# Patient Record
Sex: Female | Born: 1990 | Race: Black or African American | Hispanic: No | Smoking: Never smoker
Health system: Southern US, Community
[De-identification: ages and names within clinical notes are randomized; demographics above are authoritative.]

## PROBLEM LIST (undated history)

## (undated) DIAGNOSIS — D689 Coagulation defect, unspecified: Secondary | ICD-10-CM

## (undated) DIAGNOSIS — D649 Anemia, unspecified: Secondary | ICD-10-CM

## (undated) DIAGNOSIS — D571 Sickle-cell disease without crisis: Secondary | ICD-10-CM

## (undated) HISTORY — DX: Coagulation defect, unspecified: D68.9

## (undated) HISTORY — DX: Anemia, unspecified: D64.9

## (undated) HISTORY — DX: Sickle-cell disease without crisis: D57.1

---

## 2011-04-26 HISTORY — PX: CHOLECYSTECTOMY: SHX55

## 2020-05-29 ENCOUNTER — Ambulatory Visit
Admission: EM | Admit: 2020-05-29 | Discharge: 2020-05-29 | Disposition: A | Discharge status: Home / Self Care | Payer: 59 | Attending: Sports Medicine | Admitting: Sports Medicine

## 2020-05-29 ENCOUNTER — Other Ambulatory Visit: Payer: Self-pay

## 2020-05-29 ENCOUNTER — Encounter: Payer: Self-pay | Admitting: Emergency Medicine

## 2020-05-29 ENCOUNTER — Ambulatory Visit
Admission: RE | Admit: 2020-05-29 | Discharge: 2020-05-29 | Disposition: A | Discharge status: Home / Self Care | Payer: 59 | Source: Ambulatory Visit | Attending: Sports Medicine | Admitting: Sports Medicine

## 2020-05-29 DIAGNOSIS — N200 Calculus of kidney: Secondary | ICD-10-CM | POA: Insufficient documentation

## 2020-05-29 DIAGNOSIS — N920 Excessive and frequent menstruation with regular cycle: Secondary | ICD-10-CM | POA: Insufficient documentation

## 2020-05-29 DIAGNOSIS — Z9049 Acquired absence of other specified parts of digestive tract: Secondary | ICD-10-CM | POA: Insufficient documentation

## 2020-05-29 DIAGNOSIS — D509 Iron deficiency anemia, unspecified: Secondary | ICD-10-CM

## 2020-05-29 DIAGNOSIS — N922 Excessive menstruation at puberty: Secondary | ICD-10-CM | POA: Insufficient documentation

## 2020-05-29 DIAGNOSIS — D219 Benign neoplasm of connective and other soft tissue, unspecified: Secondary | ICD-10-CM | POA: Insufficient documentation

## 2020-05-29 DIAGNOSIS — D259 Leiomyoma of uterus, unspecified: Secondary | ICD-10-CM | POA: Insufficient documentation

## 2020-05-29 DIAGNOSIS — R1031 Right lower quadrant pain: Secondary | ICD-10-CM | POA: Insufficient documentation

## 2020-05-29 LAB — CBC WITH DIFFERENTIAL/PLATELET
Abs Immature Granulocytes: 0.02 10*3/uL (ref 0.00–0.07)
Basophils Absolute: 0 10*3/uL (ref 0.0–0.1)
Basophils Relative: 1 %
Eosinophils Absolute: 0 10*3/uL (ref 0.0–0.5)
Eosinophils Relative: 1 %
HCT: 28.5 % — ABNORMAL LOW (ref 36.0–46.0)
Hemoglobin: 8.5 g/dL — ABNORMAL LOW (ref 12.0–15.0)
Immature Granulocytes: 0 %
Lymphocytes Relative: 30 %
Lymphs Abs: 2.3 10*3/uL (ref 0.7–4.0)
MCH: 19.5 pg — ABNORMAL LOW (ref 26.0–34.0)
MCHC: 29.8 g/dL — ABNORMAL LOW (ref 30.0–36.0)
MCV: 65.4 fL — ABNORMAL LOW (ref 80.0–100.0)
Monocytes Absolute: 0.4 10*3/uL (ref 0.1–1.0)
Monocytes Relative: 6 %
Neutro Abs: 5 10*3/uL (ref 1.7–7.7)
Neutrophils Relative %: 62 %
Platelets: 360 10*3/uL (ref 150–400)
RBC: 4.36 MIL/uL (ref 3.87–5.11)
RDW: 19.5 % — ABNORMAL HIGH (ref 11.5–15.5)
WBC: 7.8 10*3/uL (ref 4.0–10.5)
nRBC: 0 % (ref 0.0–0.2)

## 2020-05-29 LAB — COMPREHENSIVE METABOLIC PANEL
ALT: 14 U/L (ref 0–44)
AST: 13 U/L — ABNORMAL LOW (ref 15–41)
Albumin: 4.3 g/dL (ref 3.5–5.0)
Alkaline Phosphatase: 55 U/L (ref 38–126)
Anion gap: 9 (ref 5–15)
BUN: 14 mg/dL (ref 6–20)
CO2: 24 mmol/L (ref 22–32)
Calcium: 8.8 mg/dL — ABNORMAL LOW (ref 8.9–10.3)
Chloride: 102 mmol/L (ref 98–111)
Creatinine, Ser: 0.81 mg/dL (ref 0.44–1.00)
GFR, Estimated: >60 mL/min (ref >60)
Glucose, Bld: 99 mg/dL (ref 70–99)
Potassium: 3.9 mmol/L (ref 3.5–5.1)
Sodium: 135 mmol/L (ref 135–145)
Total Bilirubin: 0.5 mg/dL (ref 0.3–1.2)
Total Protein: 8.5 g/dL — ABNORMAL HIGH (ref 6.5–8.1)

## 2020-05-29 LAB — URINALYSIS, COMPLETE (UACMP) WITH MICROSCOPIC
Bacteria, UA: NONE SEEN
Bilirubin Urine: NEGATIVE
Glucose, UA: NEGATIVE mg/dL
Ketones, ur: NEGATIVE mg/dL
Leukocytes,Ua: NEGATIVE
Nitrite: NEGATIVE
Protein, ur: NEGATIVE mg/dL
Specific Gravity, Urine: 1.02 (ref 1.005–1.030)
WBC, UA: NONE SEEN WBC/hpf (ref 0–5)
pH: 6 (ref 5.0–8.0)

## 2020-05-29 LAB — PREGNANCY, URINE: Preg Test, Ur: NEGATIVE

## 2020-05-29 MED ORDER — IOHEXOL 300 MG/ML  SOLN
125.0000 mL | Freq: Once | INTRAMUSCULAR | Status: AC | PRN
  Administered 2020-05-29: 125 mL via INTRAVENOUS

## 2020-05-29 NOTE — ED Provider Notes (Signed)
[facility]    CSN: [id number] Arrival date & time: 05/29/20  1608      History   Chief Complaint Chief Complaint  Patient presents with  . Abdominal Pain    HPI Eileen Vargas is a 30 y.o. female.   Patient pleasant 30 year old female who presents for evaluation of 2 days of lower abdominal pain over the suprapubic area.  She reports that it began on Wednesday and has progressively worsened.  No nausea vomiting or diarrhea.  No fever shakes chills.  She has not sought out any medical attention.  She describes the abdominal pain is crampy.  It does not get better or worse with food.  She has had her gallbladder removed in the past.  She still has her appendix.  Her last menstrual period was 2 weeks ago.  She denies current pregnancy.  She denies any pain on urination, dysuria, hematuria, increased frequency or urgency.  She denies vaginal discharge.  She denies a history or current symptoms of an STD.  She recently relocated here from [place] and does not have a primary care physician.  She does not have a current OB/GYN.  She denies any significant medical history.  She reports no medications on a regular basis including birth control.  She does report having heavy periods in the past but they have recently become much heavier.  That said, her lower abdominal pain is not related to her menstrual cycle as she is currently midcycle.  She denies any COVID symptoms.  No red flag signs or symptoms are elicited on history.     History reviewed. No pertinent past medical history.  There are no problems to display for this patient.   Past Surgical History:  Procedure Laterality Date  . CHOLECYSTECTOMY      OB History   No obstetric history on file.      Home Medications    Prior to Admission medications   Not on File    Family History History reviewed. No pertinent family history.  Social History Social History   Tobacco Use  . Smoking status: Never  Smoker  . Smokeless tobacco: Never Used  Vaping Use  . Vaping Use: Never used  Substance Use Topics  . Alcohol use: Yes  . Drug use: Never     Allergies   Patient has no known allergies.   Review of Systems Review of Systems  Constitutional: Negative for activity change, appetite change, chills, fatigue and fever.  HENT: Negative.   Eyes: Negative.   Respiratory: Negative.   Cardiovascular: Negative.   Gastrointestinal: Positive for abdominal pain. Negative for blood in stool, constipation, diarrhea, nausea and vomiting.  Genitourinary: Negative for dysuria, flank pain, frequency, hematuria, pelvic pain, urgency, vaginal bleeding, vaginal discharge and vaginal pain.  Musculoskeletal: Negative.   Skin: Negative.   Neurological: Negative.   All other systems reviewed and are negative.    Physical Exam Triage Vital Signs ED Triage Vitals  Enc Vitals Group     BP 05/29/20 1626 129/83     Pulse Rate 05/29/20 1626 89     Resp 05/29/20 1626 14     Temp 05/29/20 1626 98.7 F (37.1 C)     Temp Source 05/29/20 1626 Oral     SpO2 05/29/20 1626 98 %     Weight 05/29/20 1623 240 lb (108.9 kg)     Height 05/29/20 1623 5\' 4"  (1.626 m)     Head Circumference --  Peak Flow --      Pain Score 05/29/20 1622 8     Pain Loc --      Pain Edu? --      Excl. in [place]? --    No data found.  Updated Vital Signs BP 129/83 (BP Location: Left Arm)   Pulse 89   Temp 98.7 F (37.1 C) (Oral)   Resp 14   Ht 5\' 4"  (1.626 m)   Wt 108.9 kg   LMP 05/15/2020 (Approximate)   SpO2 98%   BMI 41.20 kg/m   Visual Acuity Right Eye Distance:   Left Eye Distance:   Bilateral Distance:    Right Eye Near:   Left Eye Near:    Bilateral Near:     Physical Exam Vitals and nursing note reviewed.  Constitutional:      General: She is not in acute distress.    Appearance: She is well-developed. She is not ill-appearing or toxic-appearing.  HENT:     Head: Normocephalic and atraumatic.      Mouth/Throat:     Mouth: Mucous membranes are moist.     Pharynx: Oropharynx is clear. No pharyngeal swelling or oropharyngeal exudate.  Eyes:     Extraocular Movements: Extraocular movements intact.     Pupils: Pupils are equal, round, and reactive to light.  Cardiovascular:     Rate and Rhythm: Normal rate and regular rhythm.     Heart sounds: Normal heart sounds. No murmur heard. No friction rub. No gallop.   Pulmonary:     Breath sounds: No stridor. No wheezing, rhonchi or rales.  Abdominal:     General: Abdomen is scaphoid. A surgical scar is present. Bowel sounds are increased. There is no distension or abdominal bruit. There are no signs of injury.     Palpations: Abdomen is soft. There is no fluid wave, hepatomegaly or splenomegaly.     Tenderness: There is abdominal tenderness in the right lower quadrant, periumbilical area and suprapubic area. There is guarding and rebound. There is no right CVA tenderness or left CVA tenderness.  Skin:    General: Skin is warm and dry.     Capillary Refill: Capillary refill takes less than 2 seconds.     Coloration: Skin is not jaundiced.  Neurological:     General: No focal deficit present.     Mental Status: She is alert and oriented to person, place, and time.      UC Treatments / Results  Labs (all labs ordered are listed, but only abnormal results are displayed) Labs Reviewed  URINALYSIS, COMPLETE (UACMP) WITH MICROSCOPIC - Abnormal; Notable for the following components:      Result Value   Hgb urine dipstick TRACE (*)    All other components within normal limits  COMPREHENSIVE METABOLIC PANEL - Abnormal; Notable for the following components:   Calcium 8.8 (*)    Total Protein 8.5 (*)    AST 13 (*)    All other components within normal limits  CBC WITH DIFFERENTIAL/PLATELET - Abnormal; Notable for the following components:   Hemoglobin 8.5 (*)    HCT 28.5 (*)    MCV 65.4 (*)    MCH 19.5 (*)    MCHC 29.8 (*)    RDW 19.5  (*)    All other components within normal limits  PREGNANCY, URINE    EKG   Radiology CT ABDOMEN PELVIS W CONTRAST  Result Date: 05/29/2020 CLINICAL DATA:  Right lower quadrant pain EXAM: CT ABDOMEN  AND PELVIS WITH CONTRAST TECHNIQUE: Multidetector CT imaging of the abdomen and pelvis was performed using the standard protocol following bolus administration of intravenous contrast. CONTRAST:  113mL OMNIPAQUE IOHEXOL 300 MG/ML  SOLN COMPARISON:  None. FINDINGS: Lower chest: Lung bases are clear. No effusions. Heart is normal size. Hepatobiliary: No focal liver abnormality is seen. Status post cholecystectomy. No biliary dilatation. Pancreas: No focal abnormality or ductal dilatation. Spleen: No focal abnormality.  Normal size. Adrenals/Urinary Tract: 2 cm cyst off the upper pole of the left kidney. 5 mm stone in the lower pole of the left kidney. No hydronephrosis or ureteral stones. Adrenal glands and urinary bladder unremarkable. Stomach/Bowel: Normal appendix. Stomach, large and small bowel grossly unremarkable. Vascular/Lymphatic: No evidence of aneurysm or adenopathy. Reproductive: Fibroid uterus with largest fibroid measuring 4 cm. No adnexal masses. Other: No free fluid or free air. Musculoskeletal: No acute bony abnormality. IMPRESSION: Left lower pole nephrolithiasis. Normal appendix. Fibroid uterus. No acute findings. Electronically Signed   By: Rolm Baptise M.D.   On: 05/29/2020 20:12    Procedures Procedures (including critical care time)  Medications Ordered in UC Medications - No data to display  Initial Impression / Assessment and Plan / UC Course  I have reviewed the triage vital signs and the nursing notes.  Pertinent labs & imaging results that were available during my care of the patient were reviewed by me and considered in my medical decision making (see chart for details).  Clinical impression: Right lower quadrant pain that goes into the periumbilical area and over the  suprapubic region.  She has had symptoms for 2 days and have progressively worsened.  Treatment plan: 1.  The findings and treatment plan were discussed in detail with the patient.  Patient was in agreement. 2.  I recommended getting a CT scan to further delineate whether or not she has had appendicitis.  We will send her over the hospital for that.  I will call her later with her results. 3.  Her urinalysis is negative for a UTI. 4.  Did a pregnancy test which was also negative. 5.  Her CBC was positive for a significant microcytic anemia with H&H of 8.5 and 28.5 with a MCV of 65.4.  Patient is unaware of any underlying hematological issues.  I will refer her to hematology for further evaluation and management. 6.  Results of her CT scan did show a fibroid uterus.  She is having heavy periods in her history that have recently worsened.  Will refer to OB/GYN for further evaluation and management.  This may also explain her lower abdominal pain. 7.  I have recommended over-the-counter iron with increased fruits and vegetables and advised the patient that she could get constipated as well as having dark stools from the iron.  She voiced verbal understanding. 8.  She will also need a primary care physician and will see if we can have our referral service help her out with that as well. 9.  I will discharge her for now.  When I spoke to her about her results on the telephone and I indicated that if she did get worse she should seek out immediate medical attention in the emergency room and she voiced verbal understanding.    Final Clinical Impressions(s) / UC Diagnoses   Final diagnoses:  Right lower quadrant abdominal pain  Microcytic anemia  Fibroids  Excessive menstruation at puberty     Discharge Instructions     Please go to the hospital to have  your CT scan.  Please wait there until the CT department contact me and I advise you of your results and what the next steps are. There is an  educational handout on abdominal pain. Your urine test was negative for infection and pregnancy.  The pregnancy test was necessary for your CT scan. Your labs show you have significant anemia.  CT scan to rule out not only appendicitis but a GI bleed.  You may need to see hematology but after your CT scan I will advise.    ED Prescriptions    None     PDMP not reviewed this encounter.   Verda Cumins, MD 05/30/20 [phone]

## 2020-05-29 NOTE — Discharge Instructions (Addendum)
Please go to the hospital to have your CT scan.  Please wait there until the CT department contact me and I advise you of your results and what the next steps are. There is an educational handout on abdominal pain. Your urine test was negative for infection and pregnancy.  The pregnancy test was necessary for your CT scan. Your labs show you have significant anemia.  CT scan to rule out not only appendicitis but a GI bleed.  You may need to see hematology but after your CT scan I will advise.

## 2020-05-29 NOTE — ED Triage Notes (Signed)
Patient c/o lower abdominal pain that started on Wed.  Patient denies urinary symptoms.  Patient denies vaginal discharge.  Patient denies any N,V,D.  Patient denies cold symptoms.  Patient denies fevers.

## 2020-05-30 ENCOUNTER — Telehealth: Payer: Self-pay | Admitting: Sports Medicine

## 2020-05-30 NOTE — Telephone Encounter (Signed)
Called and discussed the results directly with the patient.  All questions were encouraged and answered. Recommended over-the-counter iron with increased fruits and fibers given her anemia.  Also recommended hematology referral given her anemia and her MCV of 65.  Also recommended OB/GYN referral given her heavy periods and a fibroid uterus.  Advised defer abdominal pain was to worsen in any way that she should go directly to the emergency room.  She voiced verbal understanding.

## 2020-06-01 NOTE — ED Notes (Signed)
Patient has been approved for CPT [id number]. Obtained via [employer] . Valid for 05/29/2020 through 11/28/2020. Authorization # [id number].

## 2020-06-06 DIAGNOSIS — D509 Iron deficiency anemia, unspecified: Secondary | ICD-10-CM | POA: Insufficient documentation

## 2020-06-06 NOTE — Progress Notes (Signed)
[place]  [phone] Fax:(336) [phone]  ID: Eileen Vargas OB: [birth year]  MR#: [id number]  JKK#:[id number]  Patient Care Team: Patient, No Pcp Per as PCP - General (General Practice)  CHIEF COMPLAINT: Microcytic anemia.  INTERVAL HISTORY: Patient is a 30 year old female who was noted to have a microcytic anemia on routine blood work.  She currently feels well and is asymptomatic.  She does not complain of any weakness or fatigue.  She has a good appetite and denies weight loss.  She has no chest pain, shortness of breath, cough, or hemoptysis.  She denies any nausea, vomiting, constipation, or diarrhea.  She has no melena or hematochezia.  She has no urinary complaints.  Patient feels at her baseline and offers no specific complaints today.  REVIEW OF SYSTEMS:   Review of Systems  Constitutional: Negative.  Negative for fever, malaise/fatigue and weight loss.  Respiratory: Negative.  Negative for cough, hemoptysis and shortness of breath.   Cardiovascular: Negative.  Negative for chest pain and leg swelling.  Gastrointestinal: Negative.  Negative for abdominal pain and blood in stool.  Genitourinary: Negative.  Negative for hematuria.  Musculoskeletal: Negative.  Negative for back pain.  Skin: Negative.  Negative for rash.  Neurological: Negative.  Negative for dizziness, focal weakness, weakness and headaches.  Psychiatric/Behavioral: Negative.  The patient is not nervous/anxious.     As per HPI. Otherwise, a complete review of systems is negative.  PAST MEDICAL HISTORY: History reviewed. No pertinent past medical history.  PAST SURGICAL HISTORY: Past Surgical History:  Procedure Laterality Date  . CHOLECYSTECTOMY      FAMILY HISTORY: Family History  Problem Relation Age of Onset  . Diabetes Maternal Grandmother   . Lung cancer Maternal Grandfather     ADVANCED DIRECTIVES (Y/N):  N  HEALTH MAINTENANCE: Social History   Tobacco Use   . Smoking status: Never Smoker  . Smokeless tobacco: Never Used  Vaping Use  . Vaping Use: Never used  Substance Use Topics  . Alcohol use: Yes  . Drug use: Never     Colonoscopy:  PAP:  Bone density:  Lipid panel:  No Known Allergies  No current outpatient medications on file.   No current facility-administered medications for this visit.    OBJECTIVE: Vitals:   06/11/20 1357  BP: 122/81  Pulse: 78  Resp: 16  Temp: (!) 96.8 F (36 C)     Body mass index is 41.88 kg/m.    ECOG FS:0 - Asymptomatic  General: Well-developed, well-nourished, no acute distress. Eyes: Pink conjunctiva, anicteric sclera. HEENT: Normocephalic, moist mucous membranes. Lungs: No audible wheezing or coughing. Heart: Regular rate and rhythm. Abdomen: Soft, nontender, no obvious distention. Musculoskeletal: No edema, cyanosis, or clubbing. Neuro: Alert, answering all questions appropriately. Cranial nerves grossly intact. Skin: No rashes or petechiae noted. Psych: Normal affect. Lymphatics: No cervical, calvicular, axillary or inguinal LAD.   LAB RESULTS:  Lab Results  Component Value Date   NA 135 05/29/2020   K 3.9 05/29/2020   CL 102 05/29/2020   CO2 24 05/29/2020   GLUCOSE 99 05/29/2020   BUN 14 05/29/2020   CREATININE 0.81 05/29/2020   CALCIUM 8.8 (L) 05/29/2020   PROT 8.5 (H) 05/29/2020   ALBUMIN 4.3 05/29/2020   AST 13 (L) 05/29/2020   ALT 14 05/29/2020   ALKPHOS 55 05/29/2020   BILITOT 0.5 05/29/2020   GFRNONAA >60 05/29/2020    Lab Results  Component Value Date   WBC 5.9 06/11/2020  NEUTROABS 5.0 05/29/2020   HGB 9.0 (L) 06/11/2020   HCT 30.5 (L) 06/11/2020   MCV 69.6 (L) 06/11/2020   PLT 317 06/11/2020   Lab Results  Component Value Date   IRON 19 (L) 06/11/2020   TIBC 449 06/11/2020   IRONPCTSAT 4 (L) 06/11/2020   Lab Results  Component Value Date   FERRITIN 8 (L) 06/11/2020     STUDIES: CT ABDOMEN PELVIS W CONTRAST  Result Date:  05/29/2020 CLINICAL DATA:  Right lower quadrant pain EXAM: CT ABDOMEN AND PELVIS WITH CONTRAST TECHNIQUE: Multidetector CT imaging of the abdomen and pelvis was performed using the standard protocol following bolus administration of intravenous contrast. CONTRAST:  180mL OMNIPAQUE IOHEXOL 300 MG/ML  SOLN COMPARISON:  None. FINDINGS: Lower chest: Lung bases are clear. No effusions. Heart is normal size. Hepatobiliary: No focal liver abnormality is seen. Status post cholecystectomy. No biliary dilatation. Pancreas: No focal abnormality or ductal dilatation. Spleen: No focal abnormality.  Normal size. Adrenals/Urinary Tract: 2 cm cyst off the upper pole of the left kidney. 5 mm stone in the lower pole of the left kidney. No hydronephrosis or ureteral stones. Adrenal glands and urinary bladder unremarkable. Stomach/Bowel: Normal appendix. Stomach, large and small bowel grossly unremarkable. Vascular/Lymphatic: No evidence of aneurysm or adenopathy. Reproductive: Fibroid uterus with largest fibroid measuring 4 cm. No adnexal masses. Other: No free fluid or free air. Musculoskeletal: No acute bony abnormality. IMPRESSION: Left lower pole nephrolithiasis. Normal appendix. Fibroid uterus. No acute findings. Electronically Signed   By: Rolm Baptise M.D.   On: 05/29/2020 20:12    ASSESSMENT: Microcytic anemia.  PLAN:    1. Microcytic anemia: Patient noted to have reduced hemoglobin and iron stores.  There is no evidence of hemolysis and B12 and folate are within normal limits.  Erythropoietin level is appropriately elevated.  Patient will return to clinic in 2 to 3 weeks for further evaluation and initiation of IV Venofer.  I spent a total of 45 minutes reviewing chart data, face-to-face evaluation with the patient, counseling and coordination of care as detailed above.   Patient expressed understanding and was in agreement with this plan. She also understands that She can call clinic at any time with any  questions, concerns, or complaints.   Cancer Staging No matching staging information was found for the patient.  Lloyd Huger, MD   06/13/2020 7:44 AM

## 2020-06-11 ENCOUNTER — Encounter: Payer: Self-pay | Admitting: Oncology

## 2020-06-11 ENCOUNTER — Inpatient Hospital Stay: Payer: 59 | Attending: Oncology | Admitting: Oncology

## 2020-06-11 ENCOUNTER — Inpatient Hospital Stay: Payer: 59

## 2020-06-11 DIAGNOSIS — Z801 Family history of malignant neoplasm of trachea, bronchus and lung: Secondary | ICD-10-CM | POA: Diagnosis not present

## 2020-06-11 DIAGNOSIS — D259 Leiomyoma of uterus, unspecified: Secondary | ICD-10-CM | POA: Diagnosis not present

## 2020-06-11 DIAGNOSIS — N200 Calculus of kidney: Secondary | ICD-10-CM | POA: Diagnosis not present

## 2020-06-11 DIAGNOSIS — D509 Iron deficiency anemia, unspecified: Secondary | ICD-10-CM | POA: Insufficient documentation

## 2020-06-11 LAB — FOLATE: Folate: 18.4 ng/mL (ref >5.9)

## 2020-06-11 LAB — RETICULOCYTES
Immature Retic Fract: 35.5 % — ABNORMAL HIGH (ref 2.3–15.9)
RBC.: 4.45 MIL/uL (ref 3.87–5.11)
Retic Count, Absolute: 158.4 10*3/uL (ref 19.0–186.0)
Retic Ct Pct: 3.6 % — ABNORMAL HIGH (ref 0.4–3.1)

## 2020-06-11 LAB — FERRITIN: Ferritin: 8 ng/mL — ABNORMAL LOW (ref 11–307)

## 2020-06-11 LAB — CBC
HCT: 30.5 % — ABNORMAL LOW (ref 36.0–46.0)
Hemoglobin: 9 g/dL — ABNORMAL LOW (ref 12.0–15.0)
MCH: 20.5 pg — ABNORMAL LOW (ref 26.0–34.0)
MCHC: 29.5 g/dL — ABNORMAL LOW (ref 30.0–36.0)
MCV: 69.6 fL — ABNORMAL LOW (ref 80.0–100.0)
Platelets: 317 10*3/uL (ref 150–400)
RBC: 4.38 MIL/uL (ref 3.87–5.11)
RDW: 23.8 % — ABNORMAL HIGH (ref 11.5–15.5)
WBC: 5.9 10*3/uL (ref 4.0–10.5)
nRBC: 0 % (ref 0.0–0.2)

## 2020-06-11 LAB — IRON AND TIBC
Iron: 19 ug/dL — ABNORMAL LOW (ref 28–170)
Saturation Ratios: 4 % — ABNORMAL LOW (ref 10.4–31.8)
TIBC: 449 ug/dL (ref 250–450)
UIBC: 430 ug/dL

## 2020-06-11 LAB — LACTATE DEHYDROGENASE: LDH: 118 U/L (ref 98–192)

## 2020-06-11 LAB — VITAMIN B12: Vitamin B-12: 469 pg/mL (ref 180–914)

## 2020-06-11 NOTE — Progress Notes (Signed)
Pt referred by Dr Drema Dallas for microcytic anemia.

## 2020-06-12 LAB — ERYTHROPOIETIN: Erythropoietin: 77.1 m[IU]/mL — ABNORMAL HIGH (ref 2.6–18.5)

## 2020-06-15 LAB — HGB SOLUBILITY: Hgb Solubility: POSITIVE — AB

## 2020-06-15 LAB — HGB FRACTIONATION CASCADE
Hgb A2: 2.3 % (ref 1.8–3.2)
Hgb A: 62.1 % — ABNORMAL LOW (ref 96.4–98.8)
Hgb F: 0 % (ref 0.0–2.0)
Hgb S: 35.6 % — ABNORMAL HIGH

## 2020-06-26 ENCOUNTER — Other Ambulatory Visit: Payer: Self-pay

## 2020-06-26 DIAGNOSIS — D509 Iron deficiency anemia, unspecified: Secondary | ICD-10-CM

## 2020-06-27 NOTE — Progress Notes (Signed)
[place]  Telephone:(336) [phone] Fax:(336) [phone]  ID: Eileen Vargas OB: October 20, 1990  MR#: [id number]  NGE#:[id number]  Patient Care Team: Patient, No Pcp Per as PCP - General (General Practice)  CHIEF COMPLAINT: Iron deficiency anemia.  INTERVAL HISTORY: Patient returns to clinic today for further evaluation, discussion of her laboratory results, and initiation of IV Venofer.  She continues to feel well and remains asymptomatic. She does not complain of any weakness or fatigue.  She has a good appetite and denies weight loss.  She has no chest pain, shortness of breath, cough, or hemoptysis.  She denies any nausea, vomiting, constipation, or diarrhea.  She has no melena or hematochezia.  She has no urinary complaints.  Patient offers no specific complaints today.  REVIEW OF SYSTEMS:   Review of Systems  Constitutional: Negative.  Negative for fever, malaise/fatigue and weight loss.  Respiratory: Negative.  Negative for cough, hemoptysis and shortness of breath.   Cardiovascular: Negative.  Negative for chest pain and leg swelling.  Gastrointestinal: Negative.  Negative for abdominal pain and blood in stool.  Genitourinary: Negative.  Negative for hematuria.  Musculoskeletal: Negative.  Negative for back pain.  Skin: Negative.  Negative for rash.  Neurological: Negative.  Negative for dizziness, focal weakness, weakness and headaches.  Psychiatric/Behavioral: Negative.  The patient is not nervous/anxious.     As per HPI. Otherwise, a complete review of systems is negative.  PAST MEDICAL HISTORY: History reviewed. No pertinent past medical history.  PAST SURGICAL HISTORY: Past Surgical History:  Procedure Laterality Date   CHOLECYSTECTOMY      FAMILY HISTORY: Family History  Problem Relation Age of Onset   Diabetes Maternal Grandmother    Lung cancer Maternal Grandfather     ADVANCED DIRECTIVES (Y/N):  N  HEALTH MAINTENANCE: Social History    Tobacco Use   Smoking status: Never Smoker   Smokeless tobacco: Never Used  Vaping Use   Vaping Use: Never used  Substance Use Topics   Alcohol use: Yes   Drug use: Never     Colonoscopy:  PAP:  Bone density:  Lipid panel:  No Known Allergies  No current outpatient medications on file.   No current facility-administered medications for this visit.    OBJECTIVE: Vitals:   06/30/20 1334  BP: 120/78  Pulse: 79  Resp: 18  Temp: 98 F (36.7 C)     Body mass index is 41.2 kg/m.    ECOG FS:0 - Asymptomatic  General: Well-developed, well-nourished, no acute distress. Eyes: Pink conjunctiva, anicteric sclera. HEENT: Normocephalic, moist mucous membranes. Lungs: No audible wheezing or coughing. Heart: Regular rate and rhythm. Abdomen: Soft, nontender, no obvious distention. Musculoskeletal: No edema, cyanosis, or clubbing. Neuro: Alert, answering all questions appropriately. Cranial nerves grossly intact. Skin: No rashes or petechiae noted. Psych: Normal affect.  LAB RESULTS:  Lab Results  Component Value Date   NA 135 05/29/2020   K 3.9 05/29/2020   CL 102 05/29/2020   CO2 24 05/29/2020   GLUCOSE 99 05/29/2020   BUN 14 05/29/2020   CREATININE 0.81 05/29/2020   CALCIUM 8.8 (L) 05/29/2020   PROT 8.5 (H) 05/29/2020   ALBUMIN 4.3 05/29/2020   AST 13 (L) 05/29/2020   ALT 14 05/29/2020   ALKPHOS 55 05/29/2020   BILITOT 0.5 05/29/2020   GFRNONAA >60 05/29/2020    Lab Results  Component Value Date   WBC 5.1 06/30/2020   NEUTROABS 2.3 06/30/2020   HGB 9.5 (L) 06/30/2020   HCT 31.0 (  L) 06/30/2020   MCV 72.6 (L) 06/30/2020   PLT 290 06/30/2020   Lab Results  Component Value Date   IRON 24 (L) 06/30/2020   TIBC 417 06/30/2020   IRONPCTSAT 6 (L) 06/30/2020   Lab Results  Component Value Date   FERRITIN 13 06/30/2020     STUDIES: No results found.  ASSESSMENT: Iron deficiency anemia.  PLAN:    1.  Iron deficiency anemia: Patient noted to  have reduced hemoglobin and iron stores.  All of her other laboratory work was either negative or within normal limits.  Proceed with 200 mg IV Venofer today.  Patient will then receive 4 infusions over the next several weeks.  Return to clinic in 4 months with repeat laboratory work, further evaluation, and consideration of additional treatment if needed. 2.  Sickle cell trait: No intervention needed.  She does not have any children at this time.  No intervention is needed.  Patient may have a mild baseline anemia secondary to this.  IV Venofer as above.  I spent a total of 30 minutes reviewing chart data, face-to-face evaluation with the patient, counseling and coordination of care as detailed above.   Patient expressed understanding and was in agreement with this plan. She also understands that She can call clinic at any time with any questions, concerns, or complaints.    Lloyd Huger, MD   07/01/2020 6:24 AM

## 2020-06-30 ENCOUNTER — Encounter: Payer: Self-pay | Admitting: Oncology

## 2020-06-30 ENCOUNTER — Inpatient Hospital Stay: Payer: 59 | Attending: Oncology

## 2020-06-30 ENCOUNTER — Inpatient Hospital Stay: Payer: 59

## 2020-06-30 ENCOUNTER — Inpatient Hospital Stay (HOSPITAL_BASED_OUTPATIENT_CLINIC_OR_DEPARTMENT_OTHER): Payer: 59 | Admitting: Oncology

## 2020-06-30 VITALS — BP 120/78 | HR 79 | Temp 98.0°F | Resp 18 | Wt 240.0 lb

## 2020-06-30 VITALS — BP 106/70 | HR 69 | Temp 97.5°F | Resp 18

## 2020-06-30 DIAGNOSIS — D509 Iron deficiency anemia, unspecified: Secondary | ICD-10-CM

## 2020-06-30 DIAGNOSIS — D573 Sickle-cell trait: Secondary | ICD-10-CM | POA: Insufficient documentation

## 2020-06-30 DIAGNOSIS — Z79899 Other long term (current) drug therapy: Secondary | ICD-10-CM | POA: Diagnosis not present

## 2020-06-30 LAB — CBC WITH DIFFERENTIAL/PLATELET
Abs Immature Granulocytes: 0.02 10*3/uL (ref 0.00–0.07)
Basophils Absolute: 0.1 10*3/uL (ref 0.0–0.1)
Basophils Relative: 1 %
Eosinophils Absolute: 0.1 10*3/uL (ref 0.0–0.5)
Eosinophils Relative: 1 %
HCT: 31 % — ABNORMAL LOW (ref 36.0–46.0)
Hemoglobin: 9.5 g/dL — ABNORMAL LOW (ref 12.0–15.0)
Immature Granulocytes: 0 %
Lymphocytes Relative: 44 %
Lymphs Abs: 2.2 10*3/uL (ref 0.7–4.0)
MCH: 22.2 pg — ABNORMAL LOW (ref 26.0–34.0)
MCHC: 30.6 g/dL (ref 30.0–36.0)
MCV: 72.6 fL — ABNORMAL LOW (ref 80.0–100.0)
Monocytes Absolute: 0.4 10*3/uL (ref 0.1–1.0)
Monocytes Relative: 8 %
Neutro Abs: 2.3 10*3/uL (ref 1.7–7.7)
Neutrophils Relative %: 46 %
Platelets: 290 10*3/uL (ref 150–400)
RBC: 4.27 MIL/uL (ref 3.87–5.11)
RDW: 24.2 % — ABNORMAL HIGH (ref 11.5–15.5)
WBC: 5.1 10*3/uL (ref 4.0–10.5)
nRBC: 0 % (ref 0.0–0.2)

## 2020-06-30 LAB — IRON AND TIBC
Iron: 24 ug/dL — ABNORMAL LOW (ref 28–170)
Saturation Ratios: 6 % — ABNORMAL LOW (ref 10.4–31.8)
TIBC: 417 ug/dL (ref 250–450)
UIBC: 393 ug/dL

## 2020-06-30 LAB — FERRITIN: Ferritin: 13 ng/mL (ref 11–307)

## 2020-06-30 MED ORDER — IRON SUCROSE 20 MG/ML IV SOLN
200.0000 mg | Freq: Once | INTRAVENOUS | Status: AC
  Administered 2020-06-30: 200 mg via INTRAVENOUS
  Filled 2020-06-30: qty 10

## 2020-06-30 MED ORDER — SODIUM CHLORIDE 0.9 % IV SOLN: 200.0000 mg | Freq: Once | INTRAVENOUS | Status: DC

## 2020-06-30 MED ORDER — SODIUM CHLORIDE 0.9 % IV SOLN
Freq: Once | INTRAVENOUS | Status: AC
  Filled 2020-06-30: qty 250

## 2020-06-30 NOTE — Progress Notes (Signed)
Pt tolerated infusion well. No s/s of distress or reaction noted. Pt and VS stable at discharge.  

## 2020-06-30 NOTE — Progress Notes (Signed)
Patient denies any concerns today.  

## 2020-07-01 DIAGNOSIS — D573 Sickle-cell trait: Secondary | ICD-10-CM | POA: Insufficient documentation

## 2020-07-06 ENCOUNTER — Inpatient Hospital Stay: Payer: 59

## 2020-07-06 VITALS — BP 117/70 | HR 70 | Temp 99.5°F | Resp 18

## 2020-07-06 DIAGNOSIS — D508 Other iron deficiency anemias: Secondary | ICD-10-CM

## 2020-07-06 DIAGNOSIS — D509 Iron deficiency anemia, unspecified: Secondary | ICD-10-CM | POA: Diagnosis not present

## 2020-07-06 MED ORDER — SODIUM CHLORIDE 0.9 % IV SOLN: 200.0000 mg | Freq: Once | INTRAVENOUS | Status: DC

## 2020-07-06 MED ORDER — IRON SUCROSE 20 MG/ML IV SOLN
200.0000 mg | Freq: Once | INTRAVENOUS | Status: AC
  Administered 2020-07-06: 200 mg via INTRAVENOUS
  Filled 2020-07-06: qty 10

## 2020-07-06 MED ORDER — SODIUM CHLORIDE 0.9 % IV SOLN
Freq: Once | INTRAVENOUS | Status: AC
  Filled 2020-07-06: qty 250

## 2020-07-13 ENCOUNTER — Ambulatory Visit: Payer: 59 | Admitting: Family Medicine

## 2020-07-13 ENCOUNTER — Encounter: Payer: Self-pay | Admitting: Family Medicine

## 2020-07-13 ENCOUNTER — Inpatient Hospital Stay: Payer: 59

## 2020-07-13 ENCOUNTER — Other Ambulatory Visit: Payer: Self-pay

## 2020-07-13 VITALS — BP 116/78 | HR 84 | Temp 98.3°F | Ht 64.0 in | Wt 266.0 lb

## 2020-07-13 VITALS — BP 144/83 | HR 76 | Temp 97.8°F

## 2020-07-13 DIAGNOSIS — D219 Benign neoplasm of connective and other soft tissue, unspecified: Secondary | ICD-10-CM | POA: Diagnosis not present

## 2020-07-13 DIAGNOSIS — Z0000 Encounter for general adult medical examination without abnormal findings: Secondary | ICD-10-CM

## 2020-07-13 DIAGNOSIS — K219 Gastro-esophageal reflux disease without esophagitis: Secondary | ICD-10-CM | POA: Diagnosis not present

## 2020-07-13 DIAGNOSIS — D259 Leiomyoma of uterus, unspecified: Secondary | ICD-10-CM | POA: Insufficient documentation

## 2020-07-13 DIAGNOSIS — Z6841 Body Mass Index (BMI) 40.0 and over, adult: Secondary | ICD-10-CM

## 2020-07-13 DIAGNOSIS — F419 Anxiety disorder, unspecified: Secondary | ICD-10-CM

## 2020-07-13 DIAGNOSIS — D508 Other iron deficiency anemias: Secondary | ICD-10-CM

## 2020-07-13 DIAGNOSIS — D509 Iron deficiency anemia, unspecified: Secondary | ICD-10-CM | POA: Diagnosis not present

## 2020-07-13 DIAGNOSIS — E66813 Obesity, class 3: Secondary | ICD-10-CM | POA: Insufficient documentation

## 2020-07-13 DIAGNOSIS — F32A Depression, unspecified: Secondary | ICD-10-CM

## 2020-07-13 MED ORDER — IRON SUCROSE 20 MG/ML IV SOLN
200.0000 mg | Freq: Once | INTRAVENOUS | Status: AC
  Administered 2020-07-13: 200 mg via INTRAVENOUS
  Filled 2020-07-13: qty 10

## 2020-07-13 MED ORDER — SODIUM CHLORIDE 0.9 % IV SOLN
Freq: Once | INTRAVENOUS | Status: AC
  Filled 2020-07-13: qty 250

## 2020-07-13 MED ORDER — SODIUM CHLORIDE 0.9 % IV SOLN: 200.0000 mg | Freq: Once | INTRAVENOUS | Status: DC

## 2020-07-13 MED ORDER — OMEPRAZOLE 20 MG PO CPDR: 20.0000 mg | DELAYED_RELEASE_CAPSULE | Freq: Every day | ORAL | 1 refills | Status: DC

## 2020-07-13 NOTE — Patient Instructions (Addendum)
- Dose omeprazole daily 30 minutes before food - Modify foods & caffeine intake as discussed - Obtain fasting blood work - Return in 1 month - Sport and exercise psychologist for questions Conn's Current Therapy 2021 (pp. 213-216). [place], [place]: Elsevier.">  Gastroesophageal Reflux Disease, Adult Gastroesophageal reflux (GER) happens when acid from the stomach flows up into the tube that connects the mouth and the stomach (esophagus). Normally, food travels down the esophagus and stays in the stomach to be digested. However, when a person has GER, food and stomach acid sometimes move back up into the esophagus. If this becomes a more serious problem, the person may be diagnosed with a disease called gastroesophageal reflux disease (GERD). GERD occurs when the reflux:  Happens often.  Causes frequent or severe symptoms.  Causes problems such as damage to the esophagus. When stomach acid comes in contact with the esophagus, the acid may cause inflammation in the esophagus. Over time, GERD may create small holes (ulcers) in the lining of the esophagus. What are the causes? This condition is caused by a problem with the muscle between the esophagus and the stomach (lower esophageal sphincter, or LES). Normally, the LES muscle closes after food passes through the esophagus to the stomach. When the LES is weakened or abnormal, it does not close properly, and that allows food and stomach acid to go back up into the esophagus. The LES can be weakened by certain dietary substances, medicines, and medical conditions, including:  Tobacco use.  Pregnancy.  Having a hiatal hernia.  Alcohol use.  Certain foods and beverages, such as coffee, chocolate, onions, and peppermint. What increases the risk? You are more likely to develop this condition if you:  Have an increased body weight.  Have a connective tissue disorder.  Take NSAIDs, such as ibuprofen. What are the signs or symptoms? Symptoms of this condition  include:  Heartburn.  Difficult or painful swallowing and the feeling of having a lump in the throat.  A bitter taste in the mouth.  Bad breath and having a large amount of saliva.  Having an upset or bloated stomach and belching.  Chest pain. Different conditions can cause chest pain. Make sure you see your health care provider if you experience chest pain.  Shortness of breath or wheezing.  Ongoing (chronic) cough or a nighttime cough.  Wearing away of tooth enamel.  Weight loss. How is this diagnosed? This condition may be diagnosed based on a medical history and a physical exam. To determine if you have mild or severe GERD, your health care provider may also monitor how you respond to treatment. You may also have tests, including:  A test to examine your stomach and esophagus with a small camera (endoscopy).  A test that measures the acidity level in your esophagus.  A test that measures how much pressure is on your esophagus.  A barium swallow or modified barium swallow test to show the shape, size, and functioning of your esophagus. How is this treated? Treatment for this condition may vary depending on how severe your symptoms are. Your health care provider may recommend:  Changes to your diet.  Medicine.  Surgery. The goal of treatment is to help relieve your symptoms and to prevent complications. Follow these instructions at home: Eating and drinking  Follow a diet as recommended by your health care provider. This may involve avoiding foods and drinks such as: ? Coffee and tea, with or without caffeine. ? Drinks that contain alcohol. ? Energy drinks and sports  drinks. ? Carbonated drinks or sodas. ? Chocolate and cocoa. ? Peppermint and mint flavorings. ? Garlic and onions. ? Horseradish. ? Spicy and acidic foods, including peppers, chili powder, curry powder, vinegar, hot sauces, and barbecue sauce. ? Citrus fruit juices and citrus fruits, such as  oranges, lemons, and limes. ? Tomato-based foods, such as red sauce, chili, salsa, and pizza with red sauce. ? Fried and fatty foods, such as donuts, french fries, potato chips, and high-fat dressings. ? High-fat meats, such as hot dogs and fatty cuts of red and white meats, such as rib eye steak, sausage, ham, and bacon. ? High-fat dairy items, such as whole milk, butter, and cream cheese.  Eat small, frequent meals instead of large meals.  Avoid drinking large amounts of liquid with your meals.  Avoid eating meals during the 2-3 hours before bedtime.  Avoid lying down right after you eat.  Do not exercise right after you eat.   Lifestyle  Do not use any products that contain nicotine or tobacco. These products include cigarettes, chewing tobacco, and vaping devices, such as e-cigarettes. If you need help quitting, ask your health care provider.  Try to reduce your stress by using methods such as yoga or meditation. If you need help reducing stress, ask your health care provider.  If you are overweight, reduce your weight to an amount that is healthy for you. Ask your health care provider for guidance about a safe weight loss goal.   General instructions  Pay attention to any changes in your symptoms.  Take over-the-counter and prescription medicines only as told by your health care provider. Do not take aspirin, ibuprofen, or other NSAIDs unless your health care provider told you to take these medicines.  Wear loose-fitting clothing. Do not wear anything tight around your waist that causes pressure on your abdomen.  Raise (elevate) the head of your bed about 6 inches (15 cm). You can use a wedge to do this.  Avoid bending over if this makes your symptoms worse.  Keep all follow-up visits. This is important. Contact a health care provider if:  You have: ? New symptoms. ? Unexplained weight loss. ? Difficulty swallowing or it hurts to swallow. ? Wheezing or a persistent  cough. ? A hoarse voice.  Your symptoms do not improve with treatment. Get help right away if:  You have sudden pain in your arms, neck, jaw, teeth, or back.  You suddenly feel sweaty, dizzy, or light-headed.  You have chest pain or shortness of breath.  You vomit and the vomit is green, yellow, or black, or it looks like blood or coffee grounds.  You faint.  You have stool that is red, bloody, or black.  You cannot swallow, drink, or eat. These symptoms may represent a serious problem that is an emergency. Do not wait to see if the symptoms will go away. Get medical help right away. Call your local emergency services (911 in the [place] Do not drive yourself to the hospital. Summary  Gastroesophageal reflux happens when acid from the stomach flows up into the esophagus. GERD is a disease in which the reflux happens often, causes frequent or severe symptoms, or causes problems such as damage to the esophagus.  Treatment for this condition may vary depending on how severe your symptoms are. Your health care provider may recommend diet and lifestyle changes, medicine, or surgery.  Contact a health care provider if you have new or worsening symptoms.  Take over-the-counter and prescription medicines  only as told by your health care provider. Do not take aspirin, ibuprofen, or other NSAIDs unless your health care provider told you to do so.  Keep all follow-up visits as told by your health care provider. This is important. This information is not intended to replace advice given to you by your health care provider. Make sure you discuss any questions you have with your health care provider. Document Revised: 10/21/2019 Document Reviewed: 10/21/2019 Elsevier Patient Education  [place].

## 2020-07-14 ENCOUNTER — Telehealth: Payer: Self-pay

## 2020-07-14 LAB — HEMOGLOBIN A1C
Est. average glucose Bld gHb Est-mCnc: 100 mg/dL
Hgb A1c MFr Bld: 5.1 % (ref 4.8–5.6)

## 2020-07-14 LAB — COMPREHENSIVE METABOLIC PANEL
ALT: 12 IU/L (ref 0–32)
AST: 13 IU/L (ref 0–40)
Albumin/Globulin Ratio: 1.5 (ref 1.2–2.2)
Albumin: 4.3 g/dL (ref 3.8–4.8)
Alkaline Phosphatase: 87 IU/L (ref 44–121)
BUN/Creatinine Ratio: 10 (ref 9–23)
BUN: 8 mg/dL (ref 6–20)
Bilirubin Total: <0.2 mg/dL (ref 0.0–1.2)
CO2: 20 mmol/L (ref 20–29)
Calcium: 9.5 mg/dL (ref 8.7–10.2)
Chloride: 103 mmol/L (ref 96–106)
Creatinine, Ser: 0.77 mg/dL (ref 0.57–1.00)
Globulin, Total: 2.9 g/dL (ref 1.5–4.5)
Glucose: 116 mg/dL — ABNORMAL HIGH (ref 65–99)
Potassium: 4.2 mmol/L (ref 3.5–5.2)
Sodium: 141 mmol/L (ref 134–144)
Total Protein: 7.2 g/dL (ref 6.0–8.5)
eGFR: 106 mL/min/{1.73_m2} (ref >59)

## 2020-07-14 LAB — LIPID PANEL
Chol/HDL Ratio: 3.7 ratio (ref 0.0–4.4)
Cholesterol, Total: 176 mg/dL (ref 100–199)
HDL: 47 mg/dL (ref >39)
LDL Chol Calc (NIH): 98 mg/dL (ref 0–99)
Triglycerides: 178 mg/dL — ABNORMAL HIGH (ref 0–149)
VLDL Cholesterol Cal: 31 mg/dL (ref 5–40)

## 2020-07-14 LAB — TSH: TSH: 1.77 u[IU]/mL (ref 0.450–4.500)

## 2020-07-14 LAB — LIPASE: Lipase: 19 U/L (ref 14–72)

## 2020-07-14 NOTE — Telephone Encounter (Signed)
Patient stated first and last name on voicemail.  Left detailed message regarding lab results.  Advised patient on voicemail that results are also viewable in her MyChart and to call or send a MyChart message if she has any questions regarding the labs.  Referrals pending for [facility].  For your information.

## 2020-07-14 NOTE — Assessment & Plan Note (Signed)
Patient with GAD-7 in normal range, PHQ positive.  I reviewed various treatment strategies to include possible cognitive behavioral therapy and/or pharmacotherapy.  At this stage she is wishing to pursue cognitive behavioral therapy and we have placed a referral accordingly.  We can follow-up on this issue on an as-needed basis.

## 2020-07-14 NOTE — Assessment & Plan Note (Signed)
>>  ASSESSMENT AND PLAN FOR FIBROID WRITTEN ON 07/14/2020  9:09 AM BY Jim Lundin J, MD  Patient with CT evidence of fibroid, she is also symptomatic, well woman check pending.  Referral to OB/GYN has been placed who will follow up for these issues.

## 2020-07-14 NOTE — Progress Notes (Addendum)
New Patient Office Visit  Subjective:  Patient ID: Eileen Vargas, female    DOB: 08-07-1990  Age: 30 y.o. MRN: [id number]  CC:  Chief Complaint  Patient presents with  . New Patient (Initial Visit)   Eileen Vargas is a 30 y.o. female who presents today for her Complete Annual Exam. She feels fairly well. She does not report exercising. She reports she is sleeping fairly well. No breast complaints reported.  Mammogram: never DEXA: never Pap smear: uncertain Colonoscopy: never  Immunization History  Administered Date(s) Administered  . Influenza-Unspecified 06/17/2020  . PFIZER Comirnaty(Gray Top)Covid-19 Tri-Sucrose Vaccine 11/09/2019, 11/30/2019, 06/17/2020    HPI Eileen Vargas presents for establishment of care, routine physical exam, but does bring up additional concerns such as ongoing reflux, new onset over the past 1 year, history of fibroid noted on CT abdomen pelvis from earlier this year during evaluation of right lower quadrant pain, as well as feelings of anxiety and depression.  Past Medical History:  Diagnosis Date  . Anemia     Past Surgical History:  Procedure Laterality Date  . CHOLECYSTECTOMY  2013    Family History  Problem Relation Age of Onset  . Diabetes Maternal Grandmother   . Lung cancer Maternal Grandfather   . Kidney disease Mother   . Clotting disorder Father   . Sickle cell trait Father     Social History   Socioeconomic History  . Marital status: Significant Other    Spouse name: Margart Sickles  . Number of children: 0  . Years of education: 91  . Highest education level: Associate degree: occupational, Hotel manager, or vocational program  Occupational History  . Not on file  Tobacco Use  . Smoking status: Never Smoker  . Smokeless tobacco: Never Used  Vaping Use  . Vaping Use: Never used  Substance and Sexual Activity  . Alcohol use: Yes    Alcohol/week: 2.0 standard drinks    Types: 1 Glasses of wine, 1 Shots of liquor per week   . Drug use: Never  . Sexual activity: Yes  Other Topics Concern  . Not on file  Social History Narrative  . Not on file   Social Determinants of Health   Financial Resource Strain: Not on file  Food Insecurity: Not on file  Transportation Needs: Not on file  Physical Activity: Not on file  Stress: Not on file  Social Connections: Not on file  Intimate Partner Violence: Not on file    ROS Review of Systems  Constitutional: Negative for activity change, appetite change, chills, fever and unexpected weight change.  HENT: Negative.   Eyes: Negative.   Respiratory: Negative for apnea, cough and shortness of breath.   Cardiovascular: Negative for chest pain, palpitations and leg swelling.  Gastrointestinal: Negative for abdominal pain, constipation, diarrhea, nausea and vomiting.       +reflux  Endocrine: Negative.   Genitourinary: Positive for menstrual problem. Negative for flank pain, hematuria and vaginal bleeding.  Musculoskeletal: Negative.   Skin: Negative.   Allergic/Immunologic: Negative.   Neurological: Negative.   Psychiatric/Behavioral: Positive for dysphoric mood and sleep disturbance. Negative for self-injury. The patient is nervous/anxious.     Objective:    CLINICAL DATA:  Right lower quadrant pain  EXAM: CT ABDOMEN AND PELVIS WITH CONTRAST  TECHNIQUE: Multidetector CT imaging of the abdomen and pelvis was performed using the standard protocol following bolus administration of intravenous contrast.  CONTRAST:  183mL OMNIPAQUE IOHEXOL 300 MG/ML  SOLN  COMPARISON:  None.  FINDINGS: Lower chest: Lung bases are clear. No effusions. Heart is normal size.  Hepatobiliary: No focal liver abnormality is seen. Status post cholecystectomy. No biliary dilatation.  Pancreas: No focal abnormality or ductal dilatation.  Spleen: No focal abnormality.  Normal size.  Adrenals/Urinary Tract: 2 cm cyst off the upper pole of the left kidney. 5 mm stone  in the lower pole of the left kidney. No hydronephrosis or ureteral stones. Adrenal glands and urinary bladder unremarkable.  Stomach/Bowel: Normal appendix. Stomach, large and small bowel grossly unremarkable.  Vascular/Lymphatic: No evidence of aneurysm or adenopathy.  Reproductive: Fibroid uterus with largest fibroid measuring 4 cm. No adnexal masses.  Other: No free fluid or free air.  Musculoskeletal: No acute bony abnormality.  IMPRESSION: Left lower pole nephrolithiasis.  Normal appendix.  Fibroid uterus.  No acute findings.   Electronically Signed   By: Rolm Baptise M.D.   On: 05/29/2020 20:12  Today's Vitals: BP 116/78 (BP Location: Left Arm, Patient Position: Sitting, Cuff Size: Normal) Comment (BP Location): forearm  Pulse 84   Temp 98.3 F (36.8 C) (Oral)   Ht 5\' 4"  (1.626 m)   Wt 266 lb (120.7 kg)   LMP 06/25/2020 (Exact Date)   SpO2 98%   BMI 45.66 kg/m   Physical Exam   Physical Exam General: Well Developed, well nourished, and in no acute distress.  Neuro: Alert and oriented x3, extra-ocular muscles intact, sensation grossly intact. Cranial nerves II through XII are intact, motor, sensory, and coordinative functions are grossly intact. HEENT: Normocephalic, atraumatic, pupils equal round reactive to light, neck supple, no masses, no lymphadenopathy, thyroid nonpalpable. Oropharynx, nasopharynx, external ear canals are unremarkable. Bilateral tympanic membranes benign +cerumen surrounding. Skin: Warm and dry, no rashes noted, +hirsutism.  Cardiac: Regular rate and rhythm, no murmurs, rubs, or gallops. Respiratory: Clear to auscultation bilaterally. Not using accessory muscles, speaking in full sentences.  Abdominal: Soft, nontender, nondistended, positive bowel sounds, no masses, no organomegaly.  Musculoskeletal: Shoulder, elbow, wrist, hip, knee, ankle stable, and with full range of motion.    Assessment & Plan:   Problem List Items  Addressed This Visit      Digestive   Gastroesophageal reflux disease without esophagitis    Patient with 1 year history of reflux, erosive component not noted, extensive discussion over dietary and lifestyle modifications to place, plan for Rx management with omeprazole 20 mg daily with plan to reassess symptomatic response in 1 month.  If still symptomatic can consider escalation of pharmacotherapy.  Patient is understanding and amenable to this plan.      Relevant Medications   omeprazole (PRILOSEC) 20 MG capsule   Other Relevant Orders   Lipase (Completed)     Other   Anxiety and depression    Patient with GAD-7 in normal range, PHQ positive.  I reviewed various treatment strategies to include possible cognitive behavioral therapy and/or pharmacotherapy.  At this stage she is wishing to pursue cognitive behavioral therapy and we have placed a referral accordingly.  We can follow-up on this issue on an as-needed basis.      Relevant Orders   Ambulatory referral to Psychiatry   Fibroid    Patient with CT evidence of fibroid, she is also symptomatic, well woman check pending.  Referral to OB/GYN has been placed who will follow up for these issues.      Relevant Orders   Ambulatory referral to Gynecology   Class 3 severe obesity due to excess calories with body mass index (  BMI) of 45.0 to 49.9 in adult [facility])   Relevant Orders   Comprehensive metabolic panel (Completed)   TSH (Completed)   Lipid panel (Completed)   Hemoglobin A1c (Completed)      Outpatient Encounter Medications as of 07/13/2020  Medication Sig  . ferrous sulfate 325 (65 FE) MG tablet Take 325 mg by mouth daily with breakfast.  . Iron Sucrose (VENOFER IV) Inject 200 mg into the vein every 7 (seven) days.  Marland Kitchen omeprazole (PRILOSEC) 20 MG capsule Take 1 capsule (20 mg total) by mouth daily.   No facility-administered encounter medications on file as of 07/13/2020.    Follow-up: Return in about 1 month (around  08/13/2020).   Montel Culver, MD

## 2020-07-14 NOTE — Assessment & Plan Note (Signed)
Patient with 1 year history of reflux, erosive component not noted, extensive discussion over dietary and lifestyle modifications to place, plan for Rx management with omeprazole 20 mg daily with plan to reassess symptomatic response in 1 month.  If still symptomatic can consider escalation of pharmacotherapy.  Patient is understanding and amenable to this plan.

## 2020-07-14 NOTE — Telephone Encounter (Signed)
-----   Message from Montel Culver, MD sent at 07/14/2020  8:45 AM EDT ----- Eileen Vargas, the labs that we ordered look good! The glucose was slightly elevated but that is expected if it was not done fasting. That being said, the hemoglobin A1C number (which shows the average blood sugar over the past 3 months) is totally normal, as is the thyroid.   Your triglycerides (one of the lipids checked is high at 178 (limit is 149) but the other numbers for cholesterol look good. Try avoiding sugars, refined carbohydrates, and alcohol if applicable, and add more omega-3 fatty acids to the diet.  I will see you in a month so we can discuss this further, contact for any questions between now and then.

## 2020-07-14 NOTE — Assessment & Plan Note (Signed)
Patient with CT evidence of fibroid, she is also symptomatic, well woman check pending.  Referral to OB/GYN has been placed who will follow up for these issues.

## 2020-07-15 ENCOUNTER — Encounter: Payer: Self-pay | Admitting: Family Medicine

## 2020-07-15 NOTE — Telephone Encounter (Signed)
Please review.  KP

## 2020-07-20 ENCOUNTER — Inpatient Hospital Stay: Payer: 59

## 2020-07-20 ENCOUNTER — Other Ambulatory Visit: Payer: Self-pay

## 2020-07-20 VITALS — BP 134/88 | HR 72 | Temp 99.0°F | Resp 18

## 2020-07-20 DIAGNOSIS — D508 Other iron deficiency anemias: Secondary | ICD-10-CM

## 2020-07-20 DIAGNOSIS — D509 Iron deficiency anemia, unspecified: Secondary | ICD-10-CM | POA: Diagnosis not present

## 2020-07-20 MED ORDER — SODIUM CHLORIDE 0.9 % IV SOLN: 200.0000 mg | Freq: Once | INTRAVENOUS | Status: DC

## 2020-07-20 MED ORDER — IRON SUCROSE 20 MG/ML IV SOLN
200.0000 mg | Freq: Once | INTRAVENOUS | Status: AC
  Administered 2020-07-20: 200 mg via INTRAVENOUS
  Filled 2020-07-20: qty 10

## 2020-07-20 MED ORDER — SODIUM CHLORIDE 0.9 % IV SOLN
Freq: Once | INTRAVENOUS | Status: AC
  Filled 2020-07-20: qty 250

## 2020-07-23 ENCOUNTER — Ambulatory Visit (INDEPENDENT_AMBULATORY_CARE_PROVIDER_SITE_OTHER): Payer: 59 | Admitting: Obstetrics and Gynecology

## 2020-07-23 ENCOUNTER — Other Ambulatory Visit (HOSPITAL_COMMUNITY)
Admission: RE | Admit: 2020-07-23 | Discharge: 2020-07-23 | Disposition: A | Discharge status: Home / Self Care | Payer: 59 | Source: Ambulatory Visit | Attending: Obstetrics and Gynecology | Admitting: Obstetrics and Gynecology

## 2020-07-23 ENCOUNTER — Encounter: Payer: Self-pay | Admitting: Obstetrics and Gynecology

## 2020-07-23 ENCOUNTER — Other Ambulatory Visit: Payer: Self-pay

## 2020-07-23 VITALS — BP 127/84 | HR 101 | Ht 64.0 in | Wt 249.0 lb

## 2020-07-23 DIAGNOSIS — N939 Abnormal uterine and vaginal bleeding, unspecified: Secondary | ICD-10-CM

## 2020-07-23 DIAGNOSIS — Z124 Encounter for screening for malignant neoplasm of cervix: Secondary | ICD-10-CM

## 2020-07-23 NOTE — Patient Instructions (Addendum)
Options 1) Do nothing 2) Birth control (pill, depo provera shot, or Mirena IUD) 3) Fibroid specific hormonal medicines (Oriahnn or Myfembree) 4) Depo Lupron injection for 6 months (causes menopause for 6 months shrinking fibroids) 5) Myomectomy - surgery to remove fibroid leave uterus (abdominal incision, needs to wait 1 year for pregnancy, pregnancies will need to be delivered via C-section following the procedure)  Options not compatible with future fertility 5) Uterine artery embolization 6) Hysterectom   Uterine Fibroids  Uterine fibroids, also called leiomyomas, are noncancerous (benign) tumors that can grow in the uterus. They can cause heavy menstrual bleeding and pain. Fibroids may also grow in the fallopian tubes, cervix, or tissues (ligaments) near the uterus. You may have one or many fibroids. Fibroids vary in size, weight, and where they grow in the uterus. Some can become quite large. Most fibroids do not require medical treatment. What are the causes? The cause of this condition is not known. What increases the risk? You are more likely to develop this condition if you:  Are in your 30s or 40s and have not gone through menopause.  Have a family history of this condition.  Are of African American descent.  Started your menstrual period at age 58 or younger.  Have never given birth.  Are overweight or obese. What are the signs or symptoms? Many women do not have any symptoms. Symptoms of this condition may include:  Heavy menstrual bleeding.  Bleeding between menstrual periods.  Pain and pressure in the pelvic area, between your hip bones.  Pain during sex.  Bladder problems, such as needing to urinate right away or more often than usual.  Inability to have children (infertility).  Failure to carry pregnancy to term (miscarriage). How is this diagnosed? This condition may be diagnosed based on:  Your symptoms and medical history.  A physical exam.  A  pelvic exam that includes feeling for any tumors.  Imaging tests, such as ultrasound or MRI. How is this treated? Treatment for this condition may include follow-up visits with your health care provider to monitor your fibroids for any changes. Other treatment may include:  Medicines, such as: ? Medicines to relieve pain, including aspirin and NSAIDs, such as ibuprofen or naproxen. ? Hormone therapy. Treatment may be given as a pill or an injection, or it may be inserted into the uterus using an intrauterine device (IUD).  Surgery that would do one of the following: ? Remove the fibroids (myomectomy). This may be recommended if fibroids affect your fertility and you want to become pregnant. ? Remove the uterus (hysterectomy). ? Block the blood supply to the fibroids (uterine artery embolization). This can cause them to shrink and die. Follow these instructions at home: Medicines  Take over-the-counter and prescription medicines only as told by your health care provider.  Ask your health care provider if you should take iron pills or eat more iron-rich foods, such as dark green, leafy vegetables. Heavy menstrual bleeding can cause low iron levels. Managing pain If directed, apply heat to your back or abdomen to reduce pain. Use the heat source that your health care provider recommends, such as a moist heat pack or a heating pad. To apply heat:  Place a towel between your skin and the heat source.  Leave the heat on for 20-30 minutes.  Remove the heat if your skin turns bright red. This is especially important if you are unable to feel pain, heat, or cold. You may have a greater risk  of getting burned.   General instructions  Pay close attention to your menstrual cycle. Tell your health care provider about any changes, such as: ? Heavier bleeding that requires you to change your pads or tampons more than usual. ? A change in the number of days that your menstrual period lasts. ? A  change in symptoms that come with your menstrual period, such as back pain or cramps in your abdomen.  Keep all follow-up visits. This is important, especially if your fibroids need to be monitored for any changes. Contact a health care provider if you:  Have pelvic pain, back pain, or cramps in your abdomen that do not get better with medicine or heat.  Develop new bleeding between menstrual periods.  Have increased bleeding during or between menstrual periods.  Feel more tired or weak than usual.  Feel light-headed. Get help right away if you:  Faint.  Have pelvic pain that suddenly gets worse.  Have severe vaginal bleeding that soaks a tampon or pad in 30 minutes or less. Summary  Uterine fibroids are noncancerous (benign) tumors that can develop in the uterus.  The exact cause of this condition is not known.  Most fibroids do not require medical treatment unless they affect your ability to have children (fertility).  Contact a health care provider if you have pelvic pain, back pain, or cramps in your abdomen that do not get better with medicines.  Get help right away if you faint, have pelvic pain that suddenly gets worse, or have severe vaginal bleeding. This information is not intended to replace advice given to you by your health care provider. Make sure you discuss any questions you have with your health care provider. Document Revised: 11/12/2019 Document Reviewed: 11/12/2019 Elsevier Patient Education  [place].

## 2020-07-23 NOTE — Progress Notes (Signed)
Gynecology Abnormal Uterine Bleeding Initial Evaluation   Chief Complaint:  Chief Complaint  Patient presents with  . Gynecologic Exam    [facility] referral for fibroids. RM 3    History of Present Illness:    Paitient is a 30 y.o. No obstetric history on file. who LMP was Patient's last menstrual period was 07/05/2020., presents today for a problem visit.  She complains of menorrhagia that  began several months ago and its severity is described as moderate.  The patient menstrual complaints are chronic present for the past 6 months.  Menses are regular but flow is heavier.  She does have some associated dysmenorrhea.  Is anemic based on prior labs which we discussed is likely compounded by her heavy menstrual bleeding.     Previous evaluation: CT A/P Prior Diagnosis: Uterine fibroids Previous Treatment: none.  LMP: Patient's last menstrual period was 07/05/2020.  Paramter Normal / Abnormal Prsent  Frequency Am[place]hea     Infrequent (>38 days)     Normal (?24 days ?38 days) X   Freequent (<24 days)    Duration Normal (?8 days) X   Prolonged (>8 days)    Regularity Regular (shortest to longest cycle variation ?7-9 days)* X   Irregular (shortest to longest cycle variation ?8-10days)*    Flow Volume Light    (Self reported) Normal     Heavy X      Intermenstrual Bleeding None X   Random     Cyclical early     Cyclical mid     Cyclical late        Unscheduled Bleeding  Not applicable X  (exogenous hormones) Absent     Present     FIGO AUB I System: *The available evidence suggests that, using these criteria, the normal range (shortest to longest) varies with age: 51-25 y of age, ?91 d; 36-41 y, ?7 d; and for 34-45 y, ?9 d    Review of Systems: Review of Systems  Constitutional: Negative.   Gastrointestinal: Negative.   Genitourinary: Negative.   Endo/Heme/Allergies: Does not bruise/bleed easily.    Past Medical History:  Patient Active Problem List    Diagnosis Date Noted  . Anxiety and depression 07/13/2020  . Gastroesophageal reflux disease without esophagitis 07/13/2020  . Fibroid 07/13/2020  . Class 3 severe obesity due to excess calories with body mass index (BMI) of 45.0 to 49.9 in adult [facility]) 07/13/2020  . Sickle cell trait ([place]) 07/01/2020  . Iron deficiency anemia 06/06/2020    Past Surgical History:  Past Surgical History:  Procedure Laterality Date  . CHOLECYSTECTOMY  2013    Obstetric History: No obstetric history on file.  Family History:  Family History  Problem Relation Age of Onset  . Diabetes Maternal Grandmother   . Lung cancer Maternal Grandfather   . Kidney disease Mother   . Clotting disorder Father   . Sickle cell trait Father     Social History:  Social History   Socioeconomic History  . Marital status: Significant Other    Spouse name: Margart Sickles  . Number of children: 0  . Years of education: 1  . Highest education level: Associate degree: occupational, Hotel manager, or vocational program  Occupational History  . Not on file  Tobacco Use  . Smoking status: Never Smoker  . Smokeless tobacco: Never Used  Vaping Use  . Vaping Use: Never used  Substance and Sexual Activity  . Alcohol use: Yes    Alcohol/week: 2.0  standard drinks    Types: 1 Glasses of wine, 1 Shots of liquor per week  . Drug use: Never  . Sexual activity: Yes    Birth control/protection: Condom  Other Topics Concern  . Not on file  Social History Narrative  . Not on file   Social Determinants of Health   Financial Resource Strain: Not on file  Food Insecurity: Not on file  Transportation Needs: Not on file  Physical Activity: Not on file  Stress: Not on file  Social Connections: Not on file  Intimate Partner Violence: Not on file    Allergies:  No Known Allergies  Medications: Prior to Admission medications   Medication Sig Start Date End Date Taking? Authorizing Provider  ferrous sulfate 325 (65 FE) MG  tablet Take 325 mg by mouth daily with breakfast.   Yes [provider]  omeprazole (PRILOSEC) 20 MG capsule Take 1 capsule (20 mg total) by mouth daily. 07/13/20  Yes Montel Culver, MD  Iron Sucrose (VENOFER IV) Inject 200 mg into the vein every 7 (seven) days.    [provider]    Physical Exam Blood pressure 127/84, pulse (!) 101, height 5\' 4"  (1.626 m), weight 249 lb (112.9 kg), last menstrual period 07/05/2020. Body mass index is 42.74 kg/m.  Patient's last menstrual period was 07/05/2020.  General: NAD, obese, appears stated age 35: normocephalic, anicteric Pulmonary: No increased work of breathing Abdomen: soft, non-tender, non-distended.  Genitourinary:  External: Normal external female genitalia.  Normal urethral meatus, normal Bartholin's and Skene's glands.    Vagina: Normal vaginal mucosa, no evidence of prolapse.    Cervix: Grossly normal in appearance, no bleeding  Uterus: Not significantly enlarged but exam limited by habitus, mobile, normal contour.  No CMT  Adnexa: ovaries non-enlarged, no adnexal masses  Rectal: deferred  Lymphatic: no evidence of inguinal lymphadenopathy Extremities: no edema, erythema, or tenderness Neurologic: Grossly intact Psychiatric: mood appropriate, affect full  Female chaperone present for pelvic portions of the physical exam  Assessment: 30 y.o. . with abnormal uterine bleeding  Plan: Problem List Items Addressed This Visit   None   Visit Diagnoses    Abnormal uterine bleeding    -  Primary   Screening for malignant neoplasm of cervix       Relevant Orders   Cytology - PAP (Completed)      1) Uterine firbroid.  Patient undecided on next management steps.  Will allow to consider and then asked to contact us.  In particular we stressed option compatible with future fertility. - pap  brought up to date  Options 1) Do nothing 2) Birth control (pill, depo provera shot, or Mirena IUD) 3) Fibroid specific  hormonal medicines (Oriahnn or Myfembree) 4) Depo Lupron injection for 6 months (causes menopause for 6 months shrinking fibroids) 5) Myomectomy - surgery to remove fibroid leave uterus (abdominal incision, needs to wait 1 year for pregnancy, pregnancies will need to be delivered via C-section following the procedure)  Options not compatible with future fertility 5) Uterine artery embolization 6) Hysterectom  2) A total of 23 minutes were spent in face-to-face contact with the patient during this encounter with over half of that time devoted to counseling and coordination of care.  3)  Return if symptoms worsen or fail to improve.   Malachy Mood, MD, [facility], [facility] 07/23/2020, 2:37 PM

## 2020-07-27 ENCOUNTER — Inpatient Hospital Stay: Payer: 59 | Attending: Oncology

## 2020-07-27 VITALS — BP 143/77 | HR 78 | Temp 97.2°F

## 2020-07-27 DIAGNOSIS — Z79899 Other long term (current) drug therapy: Secondary | ICD-10-CM | POA: Insufficient documentation

## 2020-07-27 DIAGNOSIS — D509 Iron deficiency anemia, unspecified: Secondary | ICD-10-CM | POA: Diagnosis present

## 2020-07-27 DIAGNOSIS — D508 Other iron deficiency anemias: Secondary | ICD-10-CM

## 2020-07-27 LAB — CYTOLOGY - PAP
Comment: NEGATIVE
Diagnosis: NEGATIVE
High risk HPV: NEGATIVE

## 2020-07-27 MED ORDER — SODIUM CHLORIDE 0.9 % IV SOLN
Freq: Once | INTRAVENOUS | Status: AC
  Filled 2020-07-27: qty 250

## 2020-07-27 MED ORDER — SODIUM CHLORIDE 0.9 % IV SOLN: 200.0000 mg | Freq: Once | INTRAVENOUS | Status: DC

## 2020-07-27 MED ORDER — IRON SUCROSE 20 MG/ML IV SOLN
200.0000 mg | Freq: Once | INTRAVENOUS | Status: AC
  Administered 2020-07-27: 200 mg via INTRAVENOUS
  Filled 2020-07-27: qty 10

## 2020-08-14 ENCOUNTER — Other Ambulatory Visit: Payer: Self-pay

## 2020-08-14 ENCOUNTER — Encounter: Payer: Self-pay | Admitting: Family Medicine

## 2020-08-14 ENCOUNTER — Ambulatory Visit: Payer: 59 | Admitting: Family Medicine

## 2020-08-14 VITALS — BP 122/84 | HR 67 | Temp 98.4°F | Ht 64.0 in | Wt 248.4 lb

## 2020-08-14 DIAGNOSIS — Z114 Encounter for screening for human immunodeficiency virus [HIV]: Secondary | ICD-10-CM

## 2020-08-14 DIAGNOSIS — K219 Gastro-esophageal reflux disease without esophagitis: Secondary | ICD-10-CM

## 2020-08-14 DIAGNOSIS — Z6841 Body Mass Index (BMI) 40.0 and over, adult: Secondary | ICD-10-CM

## 2020-08-14 DIAGNOSIS — F419 Anxiety disorder, unspecified: Secondary | ICD-10-CM

## 2020-08-14 DIAGNOSIS — Z1159 Encounter for screening for other viral diseases: Secondary | ICD-10-CM

## 2020-08-14 DIAGNOSIS — F32A Depression, unspecified: Secondary | ICD-10-CM

## 2020-08-14 MED ORDER — OMEPRAZOLE 20 MG PO CPDR: 20.0000 mg | DELAYED_RELEASE_CAPSULE | Freq: Every day | ORAL | 1 refills | Status: DC | PRN

## 2020-08-14 NOTE — Assessment & Plan Note (Signed)
Patient states that symptoms are dramatically improved and have essentially resolved following 2-3 days of administration of PPI.  That being said, she is also incorporated dietary/lifestyle modifications.  Given her excellent response I have advised a transition to as needed dosing, continued dietary/left L modifications, and have refilled her medication.  We can follow-up on this issue on an as-needed basis.

## 2020-08-14 NOTE — Assessment & Plan Note (Signed)
Patient has reassuring vitals beyond BMI, recent labs show elevated triglycerides.  I have reviewed the same with the patient as well as specific dietary and activity modifications.  She has already made similar plans with her partner inclusive of increased vegetables, decreased eating out, increased activity on a regular basis.  I have reinforced and commended patient on these interventions.  Plan for recheck of lipid profile and A1c in 6 months, formal visit at that time.

## 2020-08-14 NOTE — Progress Notes (Signed)
Primary Care / Sports Medicine Office Visit  Patient Information:  Patient ID: Eileen Vargas, female DOB: Jun 09, 1990 Age: 30 y.o. MRN: [id number]   Eileen Vargas is a pleasant 30 y.o. female presenting with the following:  Chief Complaint  Patient presents with  . Follow-up  . Gastroesophageal Reflux    Started on omeprazole 20 mg daily a month ago; patient tolerating well    Review of Systems pertinent details above   Patient Active Problem List   Diagnosis Date Noted  . Anxiety and depression 07/13/2020  . Gastroesophageal reflux disease without esophagitis 07/13/2020  . Fibroid 07/13/2020  . Class 3 severe obesity due to excess calories with body mass index (BMI) of 45.0 to 49.9 in adult [facility]) 07/13/2020  . Sickle cell trait ([place]) 07/01/2020  . Iron deficiency anemia 06/06/2020   Past Medical History:  Diagnosis Date  . Anemia    Outpatient Medications Prior to Visit  Medication Sig Dispense Refill  . ferrous sulfate 325 (65 FE) MG tablet Take 325 mg by mouth daily with breakfast.    . Iron Sucrose (VENOFER IV) Inject 200 mg into the vein every 7 (seven) days.    Marland Kitchen omeprazole (PRILOSEC) 20 MG capsule Take 1 capsule (20 mg total) by mouth daily. 30 capsule 1   No facility-administered medications prior to visit.    Past Surgical History:  Procedure Laterality Date  . CHOLECYSTECTOMY  2013   Social History   Socioeconomic History  . Marital status: Significant Other    Spouse name: Margart Sickles  . Number of children: 0  . Years of education: 30  . Highest education level: Associate degree: occupational, Hotel manager, or vocational program  Occupational History  . Not on file  Tobacco Use  . Smoking status: Never Smoker  . Smokeless tobacco: Never Used  Vaping Use  . Vaping Use: Never used  Substance and Sexual Activity  . Alcohol use: Yes    Alcohol/week: 2.0 standard drinks    Types: 1 Glasses of wine, 1 Shots of liquor per week  . Drug use: Never  .  Sexual activity: Yes    Birth control/protection: Condom  Other Topics Concern  . Not on file  Social History Narrative  . Not on file   Social Determinants of Health   Financial Resource Strain: Not on file  Food Insecurity: Not on file  Transportation Needs: Not on file  Physical Activity: Not on file  Stress: Not on file  Social Connections: Not on file  Intimate Partner Violence: Not on file   Family History  Problem Relation Age of Onset  . Diabetes Maternal Grandmother   . Lung cancer Maternal Grandfather   . Kidney disease Mother   . Clotting disorder Father   . Sickle cell trait Father    No Known Allergies  Vitals:   08/14/20 0929  BP: 122/84  Pulse: 67  Temp: 98.4 F (36.9 C)  SpO2: 98%   Vitals:   08/14/20 0929  Weight: 248 lb 6.4 oz (112.7 kg)  Height: 5\' 4"  (1.626 m)   Body mass index is 42.64 kg/m.  No results found.   Independent interpretation of notes and tests performed by another provider:   None  Procedures performed:   None  Pertinent History, Exam, Impression, and Recommendations:   Gastroesophageal reflux disease without esophagitis Patient states that symptoms are dramatically improved and have essentially resolved following 2-3 days of administration of PPI.  That being said, she is also incorporated  dietary/lifestyle modifications.  Given her excellent response I have advised a transition to as needed dosing, continued dietary/left L modifications, and have refilled her medication.  We can follow-up on this issue on an as-needed basis.  Class 3 severe obesity due to excess calories with body mass index (BMI) of 45.0 to 49.9 in adult [facility]) Patient has reassuring vitals beyond BMI, recent labs show elevated triglycerides.  I have reviewed the same with the patient as well as specific dietary and activity modifications.  She has already made similar plans with her partner inclusive of increased vegetables, decreased eating out,  increased activity on a regular basis.  I have reinforced and commended patient on these interventions.  Plan for recheck of lipid profile and A1c in 6 months, formal visit at that time.  Anxiety and depression Symptoms are stable, patient has not heard back from psychiatry following our referral at previous visit.  We have provided the number to the patient after contacting the the group today.  At their recommendation, patient is to contact them to expedite scheduling.  We can track this issue on an as-needed basis.    Orders & Medications Meds ordered this encounter  Medications  . omeprazole (PRILOSEC) 20 MG capsule    Sig: Take 1 capsule (20 mg total) by mouth daily as needed.    Dispense:  90 capsule    Refill:  1   Orders Placed This Encounter  Procedures  . Hepatitis C Antibody  . HIV antibody (with reflex)     Return in about 6 months (around 02/13/2021) for follow-up labs.     Montel Culver, MD   Primary Care Sports Medicine [place]

## 2020-08-14 NOTE — Addendum Note (Signed)
Addended by: Forbes Cellar on: 08/14/2020 10:38 AM   Modules accepted: Orders

## 2020-08-14 NOTE — Addendum Note (Signed)
Addended by: Montel Culver on: 08/14/2020 11:23 AM   Modules accepted: Level of Service

## 2020-08-14 NOTE — Assessment & Plan Note (Signed)
Symptoms are stable, patient has not heard back from psychiatry following our referral at previous visit.  We have provided the number to the patient after contacting the the group today.  At their recommendation, patient is to contact them to expedite scheduling.  We can track this issue on an as-needed basis.

## 2020-08-14 NOTE — Patient Instructions (Signed)
-   Obtain lab work - Diet and exercise changes as you are already doing and below:  Diet & Exercise Recommendations Dietary changes to include reducing saturated fats, sodium, avoiding red meat, fried, processed foods, full-fat dairy, baked goods, and sweets. Incorporate more fruits, vegetables, whole grains, and transition to more eggs and lean meats. Make realistic changes where you can and stick to it!  Physical activity should be focused on getting 150 to 300 minutes per week of moderate to vigorous activity (30 minutes of activity at least 5 days of the week at a level of intensity where you can carry on a conversation without being out of breath). Any increase in activity is beneficial to your health! . Evidence for the benefits of physical activity for weight gain, adiposity, and bone health exists for children as young as three years. . Increasing physical activity in older adults can help them maintain independence by reducing cognitive decline and falls. . Physical activity reduces symptoms of depression and anxiety and improves sleep quality.

## 2020-08-15 LAB — HIV ANTIBODY (ROUTINE TESTING W REFLEX): HIV Screen 4th Generation wRfx: NONREACTIVE

## 2020-08-15 LAB — HEPATITIS C ANTIBODY: Hep C Virus Ab: <0.1 s/co ratio (ref 0.0–0.9)

## 2020-08-17 ENCOUNTER — Ambulatory Visit: Payer: 59 | Admitting: Family Medicine

## 2020-09-10 ENCOUNTER — Other Ambulatory Visit: Payer: Self-pay | Admitting: Family Medicine

## 2020-09-10 DIAGNOSIS — K219 Gastro-esophageal reflux disease without esophagitis: Secondary | ICD-10-CM

## 2020-09-10 MED ORDER — ESOMEPRAZOLE MAGNESIUM 40 MG PO CPDR: 40.0000 mg | DELAYED_RELEASE_CAPSULE | Freq: Every day | ORAL | 3 refills | Status: DC | PRN

## 2020-09-10 NOTE — Telephone Encounter (Signed)
[facility] and spoke with [facility]. [facility] states that the prescription for Omeprazole sent to the pharmacy on 08/14/20 was received but will need prior authorization. Insurance will only cover 90 capsules in a one year period. Patient could be switched to Nexium or Pantoprazole as an alternative.

## 2020-09-10 NOTE — Telephone Encounter (Signed)
Please advise for pantoprazole or Nexium as an alternative due to omeprazole being non-formulary.

## 2020-09-14 ENCOUNTER — Encounter: Payer: Self-pay | Admitting: Oncology

## 2020-10-28 ENCOUNTER — Other Ambulatory Visit: Payer: Self-pay | Admitting: *Deleted

## 2020-10-28 ENCOUNTER — Encounter: Payer: Self-pay | Admitting: Oncology

## 2020-10-28 DIAGNOSIS — D508 Other iron deficiency anemias: Secondary | ICD-10-CM

## 2020-10-30 ENCOUNTER — Inpatient Hospital Stay: Payer: Self-pay | Attending: Nurse Practitioner

## 2020-10-30 DIAGNOSIS — Z79899 Other long term (current) drug therapy: Secondary | ICD-10-CM | POA: Insufficient documentation

## 2020-10-30 DIAGNOSIS — D508 Other iron deficiency anemias: Secondary | ICD-10-CM

## 2020-10-30 DIAGNOSIS — Z801 Family history of malignant neoplasm of trachea, bronchus and lung: Secondary | ICD-10-CM | POA: Insufficient documentation

## 2020-10-30 DIAGNOSIS — D509 Iron deficiency anemia, unspecified: Secondary | ICD-10-CM | POA: Insufficient documentation

## 2020-10-30 DIAGNOSIS — D573 Sickle-cell trait: Secondary | ICD-10-CM | POA: Insufficient documentation

## 2020-10-30 LAB — CBC WITH DIFFERENTIAL/PLATELET
Abs Immature Granulocytes: 0.01 10*3/uL (ref 0.00–0.07)
Basophils Absolute: 0 10*3/uL (ref 0.0–0.1)
Basophils Relative: 1 %
Eosinophils Absolute: 0 10*3/uL (ref 0.0–0.5)
Eosinophils Relative: 1 %
HCT: 35.1 % — ABNORMAL LOW (ref 36.0–46.0)
Hemoglobin: 11.8 g/dL — ABNORMAL LOW (ref 12.0–15.0)
Immature Granulocytes: 0 %
Lymphocytes Relative: 40 %
Lymphs Abs: 2.3 10*3/uL (ref 0.7–4.0)
MCH: 28.9 pg (ref 26.0–34.0)
MCHC: 33.6 g/dL (ref 30.0–36.0)
MCV: 85.8 fL (ref 80.0–100.0)
Monocytes Absolute: 0.4 10*3/uL (ref 0.1–1.0)
Monocytes Relative: 7 %
Neutro Abs: 2.9 10*3/uL (ref 1.7–7.7)
Neutrophils Relative %: 51 %
Platelets: 263 10*3/uL (ref 150–400)
RBC: 4.09 MIL/uL (ref 3.87–5.11)
RDW: 13.2 % (ref 11.5–15.5)
WBC: 5.6 10*3/uL (ref 4.0–10.5)
nRBC: 0 % (ref 0.0–0.2)

## 2020-10-30 LAB — FERRITIN: Ferritin: 42 ng/mL (ref 11–307)

## 2020-10-30 LAB — IRON AND TIBC
Iron: 54 ug/dL (ref 28–170)
Saturation Ratios: 13 % (ref 10.4–31.8)
TIBC: 402 ug/dL (ref 250–450)
UIBC: 348 ug/dL

## 2020-11-03 ENCOUNTER — Ambulatory Visit: Payer: 59

## 2020-11-03 ENCOUNTER — Ambulatory Visit: Payer: 59 | Admitting: Nurse Practitioner

## 2020-11-05 ENCOUNTER — Encounter: Payer: Self-pay | Admitting: Nurse Practitioner

## 2020-11-05 ENCOUNTER — Inpatient Hospital Stay: Payer: Self-pay

## 2020-11-05 ENCOUNTER — Inpatient Hospital Stay (HOSPITAL_BASED_OUTPATIENT_CLINIC_OR_DEPARTMENT_OTHER): Payer: Self-pay | Admitting: Nurse Practitioner

## 2020-11-05 VITALS — BP 137/92 | HR 74 | Temp 98.9°F | Resp 19 | Wt 251.0 lb

## 2020-11-05 DIAGNOSIS — D509 Iron deficiency anemia, unspecified: Secondary | ICD-10-CM

## 2020-11-05 DIAGNOSIS — D573 Sickle-cell trait: Secondary | ICD-10-CM

## 2020-11-05 NOTE — Progress Notes (Signed)
Anemia follow up. Possible IV iron today. Pt denies any weakness. Feeling well. Has heavy menses due to a fibroid.

## 2020-11-05 NOTE — Patient Instructions (Signed)
Happy Birthday!

## 2020-11-05 NOTE — Progress Notes (Signed)
[place]  [phone] [phone]) [phone]  ID: Eileen Vargas OB: 01-Jan-1991  MR#: [id number]  NIO#:[id number]  Patient Care Team: Montel Culver, MD as PCP - General (Family Medicine)  CHIEF COMPLAINT: Iron deficiency anemia.  INTERVAL HISTORY:   Patient returns to clinic today for further evaluation, discussion of her laboratory results, and initiation of IV Venofer.  She continues to feel well and remains asymptomatic. She does not complain of any weakness or fatigue.  She has a good appetite and denies weight loss.  She has no chest pain, shortness of breath, cough, or hemoptysis.  She denies any nausea, vomiting, constipation, or diarrhea.  She has no melena or hematochezia.  She has no urinary complaints.  Patient offers no specific complaints today.  REVIEW OF SYSTEMS:   Review of Systems  Constitutional: Negative.  Negative for fever, malaise/fatigue and weight loss.  Respiratory: Negative.  Negative for cough, hemoptysis and shortness of breath.   Cardiovascular: Negative.  Negative for chest pain and leg swelling.  Gastrointestinal: Negative.  Negative for abdominal pain and blood in stool.  Genitourinary: Negative.  Negative for hematuria.  Musculoskeletal: Negative.  Negative for back pain.  Skin: Negative.  Negative for rash.  Neurological: Negative.  Negative for dizziness, focal weakness, weakness and headaches.  Psychiatric/Behavioral: Negative.  The patient is not nervous/anxious.    As per HPI. Otherwise, a complete review of systems is negative.  PAST MEDICAL HISTORY: Past Medical History:  Diagnosis Date   Anemia     PAST SURGICAL HISTORY: Past Surgical History:  Procedure Laterality Date   CHOLECYSTECTOMY  2013    FAMILY HISTORY: Family History  Problem Relation Age of Onset   Diabetes Maternal Grandmother    Lung cancer Maternal Grandfather    Kidney disease Mother    Clotting disorder Father    Sickle cell trait  Father     ADVANCED DIRECTIVES (Y/N):  N  HEALTH MAINTENANCE: Social History   Tobacco Use   Smoking status: Never   Smokeless tobacco: Never  Vaping Use   Vaping Use: Never used  Substance Use Topics   Alcohol use: Yes    Alcohol/week: 2.0 standard drinks    Types: 1 Glasses of wine, 1 Shots of liquor per week   Drug use: Never     Colonoscopy:  PAP:  Bone density:  Lipid panel:  No Known Allergies  Current Outpatient Medications  Medication Sig Dispense Refill   esomeprazole (NEXIUM) 40 MG capsule Take 1 capsule (40 mg total) by mouth daily as needed. One tab by mouth at dinner time. 90 capsule 3   Iron Sucrose (VENOFER IV) Inject 200 mg into the vein every 7 (seven) days.     ferrous sulfate 325 (65 FE) MG tablet Take 325 mg by mouth daily with breakfast.     No current facility-administered medications for this visit.    OBJECTIVE: Vitals:   11/05/20 1358  BP: (!) 137/92  Pulse: 74  Resp: 19  Temp: 98.9 F (37.2 C)  SpO2: 100%     Body mass index is 43.08 kg/m.    ECOG FS:0 - Asymptomatic  General: Well-developed, well-nourished, no acute distress. Eyes: Pink conjunctiva, anicteric sclera. Lungs: Clear to auscultation bilaterally.  No audible wheezing or coughing Heart: Regular rate and rhythm.  Abdomen: Soft, nontender, nondistended.  Musculoskeletal: No edema, cyanosis, or clubbing. Neuro: Alert, answering all questions appropriately. Cranial nerves grossly intact. Skin: No rashes or petechiae noted. Psych: Normal affect.  LAB  RESULTS:  Lab Results  Component Value Date   NA 141 07/13/2020   K 4.2 07/13/2020   CL 103 07/13/2020   CO2 20 07/13/2020   GLUCOSE 116 (H) 07/13/2020   BUN 8 07/13/2020   CREATININE 0.77 07/13/2020   CALCIUM 9.5 07/13/2020   PROT 7.2 07/13/2020   ALBUMIN 4.3 07/13/2020   AST 13 07/13/2020   ALT 12 07/13/2020   ALKPHOS 87 07/13/2020   BILITOT <0.2 07/13/2020   GFRNONAA >60 05/29/2020    Lab Results   Component Value Date   WBC 5.6 10/30/2020   NEUTROABS 2.9 10/30/2020   HGB 11.8 (L) 10/30/2020   HCT 35.1 (L) 10/30/2020   MCV 85.8 10/30/2020   PLT 263 10/30/2020   Lab Results  Component Value Date   IRON 54 10/30/2020   TIBC 402 10/30/2020   IRONPCTSAT 13 10/30/2020   Lab Results  Component Value Date   FERRITIN 42 10/30/2020     STUDIES: No results found.  ASSESSMENT: Iron deficiency anemia.  PLAN:    1.  Iron deficiency anemia: Hemoglobin improved compared to previous.  Iron stores and saturation ratio improved as well.  Iron saturation of 13%.  Clinically patient's symptoms have improved.  Continues to have heavy menses which are likely etiology of her iron deficiency.  Hold GI work-up at this time.  Return to clinic in 3 months with repeat labs, further evaluation, and consideration of additional IV Venofer.  2.  Sickle cell trait: No intervention needed.  She does not have any children at this time.  No intervention is needed.  Patient may have a mild baseline anemia secondary to this.  Monitor  Patient expressed understanding and was in agreement with this plan. She also understands that She can call clinic at any time with any questions, concerns, or complaints.    Verlon Au, NP   11/05/2020 2:11 PM

## 2021-01-28 ENCOUNTER — Other Ambulatory Visit: Payer: Self-pay

## 2021-01-28 DIAGNOSIS — D509 Iron deficiency anemia, unspecified: Secondary | ICD-10-CM

## 2021-02-01 ENCOUNTER — Other Ambulatory Visit: Payer: Self-pay

## 2021-02-01 ENCOUNTER — Inpatient Hospital Stay: Payer: Self-pay | Attending: Oncology

## 2021-02-01 DIAGNOSIS — Z79899 Other long term (current) drug therapy: Secondary | ICD-10-CM | POA: Insufficient documentation

## 2021-02-01 DIAGNOSIS — Z801 Family history of malignant neoplasm of trachea, bronchus and lung: Secondary | ICD-10-CM | POA: Insufficient documentation

## 2021-02-01 DIAGNOSIS — D509 Iron deficiency anemia, unspecified: Secondary | ICD-10-CM | POA: Insufficient documentation

## 2021-02-01 DIAGNOSIS — D573 Sickle-cell trait: Secondary | ICD-10-CM | POA: Insufficient documentation

## 2021-02-01 LAB — CBC WITH DIFFERENTIAL/PLATELET
Abs Immature Granulocytes: 0.01 10*3/uL (ref 0.00–0.07)
Basophils Absolute: 0 10*3/uL (ref 0.0–0.1)
Basophils Relative: 1 %
Eosinophils Absolute: 0.1 10*3/uL (ref 0.0–0.5)
Eosinophils Relative: 1 %
HCT: 34.4 % — ABNORMAL LOW (ref 36.0–46.0)
Hemoglobin: 11.4 g/dL — ABNORMAL LOW (ref 12.0–15.0)
Immature Granulocytes: 0 %
Lymphocytes Relative: 41 %
Lymphs Abs: 2.4 10*3/uL (ref 0.7–4.0)
MCH: 27.1 pg (ref 26.0–34.0)
MCHC: 33.1 g/dL (ref 30.0–36.0)
MCV: 81.7 fL (ref 80.0–100.0)
Monocytes Absolute: 0.4 10*3/uL (ref 0.1–1.0)
Monocytes Relative: 7 %
Neutro Abs: 2.9 10*3/uL (ref 1.7–7.7)
Neutrophils Relative %: 50 %
Platelets: 305 10*3/uL (ref 150–400)
RBC: 4.21 MIL/uL (ref 3.87–5.11)
RDW: 13.6 % (ref 11.5–15.5)
WBC: 5.8 10*3/uL (ref 4.0–10.5)
nRBC: 0 % (ref 0.0–0.2)

## 2021-02-01 LAB — IRON AND TIBC
Iron: 50 ug/dL (ref 28–170)
Saturation Ratios: 13 % (ref 10.4–31.8)
TIBC: 377 ug/dL (ref 250–450)
UIBC: 327 ug/dL

## 2021-02-01 LAB — FERRITIN: Ferritin: 17 ng/mL (ref 11–307)

## 2021-02-05 ENCOUNTER — Ambulatory Visit: Payer: 59 | Admitting: Family Medicine

## 2021-02-08 ENCOUNTER — Encounter: Payer: Self-pay | Admitting: Family Medicine

## 2021-02-08 ENCOUNTER — Other Ambulatory Visit: Payer: Self-pay

## 2021-02-08 ENCOUNTER — Ambulatory Visit (INDEPENDENT_AMBULATORY_CARE_PROVIDER_SITE_OTHER): Payer: Self-pay | Admitting: Family Medicine

## 2021-02-08 VITALS — BP 124/88 | HR 64 | Ht 64.0 in | Wt 251.0 lb

## 2021-02-08 DIAGNOSIS — E785 Hyperlipidemia, unspecified: Secondary | ICD-10-CM | POA: Insufficient documentation

## 2021-02-08 DIAGNOSIS — E781 Pure hyperglyceridemia: Secondary | ICD-10-CM

## 2021-02-08 DIAGNOSIS — R7301 Impaired fasting glucose: Secondary | ICD-10-CM

## 2021-02-08 DIAGNOSIS — D508 Other iron deficiency anemias: Secondary | ICD-10-CM

## 2021-02-08 DIAGNOSIS — F32A Anxiety disorder, unspecified: Secondary | ICD-10-CM

## 2021-02-08 DIAGNOSIS — F419 Anxiety disorder, unspecified: Secondary | ICD-10-CM

## 2021-02-08 DIAGNOSIS — K219 Gastro-esophageal reflux disease without esophagitis: Secondary | ICD-10-CM

## 2021-02-08 DIAGNOSIS — Z6841 Body Mass Index (BMI) 40.0 and over, adult: Secondary | ICD-10-CM

## 2021-02-08 MED ORDER — CITALOPRAM HYDROBROMIDE 20 MG PO TABS: 20.0000 mg | ORAL_TABLET | Freq: Every day | ORAL | 2 refills | Status: DC

## 2021-02-08 MED ORDER — ESOMEPRAZOLE MAGNESIUM 40 MG PO CPDR: 40.0000 mg | DELAYED_RELEASE_CAPSULE | Freq: Every day | ORAL | 3 refills | Status: DC | PRN

## 2021-02-08 NOTE — Assessment & Plan Note (Signed)
Healthy lifestyle measures discussed, we will hold from repeat labs until patient has a chance to talk to patient assistance liaison.

## 2021-02-08 NOTE — Assessment & Plan Note (Signed)
See additional assessment(s) for plan details. 

## 2021-02-08 NOTE — Progress Notes (Signed)
[place]  Telephone:([phone] Fax:(336) [phone]  ID: Eileen Vargas OB: [birth year]  MR#: [id number]  WLS#:[id number]  Patient Care Team: Montel Culver, MD as PCP - General (Family Medicine)  CHIEF COMPLAINT: Iron deficiency anemia.  INTERVAL HISTORY: Patient returns to clinic today for repeat laboratory work, further evaluation, and consideration of additional IV Venofer. Patient reports that she has increased iron in her diet.  She has no neurologic complaints.  She denies any recent fevers or illnesses.  She has a good appetite and denies weight loss.  She denies any weakness or fatigue. She has no chest pain, shortness of breath, cough, or hemoptysis.  She denies any nausea, vomiting, constipation, or diarrhea.  She has no melena or hematochezia.  She has no urinary complaints.  Patient feels at her baseline offers no specific complaints today.  REVIEW OF SYSTEMS:   Review of Systems  Constitutional: Negative.  Negative for fever, malaise/fatigue and weight loss.  Respiratory: Negative.  Negative for cough, hemoptysis and shortness of breath.   Cardiovascular: Negative.  Negative for chest pain and leg swelling.  Gastrointestinal: Negative.  Negative for abdominal pain and blood in stool.  Genitourinary: Negative.  Negative for hematuria.  Musculoskeletal: Negative.  Negative for back pain.  Skin: Negative.  Negative for rash.  Neurological: Negative.  Negative for dizziness, focal weakness, weakness and headaches.  Psychiatric/Behavioral: Negative.  The patient is not nervous/anxious.    As per HPI. Otherwise, a complete review of systems is negative.  PAST MEDICAL HISTORY: No past medical history on file.  PAST SURGICAL HISTORY: Past Surgical History:  Procedure Laterality Date   CHOLECYSTECTOMY  2013    FAMILY HISTORY: Family History  Problem Relation Age of Onset   Diabetes Maternal Grandmother    Lung cancer Maternal Grandfather    Kidney  disease Mother    Clotting disorder Father    Sickle cell trait Father     ADVANCED DIRECTIVES (Y/N):  N  HEALTH MAINTENANCE: Social History   Tobacco Use   Smoking status: Never   Smokeless tobacco: Never  Vaping Use   Vaping Use: Never used  Substance Use Topics   Alcohol use: Yes    Alcohol/week: 2.0 standard drinks    Types: 1 Glasses of wine, 1 Shots of liquor per week   Drug use: Never     Colonoscopy:  PAP:  Bone density:  Lipid panel:  No Known Allergies  Current Outpatient Medications  Medication Sig Dispense Refill   citalopram (CELEXA) 20 MG tablet Take 1 tablet (20 mg total) by mouth daily. 30 tablet 2   esomeprazole (NEXIUM) 40 MG capsule Take 1 capsule (40 mg total) by mouth daily as needed. One tab by mouth at dinner time. 90 capsule 3   ferrous sulfate 325 (65 FE) MG tablet Take 325 mg by mouth daily with breakfast.     Iron Sucrose (VENOFER IV) Inject 200 mg into the vein every 7 (seven) days.     No current facility-administered medications for this visit.    OBJECTIVE: Vitals:   02/09/21 1252  BP: (!) 165/105  Pulse: 89  Resp: 18  Temp: 98.4 F (36.9 C)  SpO2: 97%     Body mass index is 43.7 kg/m.    ECOG FS:0 - Asymptomatic  General: Well-developed, well-nourished, no acute distress. Eyes: Pink conjunctiva, anicteric sclera. HEENT: Normocephalic, moist mucous membranes. Lungs: No audible wheezing or coughing. Heart: Regular rate and rhythm. Abdomen: Soft, nontender, no obvious distention. Musculoskeletal:  No edema, cyanosis, or clubbing. Neuro: Alert, answering all questions appropriately. Cranial nerves grossly intact. Skin: No rashes or petechiae noted. Psych: Normal affect.   LAB RESULTS:  Lab Results  Component Value Date   NA 141 07/13/2020   K 4.2 07/13/2020   CL 103 07/13/2020   CO2 20 07/13/2020   GLUCOSE 116 (H) 07/13/2020   BUN 8 07/13/2020   CREATININE 0.77 07/13/2020   CALCIUM 9.5 07/13/2020   PROT 7.2  07/13/2020   ALBUMIN 4.3 07/13/2020   AST 13 07/13/2020   ALT 12 07/13/2020   ALKPHOS 87 07/13/2020   BILITOT <0.2 07/13/2020   GFRNONAA >60 05/29/2020    Lab Results  Component Value Date   WBC 5.8 02/01/2021   NEUTROABS 2.9 02/01/2021   HGB 11.4 (L) 02/01/2021   HCT 34.4 (L) 02/01/2021   MCV 81.7 02/01/2021   PLT 305 02/01/2021   Lab Results  Component Value Date   IRON 50 02/01/2021   TIBC 377 02/01/2021   IRONPCTSAT 13 02/01/2021   Lab Results  Component Value Date   FERRITIN 17 02/01/2021     STUDIES: No results found.  ASSESSMENT: Iron deficiency anemia.  PLAN:    1.  Iron deficiency anemia: Patient's hemoglobin has improved and is now nearly within normal limits at 11.4.  Her iron stores continue to be within normal limits.  Previously, all of her other laboratory work was either negative or within normal limits.  She does not require additional IV Venofer today.  Patient last received treatment on July 27, 2020.  Return to clinic in 6 months with repeat laboratory work and further evaluation.  If she does not require treatment at this time, patient can likely be discharged from clinic.   2.  Sickle cell trait: No intervention needed.  She does not have any children at this time.  Likely the etiology of her mild baseline anemia.     I spent a total of 20 minutes reviewing chart data, face-to-face evaluation with the patient, counseling and coordination of care as detailed above.   Patient expressed understanding and was in agreement with this plan. She also understands that She can call clinic at any time with any questions, concerns, or complaints.    Lloyd Huger, MD   02/09/2021 6:14 PM

## 2021-02-08 NOTE — Assessment & Plan Note (Signed)
Depression screen [facility] 2/9 02/08/2021 08/14/2020 07/13/2020  Decreased Interest 0 2 2  Down, Depressed, Hopeless 2 2 2   PHQ - 2 Score 2 4 4   Altered sleeping 3 2 2   Tired, decreased energy 2 1 1   Change in appetite 2 2 2   Feeling bad or failure about yourself  3 2 2   Trouble concentrating 0 1 1  Moving slowly or fidgety/restless 0 0 0  Suicidal thoughts 0 0 0  PHQ-9 Score 12 12 12   Difficult doing work/chores Very difficult Somewhat difficult Somewhat difficult   GAD 7 : Generalized Anxiety Score 02/08/2021 08/14/2020 07/13/2020  Nervous, Anxious, on Edge 0 1 1  Control/stop worrying 1 3 3   Worry too much - different things 1 2 2   Trouble relaxing 0 1 1  Restless 0 0 0  Easily annoyed or irritable 0 1 1  Afraid - awful might happen 0 0 0  Total GAD 7 Score 2 8 8   Anxiety Difficulty Somewhat difficult Somewhat difficult Somewhat difficult   Recently noted elevated PHQ scores and recently decreased GAD scores in the setting of significant life stressors including job change and loss of family member.  We had previously placed referral to psychiatry and discussed medication management.  She is amenable to consideration of medication management, Celexa 20 mg prescribed for daily dosing.  If she chooses to initiate this, she was advised to contact her office so we can coordinate a follow-up in 4 weeks time, this can be virtual.  Additionally, I have advised mindfulness and exercise therapy, and will attempt to follow-up with psychiatry pending insurance circumstances.  I have given her information for our patient assistance liaison.

## 2021-02-08 NOTE — Assessment & Plan Note (Signed)
Chronic GERD that is adequately controlled with esomeprazole 40 mg on a daily as needed basis.

## 2021-02-08 NOTE — Patient Instructions (Addendum)
- Continue esomeprazole - Can start citalopram (Celexa) and dose once daily, contact our office if starting - Please review information provided - Can contact Patient Assistance: Alm Bustard [phone]  Mindfulness-Based Stress Reduction Mindfulness-based stress reduction (MBSR) is a program that helps you learn to practice mindfulness. Mindfulness is the practice of intentionally paying attention to the present moment. MBSR focuses on developing self-awareness, which allows you to respond to life stress without judgment or negative emotions. It can be learned and practiced through techniques such as education, breathing exercises, meditation, and yoga. MBSR includes several mindfulness techniques in one program. MBSR works best when you understand the treatment, are willing to try new things, and can commit to spending time practicing what you learn.  What are the Benefits of MBSR? MBSR has been shown to have many benefits, which include helping you to: Reduce negative emotions, such as depression and anxiety. Improve memory and focus. Change how you sense and approach pain. Boost your body's ability to fight infections. Help you connect better with other people. Improve your sense of well-being. Follow These Instructions at Home:  Find a local in-person or online MBSR program. Set aside some time regularly for mindfulness practice. Find a mindfulness practice that works best for you. This may include one or more of the following: Meditation. Meditation involves focusing your mind on a certain thought or activity. Breathing awareness exercises. These help you to stay present by focusing on your breath. Body scan. For this practice, you lie down and pay attention to each part of your body from head to toe. You can identify tension and soreness and intentionally relax parts of your body. Yoga. Yoga involves stretching and breathing, and it can improve your ability to move and be flexible.  It can also provide an experience of testing your body's limits, which can help you release stress. Mindful eating. This way of eating involves focusing on the taste, texture, color, and smell of each bite of food. Because this slows down eating and helps you feel full sooner, it can be an important part of a weight-loss plan. Find a podcast or recording that provides guidance for breathing awareness, body scan, or meditation exercises. You can listen to these any time when you have a free moment to rest without distractions. Follow your treatment plan as told by your health care provider. This may include taking regular medicines and making changes to your diet or lifestyle as recommended. How to Practice Mindfulness: To do a basic awareness exercise: Find a comfortable place to sit or lay down. Pay attention to the present moment. Observe your thoughts, feelings, and surroundings just as they are. Avoid placing judgment on yourself, your feelings, or your surroundings. Make note of any judgment that comes up, and let it go. Your mind may wander, and that is okay. Make note of when your thoughts drift, and return your attention to the present moment. To do basic mindfulness meditation: Find a comfortable place to sit or lay down. This may include a stable chair, your bed, or a firm floor cushion. Sit upright with your back straight. Let your arms fall next to your side with your hands resting on your legs. If sitting in a chair, rest your feet flat on the floor. If sitting on a cushion, cross your legs in front of you. If laying down, let your limbs fully relax. Keep your head in a neutral position with your chin dropped slightly. Relax your jaw and rest the tip of  your tongue on the roof of your mouth. Drop your gaze to the floor. You can close your eyes if you like. Breathe normally and pay attention to your breath. Feel the air moving in and out of your nose. Feel your belly expanding and  relaxing with each breath. Your mind may wander, and that is okay. Make note of when your thoughts drift, and return your attention to your breath. Avoid placing judgment on yourself, your feelings, or your surroundings. Make note of any judgment or feelings that come up, let them go, and bring your attention back to your breath. When you are ready, lift your gaze or open your eyes. Pay attention to how your body feels after the meditation.

## 2021-02-08 NOTE — Assessment & Plan Note (Signed)
Being actively managed by hematology, this is in the setting of reported heavy menses and sickle cell trait.  She does have upcoming follow-up with their group and, per patient, infusion.  We will follow peripherally on this issue.

## 2021-02-08 NOTE — Progress Notes (Signed)
Primary Care / Sports Medicine Office Visit  Patient Information:  Patient ID: Eileen Vargas, female DOB: Feb 19, 1991 Age: 30 y.o. MRN: [id number]   Eileen Vargas is a pleasant 30 y.o. female presenting with the following:  Chief Complaint  Patient presents with   Follow-up    6 months   Gastroesophageal Reflux    Tolerating esomeprazole well   Anxiety and depression    Unable to follow with psychiatry due to financial restrictions being self pay; not taking mood stabilizing medications    Review of Systems pertinent details above   Patient Active Problem List   Diagnosis Date Noted   Hypertriglyceridemia 02/08/2021   Anxiety and depression 07/13/2020   Gastroesophageal reflux disease without esophagitis 07/13/2020   Fibroid 07/13/2020   Class 3 severe obesity due to excess calories with body mass index (BMI) of 45.0 to 49.9 in adult ([place]) 07/13/2020   Sickle cell trait ([place]) 07/01/2020   Iron deficiency anemia 06/06/2020   History reviewed. No pertinent past medical history.  Outpatient Encounter Medications as of 02/08/2021  Medication Sig   citalopram (CELEXA) 20 MG tablet Take 1 tablet (20 mg total) by mouth daily.   ferrous sulfate 325 (65 FE) MG tablet Take 325 mg by mouth daily with breakfast.   Iron Sucrose (VENOFER IV) Inject 200 mg into the vein every 7 (seven) days.   [DISCONTINUED] esomeprazole (NEXIUM) 40 MG capsule Take 1 capsule (40 mg total) by mouth daily as needed. One tab by mouth at dinner time.   esomeprazole (NEXIUM) 40 MG capsule Take 1 capsule (40 mg total) by mouth daily as needed. One tab by mouth at dinner time.   No facility-administered encounter medications on file as of 02/08/2021.   Past Surgical History:  Procedure Laterality Date   CHOLECYSTECTOMY  2013    Vitals:   02/08/21 0808  BP: 124/88  Pulse: 64  SpO2: 97%   Vitals:   02/08/21 0808  Weight: 251 lb (113.9 kg)  Height: 5\' 4"  (1.626 m)   Body mass index is 43.08  kg/m.  No results found.   Independent interpretation of notes and tests performed by another provider:   None  Procedures performed:   None  Pertinent History, Exam, Impression, and Recommendations:   Anxiety and depression Depression screen [facility] 2/9 02/08/2021 08/14/2020 07/13/2020  Decreased Interest 0 2 2  Down, Depressed, Hopeless 2 2 2   PHQ - 2 Score 2 4 4   Altered sleeping 3 2 2   Tired, decreased energy 2 1 1   Change in appetite 2 2 2   Feeling bad or failure about yourself  3 2 2   Trouble concentrating 0 1 1  Moving slowly or fidgety/restless 0 0 0  Suicidal thoughts 0 0 0  PHQ-9 Score 12 12 12   Difficult doing work/chores Very difficult Somewhat difficult Somewhat difficult   GAD 7 : Generalized Anxiety Score 02/08/2021 08/14/2020 07/13/2020  Nervous, Anxious, on Edge 0 1 1  Control/stop worrying 1 3 3   Worry too much - different things 1 2 2   Trouble relaxing 0 1 1  Restless 0 0 0  Easily annoyed or irritable 0 1 1  Afraid - awful might happen 0 0 0  Total GAD 7 Score 2 8 8   Anxiety Difficulty Somewhat difficult Somewhat difficult Somewhat difficult   Recently noted elevated PHQ scores and recently decreased GAD scores in the setting of significant life stressors including job change and loss of family member.  We had previously  placed referral to psychiatry and discussed medication management.  She is amenable to consideration of medication management, Celexa 20 mg prescribed for daily dosing.  If she chooses to initiate this, she was advised to contact her office so we can coordinate a follow-up in 4 weeks time, this can be virtual.  Additionally, I have advised mindfulness and exercise therapy, and will attempt to follow-up with psychiatry pending insurance circumstances.  I have given her information for our patient assistance liaison.  Iron deficiency anemia Being actively managed by hematology, this is in the setting of reported heavy menses and sickle cell  trait.  She does have upcoming follow-up with their group and, per patient, infusion.  We will follow peripherally on this issue.  Gastroesophageal reflux disease without esophagitis Chronic GERD that is adequately controlled with esomeprazole 40 mg on a daily as needed basis.  Class 3 severe obesity due to excess calories with body mass index (BMI) of 45.0 to 49.9 in adult [facility]) Healthy lifestyle measures discussed, we will hold from repeat labs until patient has a chance to talk to patient assistance liaison.  Hypertriglyceridemia See additional assessment(s) for plan details.    Orders & Medications Meds ordered this encounter  Medications   esomeprazole (NEXIUM) 40 MG capsule    Sig: Take 1 capsule (40 mg total) by mouth daily as needed. One tab by mouth at dinner time.    Dispense:  90 capsule    Refill:  3   citalopram (CELEXA) 20 MG tablet    Sig: Take 1 tablet (20 mg total) by mouth daily.    Dispense:  30 tablet    Refill:  2   No orders of the defined types were placed in this encounter.    Return in about 1 year (around 02/08/2022).     Montel Culver, MD   Primary Care Sports Medicine [place]

## 2021-02-09 ENCOUNTER — Inpatient Hospital Stay: Payer: Self-pay

## 2021-02-09 ENCOUNTER — Inpatient Hospital Stay (HOSPITAL_BASED_OUTPATIENT_CLINIC_OR_DEPARTMENT_OTHER): Payer: Self-pay | Admitting: Oncology

## 2021-02-09 VITALS — BP 165/105 | HR 89 | Temp 98.4°F | Resp 18 | Wt 254.6 lb

## 2021-02-09 DIAGNOSIS — D508 Other iron deficiency anemias: Secondary | ICD-10-CM

## 2021-02-09 NOTE — Progress Notes (Signed)
Pt has no concerns or complaints at this time.

## 2021-03-16 ENCOUNTER — Encounter: Payer: Self-pay | Admitting: Family Medicine

## 2021-03-16 DIAGNOSIS — F32A Depression, unspecified: Secondary | ICD-10-CM

## 2021-03-16 DIAGNOSIS — F419 Anxiety disorder, unspecified: Secondary | ICD-10-CM

## 2021-03-17 MED ORDER — CITALOPRAM HYDROBROMIDE 20 MG PO TABS
20.0000 mg | ORAL_TABLET | Freq: Every day | ORAL | 2 refills | Status: DC
Start: 2021-03-17 — End: 2021-07-02

## 2021-05-21 ENCOUNTER — Encounter: Payer: Self-pay | Admitting: Oncology

## 2021-07-02 ENCOUNTER — Other Ambulatory Visit: Payer: Self-pay | Admitting: Family Medicine

## 2021-07-02 DIAGNOSIS — F32A Depression, unspecified: Secondary | ICD-10-CM

## 2021-07-02 NOTE — Telephone Encounter (Signed)
Requested Prescriptions  ?Pending Prescriptions Disp Refills  ?? citalopram (CELEXA) 20 MG tablet [Pharmacy Med Name: CITALOPRAM '20MG'$  TABLETS] 30 tablet 2  ?  Sig: TAKE 1 TABLET(20 MG) BY MOUTH DAILY  ?  ? Psychiatry:  Antidepressants - SSRI Passed - 07/02/2021  3:30 AM  ?  ?  Passed - Completed PHQ-2 or PHQ-9 in the last 360 days  ?  ?  Passed - Valid encounter within last 6 months  ?  Recent Outpatient Visits   ?      ? 4 months ago Gastroesophageal reflux disease without esophagitis  ? [facility] Montel Culver, MD  ? 10 months ago Gastroesophageal reflux disease without esophagitis  ? [facility] Montel Culver, MD  ? 11 months ago Routine physical examination  ? [facility] Montel Culver, MD  ?  ?  ?Future Appointments   ?        ? In 7 months Zigmund Daniel Earley Abide, MD [facility] [place]  ?  ? ?  ?  ?  ? ?

## 2021-08-09 ENCOUNTER — Inpatient Hospital Stay: Payer: 59 | Attending: Nurse Practitioner

## 2021-08-09 ENCOUNTER — Other Ambulatory Visit: Payer: Self-pay

## 2021-08-09 DIAGNOSIS — Z801 Family history of malignant neoplasm of trachea, bronchus and lung: Secondary | ICD-10-CM | POA: Diagnosis not present

## 2021-08-09 DIAGNOSIS — E781 Pure hyperglyceridemia: Secondary | ICD-10-CM | POA: Insufficient documentation

## 2021-08-09 DIAGNOSIS — D573 Sickle-cell trait: Secondary | ICD-10-CM | POA: Diagnosis not present

## 2021-08-09 DIAGNOSIS — Z79899 Other long term (current) drug therapy: Secondary | ICD-10-CM | POA: Insufficient documentation

## 2021-08-09 DIAGNOSIS — D509 Iron deficiency anemia, unspecified: Secondary | ICD-10-CM

## 2021-08-09 DIAGNOSIS — R531 Weakness: Secondary | ICD-10-CM | POA: Diagnosis not present

## 2021-08-09 DIAGNOSIS — K219 Gastro-esophageal reflux disease without esophagitis: Secondary | ICD-10-CM | POA: Insufficient documentation

## 2021-08-09 DIAGNOSIS — N920 Excessive and frequent menstruation with regular cycle: Secondary | ICD-10-CM | POA: Diagnosis not present

## 2021-08-09 DIAGNOSIS — R5383 Other fatigue: Secondary | ICD-10-CM | POA: Insufficient documentation

## 2021-08-09 DIAGNOSIS — E669 Obesity, unspecified: Secondary | ICD-10-CM | POA: Insufficient documentation

## 2021-08-09 LAB — CBC WITH DIFFERENTIAL/PLATELET
Abs Immature Granulocytes: 0.01 10*3/uL (ref 0.00–0.07)
Basophils Absolute: 0 10*3/uL (ref 0.0–0.1)
Basophils Relative: 1 %
Eosinophils Absolute: 0.1 10*3/uL (ref 0.0–0.5)
Eosinophils Relative: 2 %
HCT: 28.8 % — ABNORMAL LOW (ref 36.0–46.0)
Hemoglobin: 8.6 g/dL — ABNORMAL LOW (ref 12.0–15.0)
Immature Granulocytes: 0 %
Lymphocytes Relative: 44 %
Lymphs Abs: 2.3 10*3/uL (ref 0.7–4.0)
MCH: 20.9 pg — ABNORMAL LOW (ref 26.0–34.0)
MCHC: 29.9 g/dL — ABNORMAL LOW (ref 30.0–36.0)
MCV: 70.1 fL — ABNORMAL LOW (ref 80.0–100.0)
Monocytes Absolute: 0.4 10*3/uL (ref 0.1–1.0)
Monocytes Relative: 8 %
Neutro Abs: 2.4 10*3/uL (ref 1.7–7.7)
Neutrophils Relative %: 45 %
Platelets: 344 10*3/uL (ref 150–400)
RBC: 4.11 MIL/uL (ref 3.87–5.11)
RDW: 16.8 % — ABNORMAL HIGH (ref 11.5–15.5)
WBC: 5.3 10*3/uL (ref 4.0–10.5)
nRBC: 0 % (ref 0.0–0.2)

## 2021-08-09 LAB — IRON AND TIBC
Iron: 20 ug/dL — ABNORMAL LOW (ref 28–170)
Saturation Ratios: 5 % — ABNORMAL LOW (ref 10.4–31.8)
TIBC: 438 ug/dL (ref 250–450)
UIBC: 418 ug/dL

## 2021-08-09 LAB — FERRITIN: Ferritin: 5 ng/mL — ABNORMAL LOW (ref 11–307)

## 2021-08-10 ENCOUNTER — Inpatient Hospital Stay: Payer: 59

## 2021-08-10 ENCOUNTER — Inpatient Hospital Stay (HOSPITAL_BASED_OUTPATIENT_CLINIC_OR_DEPARTMENT_OTHER): Payer: 59 | Admitting: Nurse Practitioner

## 2021-08-10 ENCOUNTER — Other Ambulatory Visit: Payer: Self-pay | Admitting: Family Medicine

## 2021-08-10 ENCOUNTER — Encounter: Payer: Self-pay | Admitting: Oncology

## 2021-08-10 VITALS — BP 121/74 | HR 75 | Temp 97.2°F | Resp 18 | Wt 253.0 lb

## 2021-08-10 VITALS — BP 116/73 | HR 73

## 2021-08-10 DIAGNOSIS — Z79899 Other long term (current) drug therapy: Secondary | ICD-10-CM | POA: Diagnosis not present

## 2021-08-10 DIAGNOSIS — Z801 Family history of malignant neoplasm of trachea, bronchus and lung: Secondary | ICD-10-CM | POA: Diagnosis not present

## 2021-08-10 DIAGNOSIS — D259 Leiomyoma of uterus, unspecified: Secondary | ICD-10-CM | POA: Diagnosis not present

## 2021-08-10 DIAGNOSIS — N920 Excessive and frequent menstruation with regular cycle: Secondary | ICD-10-CM | POA: Diagnosis not present

## 2021-08-10 DIAGNOSIS — K219 Gastro-esophageal reflux disease without esophagitis: Secondary | ICD-10-CM | POA: Diagnosis not present

## 2021-08-10 DIAGNOSIS — D509 Iron deficiency anemia, unspecified: Secondary | ICD-10-CM | POA: Diagnosis not present

## 2021-08-10 DIAGNOSIS — D508 Other iron deficiency anemias: Secondary | ICD-10-CM

## 2021-08-10 DIAGNOSIS — D573 Sickle-cell trait: Secondary | ICD-10-CM | POA: Diagnosis not present

## 2021-08-10 DIAGNOSIS — R5383 Other fatigue: Secondary | ICD-10-CM | POA: Diagnosis not present

## 2021-08-10 DIAGNOSIS — E669 Obesity, unspecified: Secondary | ICD-10-CM | POA: Diagnosis not present

## 2021-08-10 DIAGNOSIS — E781 Pure hyperglyceridemia: Secondary | ICD-10-CM | POA: Diagnosis not present

## 2021-08-10 DIAGNOSIS — R531 Weakness: Secondary | ICD-10-CM | POA: Diagnosis not present

## 2021-08-10 MED ORDER — SODIUM CHLORIDE 0.9 % IV SOLN
Freq: Once | INTRAVENOUS | Status: AC
  Filled 2021-08-10: qty 250

## 2021-08-10 MED ORDER — SODIUM CHLORIDE 0.9 % IV SOLN: 200.0000 mg | Freq: Once | INTRAVENOUS | Status: DC

## 2021-08-10 MED ORDER — IRON SUCROSE 20 MG/ML IV SOLN
200.0000 mg | Freq: Once | INTRAVENOUS | Status: AC
  Administered 2021-08-10: 200 mg via INTRAVENOUS
  Filled 2021-08-10: qty 10

## 2021-08-10 NOTE — Telephone Encounter (Signed)
Requested medications are due for refill today.  Unsure ? ?Requested medications are on the active medications list.  no ? ?Last refill. none ? ?Future visit scheduled.   yes ? ?Notes to clinic.  Med not on med list. ? ? ? ?Requested Prescriptions  ?Pending Prescriptions Disp Refills  ? omeprazole (PRILOSEC) 20 MG capsule [Pharmacy Med Name: OMEPRAZOLE DR 20 MG CAPSULE] 30 capsule   ?  Sig: TAKE 1 CAPSULE BY MOUTH DAILY  ?  ? Gastroenterology: Proton Pump Inhibitors Passed - 08/10/2021  2:36 AM  ?  ?  Passed - Valid encounter within last 12 months  ?  Recent Outpatient Visits   ? ?      ? 6 months ago Gastroesophageal reflux disease without esophagitis  ? [facility] Montel Culver, MD  ? 12 months ago Gastroesophageal reflux disease without esophagitis  ? [facility] Montel Culver, MD  ? 1 year ago Routine physical examination  ? [facility] Montel Culver, MD  ? ?  ?  ?Future Appointments   ? ?        ? In 2 days Zigmund Daniel, Earley Abide, MD [facility] [place] [facility]  ? In 6 months Zigmund Daniel, Earley Abide, MD [facility] [place]  ? ?  ? ? ?  ?  ?  ?  ?

## 2021-08-10 NOTE — Progress Notes (Signed)
?[place]  ?[phone] [phone] ? ?ID: Durenda Guthrie OB: [birth year]  MR#: [id number]  LTJ#:[id number] ? ?Patient Care Team: ?Montel Culver, MD as PCP - General (Family Medicine) ? ?CHIEF COMPLAINT: Iron deficiency anemia. ? ?INTERVAL HISTORY: Patient returns to clinic today for discussion of lab results, further evaluation, and consideration of additional IV venofer. She has been taking oral iron and trying to increase intake of iron rich foods. She continues to have heavy bleeding secondary to known fibroids. She has not followed up with gynecology for management. She reports fatigue, lethargy, and ice pica. She has no neurologic complaints.  She denies any recent fevers or illnesses.  She has a good appetite and denies weight loss.  She denies any weakness or fatigue. She has no chest pain, shortness of breath, cough, or hemoptysis.  She denies any nausea, vomiting, constipation, or diarrhea.  She has no melena or hematochezia.  She has no urinary complaints.  Patient feels at her baseline offers no specific complaints today. ? ?REVIEW OF SYSTEMS:   ?Review of Systems  ?Constitutional:  Positive for malaise/fatigue. Negative for chills, fever and weight loss.  ?HENT:  Negative for congestion, ear discharge, ear pain, sinus pain, sore throat and tinnitus.   ?Eyes: Negative.   ?Respiratory: Negative.  Negative for cough, sputum production and shortness of breath.   ?Cardiovascular:  Negative for chest pain, palpitations, orthopnea, claudication and leg swelling.  ?Gastrointestinal:  Negative for abdominal pain, blood in stool, constipation, diarrhea, heartburn, nausea and vomiting.  ?Genitourinary: Negative.  Negative for frequency and hematuria.  ?Musculoskeletal: Negative.  Negative for back pain, joint pain, myalgias and neck pain.  ?Skin: Negative.   ?Neurological:  Negative for dizziness, tingling, weakness and headaches.  ?Endo/Heme/Allergies: Negative.    ?Psychiatric/Behavioral: Negative.  Negative for depression. The patient is not nervous/anxious.   ?As per HPI. Otherwise, a complete review of systems is negative. ? ?PAST MEDICAL HISTORY: ?No past medical history on file. ? ?Patient Active Problem List  ? Diagnosis Date Noted  ? Hypertriglyceridemia 02/08/2021  ? Anxiety and depression 07/13/2020  ? Gastroesophageal reflux disease without esophagitis 07/13/2020  ? Fibroid 07/13/2020  ? Class 3 severe obesity due to excess calories with body mass index (BMI) of 45.0 to 49.9 in adult [facility]) 07/13/2020  ? Sickle cell trait ([place]) 07/01/2020  ? Iron deficiency anemia 06/06/2020  ? ?PAST SURGICAL HISTORY: ?Past Surgical History:  ?Procedure Laterality Date  ? CHOLECYSTECTOMY  2013  ? ? ?FAMILY HISTORY: ?Family History  ?Problem Relation Age of Onset  ? Diabetes Maternal Grandmother   ? Lung cancer Maternal Grandfather   ? Kidney disease Mother   ? Clotting disorder Father   ? Sickle cell trait Father   ? ? ?ADVANCED DIRECTIVES (Y/N):  N ? ?HEALTH MAINTENANCE: ?Social History  ? ?Tobacco Use  ? Smoking status: Never  ? Smokeless tobacco: Never  ?Vaping Use  ? Vaping Use: Never used  ?Substance Use Topics  ? Alcohol use: Yes  ?  Alcohol/week: 2.0 standard drinks  ?  Types: 1 Glasses of wine, 1 Shots of liquor per week  ? Drug use: Never  ? ? Colonoscopy: ? PAP: ? Bone density: ? Lipid panel: ? ?No Known Allergies ? ?Current Outpatient Medications  ?Medication Sig Dispense Refill  ? citalopram (CELEXA) 20 MG tablet TAKE 1 TABLET(20 MG) BY MOUTH DAILY 30 tablet 2  ? esomeprazole (NEXIUM) 40 MG capsule Take 1 capsule (40 mg total) by mouth daily as needed.  One tab by mouth at dinner time. 90 capsule 3  ? ferrous sulfate 325 (65 FE) MG tablet Take 325 mg by mouth daily with breakfast.    ? Iron Sucrose (VENOFER IV) Inject 200 mg into the vein every 7 (seven) days.    ? ?No current facility-administered medications for this visit.  ? ? ?OBJECTIVE: ?Vitals:  ? 08/10/21 1312   ?BP: 121/74  ?Pulse: 75  ?Resp: 18  ?Temp: (!) 97.2 ?F (36.2 ?C)  ?   Body mass index is 43.43 kg/m?Marland Kitchen    ECOG FS:1 - Symptomatic but completely ambulatory ? ?General: Well-developed, well-nourished, no acute distress. ?Eyes: Pink conjunctiva, anicteric sclera. ?HEENT: Normocephalic, moist mucous membranes. ?Lungs: No audible wheezing or coughing. ?Heart: Regular rate and rhythm. ?Abdomen: Soft, nontender, no obvious distention. ?Musculoskeletal: No edema, cyanosis, or clubbing. ?Neuro: Alert, answering all questions appropriately. Cranial nerves grossly intact. ?Skin: No rashes or petechiae noted. ?Psych: Normal affect. ? ? ?LAB RESULTS: ? ?Lab Results  ?Component Value Date  ? NA 141 07/13/2020  ? K 4.2 07/13/2020  ? CL 103 07/13/2020  ? CO2 20 07/13/2020  ? GLUCOSE 116 (H) 07/13/2020  ? BUN 8 07/13/2020  ? CREATININE 0.77 07/13/2020  ? CALCIUM 9.5 07/13/2020  ? PROT 7.2 07/13/2020  ? ALBUMIN 4.3 07/13/2020  ? AST 13 07/13/2020  ? ALT 12 07/13/2020  ? ALKPHOS 87 07/13/2020  ? BILITOT <0.2 07/13/2020  ? GFRNONAA >60 05/29/2020  ? ? ?Lab Results  ?Component Value Date  ? WBC 5.3 08/09/2021  ? NEUTROABS 2.4 08/09/2021  ? HGB 8.6 (L) 08/09/2021  ? HCT 28.8 (L) 08/09/2021  ? MCV 70.1 (L) 08/09/2021  ? PLT 344 08/09/2021  ? ?Lab Results  ?Component Value Date  ? IRON 20 (L) 08/09/2021  ? TIBC 438 08/09/2021  ? IRONPCTSAT 5 (L) 08/09/2021  ? ?Lab Results  ?Component Value Date  ? FERRITIN 5 (L) 08/09/2021  ? ? ?STUDIES: ?No results found. ? ?ASSESSMENT: Iron deficiency anemia. ? ?PLAN:   ? ?1.  Iron deficiency anemia: Patient's hemoglobin has worsened to 8.6. Her iron stores have also dropped, ferritin 5, iron sat 5%. IDA secondary to blood loss in context of uterine fibroids. She is symptomatic. She last received iv iron 07/27/20. Reviewed that iron stores are depleted despite oral iron. Recommend IV iron. Recommend Venofer x 5. She will receive first dose today. Return to clinic in 3 months with repeat laboratory work  and further evaluation.  ? ?2.  Sickle cell trait: No intervention needed.  She does not have any children at this time.  Likely the etiology of her mild baseline anemia. ? ?3. Heavy menses- hx of fibroids. Previously saw Dr. Georgianne Fick and discussed options for management in March 2022. Has not followed up. We again reviewed options.  ? ?Disposition: ?Venofer x 5 ?RTC in 3 months for labs (cbc, ferritin, iron studies) ?Day later see Dr. Grayland Ormond, +/- venofer- la ? ?I spent a total of 30 minutes reviewing chart data, face-to-face evaluation with the patient, counseling and coordination of care as detailed above. ? ?Patient expressed understanding and was in agreement with this plan. She also understands that She can call clinic at any time with any questions, concerns, or complaints.  ? ?Verlon Au, NP   08/10/2021  ? ?

## 2021-08-11 NOTE — Progress Notes (Addendum)
Patient tolerated Venofer infusion. IV blew/occluded at start of infusion. IV replaced. Patient denies pain or swelling. Site clean, dry, intact. Patient stable at discharge. No concerns. Refused AVS.   ?

## 2021-08-11 NOTE — Patient Instructions (Signed)

## 2021-08-12 ENCOUNTER — Encounter: Payer: Self-pay | Admitting: Oncology

## 2021-08-12 ENCOUNTER — Telehealth (INDEPENDENT_AMBULATORY_CARE_PROVIDER_SITE_OTHER): Payer: 59 | Admitting: Family Medicine

## 2021-08-12 ENCOUNTER — Encounter: Payer: Self-pay | Admitting: Family Medicine

## 2021-08-12 VITALS — Ht 64.0 in

## 2021-08-12 DIAGNOSIS — M5417 Radiculopathy, lumbosacral region: Secondary | ICD-10-CM

## 2021-08-12 DIAGNOSIS — K219 Gastro-esophageal reflux disease without esophagitis: Secondary | ICD-10-CM

## 2021-08-12 MED ORDER — ESOMEPRAZOLE MAGNESIUM 40 MG PO CPDR: 40.0000 mg | DELAYED_RELEASE_CAPSULE | Freq: Every day | ORAL | 0 refills | Status: DC | PRN

## 2021-08-12 MED ORDER — MELOXICAM 15 MG PO TABS: 15.0000 mg | ORAL_TABLET | Freq: Every day | ORAL | 0 refills | Status: DC

## 2021-08-12 NOTE — Progress Notes (Signed)
?  ? ?Primary Care / Sports Medicine Virtual Visit ? ?Patient Information:  ?Patient ID: Eileen Vargas, female DOB: 10-12-1990 Age: 31 y.o. MRN: [id number]  ? ?Eileen Vargas is a pleasant 31 y.o. female presenting with the following: ? ?Chief Complaint  ?Patient presents with  ? Nerve Pain  ?  X 2-3 weeks, Right leg, no injuries, tried aleve and tylenol did not help, pain shoots up and down leg, no numbness or tingling, 7 pain scale, first time having nerve pain   ? ? ?Review of Systems: No fevers, chills, night sweats, weight loss, chest pain, or shortness of breath.  ? ?Patient Active Problem List  ? Diagnosis Date Noted  ? Right lumbosacral radiculopathy 08/12/2021  ? Fibroid, uterine 08/10/2021  ? Hypertriglyceridemia 02/08/2021  ? Anxiety and depression 07/13/2020  ? Gastroesophageal reflux disease without esophagitis 07/13/2020  ? Fibroid 07/13/2020  ? Class 3 severe obesity due to excess calories with body mass index (BMI) of 45.0 to 49.9 in adult [facility]) 07/13/2020  ? Sickle cell trait ([place]) 07/01/2020  ? Iron deficiency anemia 06/06/2020  ? ?History reviewed. No pertinent past medical history. ?Outpatient Encounter Medications as of 08/12/2021  ?Medication Sig  ? citalopram (CELEXA) 20 MG tablet TAKE 1 TABLET(20 MG) BY MOUTH DAILY  ? ferrous sulfate 325 (65 FE) MG tablet Take 325 mg by mouth daily with breakfast.  ? Iron Sucrose (VENOFER IV) Inject 200 mg into the vein every 7 (seven) days.  ? meloxicam (MOBIC) 15 MG tablet Take 1 tablet (15 mg total) by mouth daily.  ? [DISCONTINUED] esomeprazole (NEXIUM) 40 MG capsule Take 1 capsule (40 mg total) by mouth daily as needed. One tab by mouth at dinner time.  ? esomeprazole (NEXIUM) 40 MG capsule Take 1 capsule (40 mg total) by mouth daily as needed. One tab by mouth at dinner time.  ? ?No facility-administered encounter medications on file as of 08/12/2021.  ? ?Past Surgical History:  ?Procedure Laterality Date  ? CHOLECYSTECTOMY  2013  ? ? ?Virtual Visit via  MyChart Video:  ? ?I connected with Eileen Vargas on 08/12/21 via MyChart Video and verified that I am speaking with the correct person using appropriate identifiers. ?  ?The limitations, risks, security and privacy concerns of performing an evaluation and management service by MyChart Video, including the higher likelihood of inaccurate diagnoses and treatments, and the availability of in person appointments were reviewed. The possible need of an additional face-to-face encounter for complete and high quality delivery of care was discussed. The patient was also made aware that there may be a patient responsible charge related to this service. The patient expressed understanding and wishes to proceed. ? ?Provider location is in medical facility. ?Patient location is at their home, different from provider location. ?People involved in care of the patient during this telehealth encounter were myself, my nurse/medical assistant, and my front office/scheduling team member. ? ?Objective findings:  ? ?General: Speaking full sentences, no audible heavy breathing. Sounds alert and appropriately interactive. Well-appearing. Face symmetric. Extraocular movements intact. Pupils equal and round. No nasal flaring or accessory muscle use visualized. ? ?Independent interpretation of notes and tests performed by another provider:  ? ?None ? ?Pertinent History, Exam, Impression, and Recommendations:  ? ?Gastroesophageal reflux disease without esophagitis ?Chronic issue that has been relatively well controlled.  Given comorbid lumbosacral right-sided radiculopathy and need for short course of NSAID I have advised transition from as needed esomeprazole to daily scheduled while she is on meloxicam.  She  does have a history of iron deficiency anemia as well, outside notes from hematology dated 02/09/2021 and 08/10/2021 were reviewed for updates on this comorbid related medical condition. ? ?Right lumbosacral radiculopathy ?Patient with  2-3-week history of acute and atraumatic lower lumbar pain with right-sided radiation of symptoms to the mid calf.  Pain is only noted with sitting, alleviated by standing and gentle activity.  Denies any specific relative overuse preceding the symptoms and denies any similar symptomatology in the past.  Examination limited due to nature of telemedicine however patient was talked through modified/seated straight leg raise and this is positive, nontender throughout the paraspinal and gluteal musculature, able to rise out of seat without asymmetry/motor deficit. ? ?Given her stated history, symptoms, and findings today, concern for lumbosacral disc disorder and right-sided associated radiculopathy.  I reviewed the same with the patient.  Plan for 2-week short course of scheduled meloxicam, given her comorbid anemia and reflux I have advised her to change her as needed omeprazole to once daily scheduled dosing.  Additionally we will coordinate a follow-up where she is to obtain x-rays of her lumbar spine if symptoms persist prior to that visit. ? ? ? ?Orders & Medications ?Meds ordered this encounter  ?Medications  ? meloxicam (MOBIC) 15 MG tablet  ?  Sig: Take 1 tablet (15 mg total) by mouth daily.  ?  Dispense:  15 tablet  ?  Refill:  0  ? esomeprazole (NEXIUM) 40 MG capsule  ?  Sig: Take 1 capsule (40 mg total) by mouth daily as needed. One tab by mouth at dinner time.  ?  Dispense:  90 capsule  ?  Refill:  0  ? ?Orders Placed This Encounter  ?Procedures  ? DG Lumbar Spine Complete  ?  ? ?I discussed the above assessment and treatment plan with the patient. The patient was provided an opportunity to ask questions and all were answered. The patient agreed with the plan and demonstrated an understanding of the instructions. ?  ?The patient was advised to call back or seek an in-person evaluation if the symptoms worsen or if the condition fails to improve as anticipated. ? ?  ? ?Montel Culver, MD ? ? Primary Care  Sports Medicine ?[facility] ?[place]  ? ?

## 2021-08-12 NOTE — Assessment & Plan Note (Signed)
Chronic issue that has been relatively well controlled.  Given comorbid lumbosacral right-sided radiculopathy and need for short course of NSAID I have advised transition from as needed esomeprazole to daily scheduled while she is on meloxicam.  She does have a history of iron deficiency anemia as well, outside notes from hematology dated 02/09/2021 and 08/10/2021 were reviewed for updates on this comorbid related medical condition. ?

## 2021-08-12 NOTE — Assessment & Plan Note (Addendum)
Patient with 2-3-week history of acute and atraumatic lower lumbar pain with right-sided radiation of symptoms to the mid calf.  Pain is only noted with sitting, alleviated by standing and gentle activity.  Denies any specific relative overuse preceding the symptoms and denies any similar symptomatology in the past.  Examination limited due to nature of telemedicine however patient was talked through modified/seated straight leg raise and this is positive, nontender throughout the paraspinal and gluteal musculature, able to rise out of seat without asymmetry/motor deficit. ? ?Given her stated history, symptoms, and findings today, concern for lumbosacral disc disorder and right-sided associated radiculopathy.  I reviewed the same with the patient.  Plan for 2-week short course of scheduled meloxicam, given her comorbid anemia and reflux I have advised her to change her as needed omeprazole to once daily scheduled dosing.  Additionally we will coordinate a follow-up where she is to obtain x-rays of her lumbar spine if symptoms persist prior to that visit. ? ? ?

## 2021-08-13 ENCOUNTER — Inpatient Hospital Stay: Payer: 59

## 2021-08-13 DIAGNOSIS — D508 Other iron deficiency anemias: Secondary | ICD-10-CM

## 2021-08-13 DIAGNOSIS — Z79899 Other long term (current) drug therapy: Secondary | ICD-10-CM | POA: Diagnosis not present

## 2021-08-13 DIAGNOSIS — K219 Gastro-esophageal reflux disease without esophagitis: Secondary | ICD-10-CM | POA: Diagnosis not present

## 2021-08-13 DIAGNOSIS — E669 Obesity, unspecified: Secondary | ICD-10-CM | POA: Diagnosis not present

## 2021-08-13 DIAGNOSIS — E781 Pure hyperglyceridemia: Secondary | ICD-10-CM | POA: Diagnosis not present

## 2021-08-13 DIAGNOSIS — N920 Excessive and frequent menstruation with regular cycle: Secondary | ICD-10-CM | POA: Diagnosis not present

## 2021-08-13 DIAGNOSIS — Z801 Family history of malignant neoplasm of trachea, bronchus and lung: Secondary | ICD-10-CM | POA: Diagnosis not present

## 2021-08-13 DIAGNOSIS — R531 Weakness: Secondary | ICD-10-CM | POA: Diagnosis not present

## 2021-08-13 DIAGNOSIS — D509 Iron deficiency anemia, unspecified: Secondary | ICD-10-CM | POA: Diagnosis not present

## 2021-08-13 DIAGNOSIS — D573 Sickle-cell trait: Secondary | ICD-10-CM | POA: Diagnosis not present

## 2021-08-13 DIAGNOSIS — R5383 Other fatigue: Secondary | ICD-10-CM | POA: Diagnosis not present

## 2021-08-13 MED ORDER — SODIUM CHLORIDE 0.9 % IV SOLN
INTRAVENOUS | Status: DC
  Filled 2021-08-13: qty 250

## 2021-08-13 MED ORDER — IRON SUCROSE 20 MG/ML IV SOLN
200.0000 mg | Freq: Once | INTRAVENOUS | Status: AC
  Administered 2021-08-13: 200 mg via INTRAVENOUS
  Filled 2021-08-13: qty 10

## 2021-08-13 MED ORDER — SODIUM CHLORIDE 0.9 % IV SOLN: 200.0000 mg | Freq: Once | INTRAVENOUS | Status: DC

## 2021-08-16 ENCOUNTER — Inpatient Hospital Stay: Payer: 59

## 2021-08-16 VITALS — BP 127/80 | HR 75 | Temp 98.5°F

## 2021-08-16 DIAGNOSIS — N920 Excessive and frequent menstruation with regular cycle: Secondary | ICD-10-CM | POA: Diagnosis not present

## 2021-08-16 DIAGNOSIS — D509 Iron deficiency anemia, unspecified: Secondary | ICD-10-CM | POA: Diagnosis not present

## 2021-08-16 DIAGNOSIS — R531 Weakness: Secondary | ICD-10-CM | POA: Diagnosis not present

## 2021-08-16 DIAGNOSIS — R5383 Other fatigue: Secondary | ICD-10-CM | POA: Diagnosis not present

## 2021-08-16 DIAGNOSIS — D508 Other iron deficiency anemias: Secondary | ICD-10-CM

## 2021-08-16 DIAGNOSIS — E669 Obesity, unspecified: Secondary | ICD-10-CM | POA: Diagnosis not present

## 2021-08-16 DIAGNOSIS — Z79899 Other long term (current) drug therapy: Secondary | ICD-10-CM | POA: Diagnosis not present

## 2021-08-16 DIAGNOSIS — K219 Gastro-esophageal reflux disease without esophagitis: Secondary | ICD-10-CM | POA: Diagnosis not present

## 2021-08-16 DIAGNOSIS — Z801 Family history of malignant neoplasm of trachea, bronchus and lung: Secondary | ICD-10-CM | POA: Diagnosis not present

## 2021-08-16 DIAGNOSIS — D573 Sickle-cell trait: Secondary | ICD-10-CM | POA: Diagnosis not present

## 2021-08-16 DIAGNOSIS — E781 Pure hyperglyceridemia: Secondary | ICD-10-CM | POA: Diagnosis not present

## 2021-08-16 MED ORDER — IRON SUCROSE 20 MG/ML IV SOLN
200.0000 mg | Freq: Once | INTRAVENOUS | Status: AC
  Administered 2021-08-16: 200 mg via INTRAVENOUS
  Filled 2021-08-16: qty 10

## 2021-08-16 MED ORDER — SODIUM CHLORIDE 0.9 % IV SOLN
Freq: Once | INTRAVENOUS | Status: AC
  Filled 2021-08-16: qty 250

## 2021-08-16 MED ORDER — SODIUM CHLORIDE 0.9 % IV SOLN: 200.0000 mg | Freq: Once | INTRAVENOUS | Status: DC

## 2021-08-16 NOTE — Patient Instructions (Signed)

## 2021-08-20 ENCOUNTER — Inpatient Hospital Stay: Payer: 59

## 2021-08-20 VITALS — BP 144/91 | HR 79 | Temp 96.7°F

## 2021-08-20 DIAGNOSIS — Z801 Family history of malignant neoplasm of trachea, bronchus and lung: Secondary | ICD-10-CM | POA: Diagnosis not present

## 2021-08-20 DIAGNOSIS — Z79899 Other long term (current) drug therapy: Secondary | ICD-10-CM | POA: Diagnosis not present

## 2021-08-20 DIAGNOSIS — E781 Pure hyperglyceridemia: Secondary | ICD-10-CM | POA: Diagnosis not present

## 2021-08-20 DIAGNOSIS — D509 Iron deficiency anemia, unspecified: Secondary | ICD-10-CM | POA: Diagnosis not present

## 2021-08-20 DIAGNOSIS — D573 Sickle-cell trait: Secondary | ICD-10-CM | POA: Diagnosis not present

## 2021-08-20 DIAGNOSIS — N920 Excessive and frequent menstruation with regular cycle: Secondary | ICD-10-CM | POA: Diagnosis not present

## 2021-08-20 DIAGNOSIS — K219 Gastro-esophageal reflux disease without esophagitis: Secondary | ICD-10-CM | POA: Diagnosis not present

## 2021-08-20 DIAGNOSIS — R5383 Other fatigue: Secondary | ICD-10-CM | POA: Diagnosis not present

## 2021-08-20 DIAGNOSIS — D508 Other iron deficiency anemias: Secondary | ICD-10-CM

## 2021-08-20 DIAGNOSIS — R531 Weakness: Secondary | ICD-10-CM | POA: Diagnosis not present

## 2021-08-20 DIAGNOSIS — E669 Obesity, unspecified: Secondary | ICD-10-CM | POA: Diagnosis not present

## 2021-08-20 MED ORDER — SODIUM CHLORIDE 0.9 % IV SOLN: 200.0000 mg | Freq: Once | INTRAVENOUS | Status: DC

## 2021-08-20 MED ORDER — SODIUM CHLORIDE 0.9 % IV SOLN
Freq: Once | INTRAVENOUS | Status: AC
  Filled 2021-08-20: qty 250

## 2021-08-20 MED ORDER — IRON SUCROSE 20 MG/ML IV SOLN
200.0000 mg | Freq: Once | INTRAVENOUS | Status: AC
  Administered 2021-08-20: 200 mg via INTRAVENOUS
  Filled 2021-08-20: qty 10

## 2021-08-20 NOTE — Patient Instructions (Signed)

## 2021-08-24 ENCOUNTER — Inpatient Hospital Stay: Payer: 59 | Attending: Nurse Practitioner

## 2021-08-24 VITALS — BP 143/91 | HR 89 | Temp 97.5°F | Resp 18

## 2021-08-24 DIAGNOSIS — Z79899 Other long term (current) drug therapy: Secondary | ICD-10-CM | POA: Insufficient documentation

## 2021-08-24 DIAGNOSIS — D509 Iron deficiency anemia, unspecified: Secondary | ICD-10-CM | POA: Insufficient documentation

## 2021-08-24 DIAGNOSIS — D508 Other iron deficiency anemias: Secondary | ICD-10-CM

## 2021-08-24 MED ORDER — SODIUM CHLORIDE 0.9 % IV SOLN
Freq: Once | INTRAVENOUS | Status: AC
  Filled 2021-08-24: qty 250

## 2021-08-24 MED ORDER — SODIUM CHLORIDE 0.9 % IV SOLN: 200.0000 mg | Freq: Once | INTRAVENOUS | Status: DC

## 2021-08-24 MED ORDER — IRON SUCROSE 20 MG/ML IV SOLN
200.0000 mg | Freq: Once | INTRAVENOUS | Status: AC
  Administered 2021-08-24: 200 mg via INTRAVENOUS
  Filled 2021-08-24: qty 10

## 2021-08-26 ENCOUNTER — Inpatient Hospital Stay: Payer: 59

## 2021-08-26 ENCOUNTER — Ambulatory Visit
Admission: RE | Admit: 2021-08-26 | Discharge: 2021-08-26 | Disposition: A | Discharge status: Home / Self Care | Payer: 59 | Source: Ambulatory Visit | Attending: Family Medicine | Admitting: Family Medicine

## 2021-08-26 ENCOUNTER — Ambulatory Visit
Admission: RE | Admit: 2021-08-26 | Discharge: 2021-08-26 | Disposition: A | Discharge status: Home / Self Care | Payer: 59 | Attending: Family Medicine | Admitting: Family Medicine

## 2021-08-26 ENCOUNTER — Encounter: Payer: Self-pay | Admitting: Family Medicine

## 2021-08-26 ENCOUNTER — Ambulatory Visit (INDEPENDENT_AMBULATORY_CARE_PROVIDER_SITE_OTHER): Payer: 59 | Admitting: Family Medicine

## 2021-08-26 VITALS — BP 140/88 | HR 110 | Ht 64.0 in | Wt 257.0 lb

## 2021-08-26 DIAGNOSIS — M5417 Radiculopathy, lumbosacral region: Secondary | ICD-10-CM

## 2021-08-26 DIAGNOSIS — Z23 Encounter for immunization: Secondary | ICD-10-CM | POA: Diagnosis not present

## 2021-08-26 DIAGNOSIS — Z0000 Encounter for general adult medical examination without abnormal findings: Secondary | ICD-10-CM | POA: Diagnosis not present

## 2021-08-26 DIAGNOSIS — R7989 Other specified abnormal findings of blood chemistry: Secondary | ICD-10-CM | POA: Diagnosis not present

## 2021-08-26 DIAGNOSIS — M545 Low back pain, unspecified: Secondary | ICD-10-CM | POA: Diagnosis not present

## 2021-08-26 DIAGNOSIS — K219 Gastro-esophageal reflux disease without esophagitis: Secondary | ICD-10-CM | POA: Diagnosis not present

## 2021-08-26 DIAGNOSIS — Z13220 Encounter for screening for lipoid disorders: Secondary | ICD-10-CM | POA: Diagnosis not present

## 2021-08-26 MED ORDER — MELOXICAM 15 MG PO TABS: 15.0000 mg | ORAL_TABLET | Freq: Every day | ORAL | 0 refills | Status: DC

## 2021-08-26 MED ORDER — GABAPENTIN 300 MG PO CAPS
300.0000 mg | ORAL_CAPSULE | Freq: Every day | ORAL | 0 refills | Status: DC
Start: 2021-08-26 — End: 2021-11-09

## 2021-08-26 NOTE — Assessment & Plan Note (Signed)
Chronic condition, stable, uses Nexium on a as needed basis, no worsening symptoms since initiating meloxicam. ?

## 2021-08-26 NOTE — Progress Notes (Signed)
?  ? ?Annual Physical Exam Visit ? ?Patient Information:  ?Patient ID: Eileen Vargas, female DOB: 08-30-1990 Age: 31 y.o. MRN: [id number]  ? ?Subjective:  ? ?CC: Annual Physical Exam ? ?HPI:  ?Eileen Vargas is here for their annual physical. ? ?I reviewed the past medical history, family history, social history, surgical history, and allergies today and changes were made as necessary.  Please see the problem list section below for additional details. ? ?Past Medical History: ?History reviewed. No pertinent past medical history. ?Past Surgical History: ?Past Surgical History:  ?Procedure Laterality Date  ? CHOLECYSTECTOMY  2013  ? ?Family History: ?Family History  ?Problem Relation Age of Onset  ? Diabetes Maternal Grandmother   ? Lung cancer Maternal Grandfather   ? Kidney disease Mother   ? Clotting disorder Father   ? Sickle cell trait Father   ? ?Allergies: ?No Known Allergies ?Health Maintenance: ?Health Maintenance  ?Topic Date Due  ? COVID-19 Vaccine (4 - Booster for Pfizer series) 08/12/2020  ? INFLUENZA VACCINE  11/23/2021  ? PAP SMEAR-Modifier  07/24/2023  ? TETANUS/TDAP  08/27/2031  ? Hepatitis C Screening  Completed  ? HIV Screening  Completed  ? HPV VACCINES  Aged Out  ?  ?HM Colonoscopy   ? ? This patient has no relevant Health Maintenance data.  ? ?  ? ?Medications: ?Current Outpatient Medications on File Prior to Visit  ?Medication Sig Dispense Refill  ? citalopram (CELEXA) 20 MG tablet TAKE 1 TABLET(20 MG) BY MOUTH DAILY 30 tablet 2  ? esomeprazole (NEXIUM) 40 MG capsule Take 1 capsule (40 mg total) by mouth daily as needed. One tab by mouth at dinner time. 90 capsule 0  ? ferrous sulfate 325 (65 FE) MG tablet Take 325 mg by mouth daily with breakfast.    ? Iron Sucrose (VENOFER IV) Inject 200 mg into the vein every 7 (seven) days.    ? ?No current facility-administered medications on file prior to visit.  ? ? ?Review of Systems: No headache, visual changes, nausea, vomiting, diarrhea, constipation,  dizziness, abdominal pain, skin rash, fevers, chills, night sweats, swollen lymph nodes, weight loss, chest pain, body aches, joint swelling, muscle aches, shortness of breath, mood changes, visual or auditory hallucinations reported. ? ?Objective:  ? ?Vitals:  ? 08/26/21 1530  ?BP: 140/88  ?Pulse: (!) 110  ?SpO2: 96%  ? ?Vitals:  ? 08/26/21 1530  ?Weight: 257 lb (116.6 kg)  ?Height: '5\' 4"'$  (1.626 m)  ? ?Body mass index is 44.11 kg/m?. ? ?General: Well Developed, well nourished, and in no acute distress.  ?Neuro: Alert and oriented x3, extra-ocular muscles intact, sensation grossly intact. Cranial nerves II through XII are grossly intact, motor, sensory, and coordinative functions are intact. ?HEENT: Normocephalic, atraumatic, pupils equal round reactive to light, neck supple, no masses, no lymphadenopathy, thyroid nonpalpable. Oropharynx, nasopharynx, external ear canals are unremarkable. ?Skin: Warm and dry, no rashes noted.  ?Cardiac: Regular rate and rhythm, no murmurs rubs or gallops. No peripheral edema. Pulses symmetric. ?Respiratory: Clear to auscultation bilaterally. Not using accessory muscles, speaking in full sentences.  ?Abdominal: Soft, nontender, nondistended, positive bowel sounds, no masses, no organomegaly. ?Musculoskeletal: Shoulder, elbow, wrist, hip, knee, ankle stable, and with full range of motion. Paraspinal lumbar tenderness, positive SLR right. ? ?Female chaperone initials: KP present throughout the physical examination. ? ?Impression and Recommendations:  ? ?The patient was counselled, risk factors were discussed, and anticipatory guidance given. ? ?Problem List Items Addressed This Visit   ? ?  ?  Digestive  ? Gastroesophageal reflux disease without esophagitis  ?  Chronic condition, stable, uses Nexium on a as needed basis, no worsening symptoms since initiating meloxicam. ? ?  ?  ?  ? Nervous and Auditory  ? Right lumbosacral radiculopathy  ?  Patient returns for follow-up to recently  noted right lumbosacral radiculopathy, at her last virtual visit on 08/12/2021 concern was for intervertebral involvement affecting the right L5-S1 nerve distribution.  She was started on meloxicam and advised follow-up.  She states that she has noted resolution of the low back pain but has had a persistent right leg paresthesias intermittently, has been compliant with home exercises/stretches. ? ?Given her stated history and findings, I have advised initiation of gabapentin nightly, continue daily meloxicam, lumbar spine x-rays, and we will coordinate follow-up pending results. ? ?  ?  ? Relevant Medications  ? meloxicam (MOBIC) 15 MG tablet  ? gabapentin (NEURONTIN) 300 MG capsule  ?  ? Other  ? Annual physical exam - Primary  ?  Annual examination completed, risk stratification labs ordered, anticipatory guidance provided.  We will follow labs once resulted.  Received Tdap today. ? ?  ?  ? Relevant Orders  ? Apo A1 + B + Ratio  ? CBC  ? Comprehensive metabolic panel  ? Lipid panel  ? TSH  ? VITAMIN D 25 Hydroxy (Vit-D Deficiency, Fractures)  ? ?Other Visit Diagnoses   ? ? Screening for lipoid disorders      ? Relevant Orders  ? Apo A1 + B + Ratio  ? Lipid panel  ? Low serum vitamin D      ? Relevant Orders  ? VITAMIN D 25 Hydroxy (Vit-D Deficiency, Fractures)  ? Need for Tdap vaccination      ? Relevant Orders  ? Tdap vaccine greater than or equal to 7yo IM (Completed)  ? ?  ?  ? ?Orders & Medications ?Medications:  ?Meds ordered this encounter  ?Medications  ? meloxicam (MOBIC) 15 MG tablet  ?  Sig: Take 1 tablet (15 mg total) by mouth daily.  ?  Dispense:  30 tablet  ?  Refill:  0  ? gabapentin (NEURONTIN) 300 MG capsule  ?  Sig: Take 1 capsule (300 mg total) by mouth at bedtime.  ?  Dispense:  30 capsule  ?  Refill:  0  ? ?Orders Placed This Encounter  ?Procedures  ? Tdap vaccine greater than or equal to 7yo IM  ? Apo A1 + B + Ratio  ? CBC  ? Comprehensive metabolic panel  ? Lipid panel  ? TSH  ? VITAMIN D 25  Hydroxy (Vit-D Deficiency, Fractures)  ?  ? ?Return in about 1 year (around 08/27/2022) for Annual Physical.  ? ? ?Montel Culver, MD ? ? Primary Care Sports Medicine ?[facility] ?[place]  ? ?

## 2021-08-26 NOTE — Assessment & Plan Note (Signed)
Annual examination completed, risk stratification labs ordered, anticipatory guidance provided.  We will follow labs once resulted.  Received Tdap today. ?

## 2021-08-26 NOTE — Patient Instructions (Addendum)
-   Obtain fasting labs with orders provided (can have water or black coffee but otherwise no food or drink x 8 hours before labs) ?- Obtain x-rays today ?- Continue meloxicam daily, start nightly gabapentin 100 mg ?- Review information provided ?- Attend eye doctor annually, dentist every 6 months, work towards or maintain 30 minutes of moderate intensity physical activity at least 5 days per week, and consume a balanced diet ?- Return in 1 year for physical ?- Contact us for any questions between now and then ?

## 2021-08-26 NOTE — Assessment & Plan Note (Signed)
Patient returns for follow-up to recently noted right lumbosacral radiculopathy, at her last virtual visit on 08/12/2021 concern was for intervertebral involvement affecting the right L5-S1 nerve distribution.  She was started on meloxicam and advised follow-up.  She states that she has noted resolution of the low back pain but has had a persistent right leg paresthesias intermittently, has been compliant with home exercises/stretches. ? ?Given her stated history and findings, I have advised initiation of gabapentin nightly, continue daily meloxicam, lumbar spine x-rays, and we will coordinate follow-up pending results. ?

## 2021-08-27 DIAGNOSIS — Z7689 Persons encountering health services in other specified circumstances: Secondary | ICD-10-CM | POA: Diagnosis not present

## 2021-08-27 DIAGNOSIS — R7309 Other abnormal glucose: Secondary | ICD-10-CM | POA: Diagnosis not present

## 2021-08-28 LAB — COMPREHENSIVE METABOLIC PANEL
ALT: 24 IU/L (ref 0–32)
AST: 19 IU/L (ref 0–40)
Albumin/Globulin Ratio: 1.5 (ref 1.2–2.2)
Albumin: 4.4 g/dL (ref 3.8–4.8)
Alkaline Phosphatase: 79 IU/L (ref 44–121)
BUN/Creatinine Ratio: 16 (ref 9–23)
BUN: 13 mg/dL (ref 6–20)
Bilirubin Total: <0.2 mg/dL (ref 0.0–1.2)
CO2: 20 mmol/L (ref 20–29)
Calcium: 8.8 mg/dL (ref 8.7–10.2)
Chloride: 102 mmol/L (ref 96–106)
Creatinine, Ser: 0.81 mg/dL (ref 0.57–1.00)
Globulin, Total: 3 g/dL (ref 1.5–4.5)
Glucose: 109 mg/dL — ABNORMAL HIGH (ref 70–99)
Potassium: 4.3 mmol/L (ref 3.5–5.2)
Sodium: 137 mmol/L (ref 134–144)
Total Protein: 7.4 g/dL (ref 6.0–8.5)
eGFR: 99 mL/min/{1.73_m2} (ref >59)

## 2021-08-28 LAB — TSH: TSH: 2.5 u[IU]/mL (ref 0.450–4.500)

## 2021-08-28 LAB — LIPID PANEL
Chol/HDL Ratio: 4.2 ratio (ref 0.0–4.4)
Cholesterol, Total: 152 mg/dL (ref 100–199)
HDL: 36 mg/dL — ABNORMAL LOW (ref >39)
LDL Chol Calc (NIH): 88 mg/dL (ref 0–99)
Triglycerides: 163 mg/dL — ABNORMAL HIGH (ref 0–149)
VLDL Cholesterol Cal: 28 mg/dL (ref 5–40)

## 2021-08-28 LAB — APO A1 + B + RATIO
Apolipo. B/A-1 Ratio: 0.6 ratio (ref 0.0–0.6)
Apolipoprotein A-1: 124 mg/dL (ref 116–209)
Apolipoprotein B: 79 mg/dL (ref <90)

## 2021-08-28 LAB — CBC
Hematocrit: 34.9 % (ref 34.0–46.6)
Hemoglobin: 10.8 g/dL — ABNORMAL LOW (ref 11.1–15.9)
MCH: 23.1 pg — ABNORMAL LOW (ref 26.6–33.0)
MCHC: 30.9 g/dL — ABNORMAL LOW (ref 31.5–35.7)
MCV: 75 fL — ABNORMAL LOW (ref 79–97)
Platelets: 318 10*3/uL (ref 150–450)
RBC: 4.68 x10E6/uL (ref 3.77–5.28)
RDW: 23.3 % — ABNORMAL HIGH (ref 11.7–15.4)
WBC: 5.5 10*3/uL (ref 3.4–10.8)

## 2021-08-28 LAB — VITAMIN D 25 HYDROXY (VIT D DEFICIENCY, FRACTURES): Vit D, 25-Hydroxy: 8.1 ng/mL — ABNORMAL LOW (ref 30.0–100.0)

## 2021-08-30 ENCOUNTER — Other Ambulatory Visit: Payer: Self-pay | Admitting: Family Medicine

## 2021-08-30 DIAGNOSIS — R7989 Other specified abnormal findings of blood chemistry: Secondary | ICD-10-CM

## 2021-08-30 MED ORDER — VITAMIN D (ERGOCALCIFEROL) 1.25 MG (50000 UNIT) PO CAPS: 50000.0000 [IU] | ORAL_CAPSULE | ORAL | 0 refills | Status: DC

## 2021-08-31 LAB — HGB A1C W/O EAG: Hgb A1c MFr Bld: 5.5 % (ref 4.8–5.6)

## 2021-08-31 LAB — SPECIMEN STATUS REPORT

## 2021-09-07 ENCOUNTER — Other Ambulatory Visit: Payer: Self-pay | Admitting: Family Medicine

## 2021-09-07 DIAGNOSIS — K219 Gastro-esophageal reflux disease without esophagitis: Secondary | ICD-10-CM

## 2021-09-08 NOTE — Telephone Encounter (Signed)
Requested medication (s) are due for refill today - no ? ?Requested medication (s) are on the active medication list -no ? ?Future visit scheduled -yes ? ?Last refill: unsure- not on current list ? ?Notes to clinic: Pharmacy is requesting changes to current Rx- sent for PCP review ? ?Requested Prescriptions  ?Pending Prescriptions Disp Refills  ? omeprazole (PRILOSEC) 40 MG capsule [Pharmacy Med Name: OMEPRAZOLE DR 40 MG CAPSULE]  0  ?  ? Gastroenterology: Proton Pump Inhibitors Passed - 09/07/2021  8:40 AM  ?  ?  Passed - Valid encounter within last 12 months  ?  Recent Outpatient Visits   ? ?      ? 1 week ago Annual physical exam  ? [facility] Montel Culver, MD  ? 3 weeks ago Right lumbosacral radiculopathy  ? [facility] Montel Culver, MD  ? 7 months ago Gastroesophageal reflux disease without esophagitis  ? [facility] Montel Culver, MD  ? 1 year ago Gastroesophageal reflux disease without esophagitis  ? [facility] Montel Culver, MD  ? 1 year ago Routine physical examination  ? [facility] Montel Culver, MD  ? ?  ?  ?Future Appointments   ? ?        ? In 11 months Zigmund Daniel Earley Abide, MD [facility] [place]  ? ?  ? ? ?  ?  ?  ? ? ? ?Requested Prescriptions  ?Pending Prescriptions Disp Refills  ? omeprazole (PRILOSEC) 40 MG capsule [Pharmacy Med Name: OMEPRAZOLE DR 40 MG CAPSULE]  0  ?  ? Gastroenterology: Proton Pump Inhibitors Passed - 09/07/2021  8:40 AM  ?  ?  Passed - Valid encounter within last 12 months  ?  Recent Outpatient Visits   ? ?      ? 1 week ago Annual physical exam  ? [facility] Montel Culver, MD  ? 3 weeks ago Right lumbosacral radiculopathy  ? [facility] Montel Culver, MD  ? 7 months ago Gastroesophageal reflux disease without esophagitis  ? [facility] Montel Culver, MD  ? 1 year ago Gastroesophageal reflux disease without esophagitis  ? [facility] Montel Culver, MD  ? 1 year ago Routine physical examination  ? [facility] Montel Culver, MD  ? ?  ?  ?Future Appointments   ? ?        ? In 11 months Zigmund Daniel Earley Abide, MD [facility], [place]  ? ?  ? ? ?  ?  ?  ? ? ? ?

## 2021-09-08 NOTE — Telephone Encounter (Signed)
Please review.  KP

## 2021-09-10 ENCOUNTER — Other Ambulatory Visit: Payer: Self-pay | Admitting: Family Medicine

## 2021-09-10 DIAGNOSIS — K219 Gastro-esophageal reflux disease without esophagitis: Secondary | ICD-10-CM

## 2021-09-22 ENCOUNTER — Other Ambulatory Visit: Payer: Self-pay | Admitting: Family Medicine

## 2021-09-22 DIAGNOSIS — M5417 Radiculopathy, lumbosacral region: Secondary | ICD-10-CM

## 2021-09-22 NOTE — Telephone Encounter (Signed)
Requested Prescriptions  Pending Prescriptions Disp Refills  . meloxicam (MOBIC) 15 MG tablet [Pharmacy Med Name: MELOXICAM 15 MG TABLET] 30 tablet 0    Sig: TAKE 1 TABLET (15 MG TOTAL) BY MOUTH DAILY.     Analgesics:  COX2 Inhibitors Failed - 09/22/2021  1:39 AM      Failed - Manual Review: Labs are only required if the patient has taken medication for more than 8 weeks.      Failed - HGB in normal range and within 360 days    Hemoglobin  Date Value Ref Range Status  08/27/2021 10.8 (L) 11.1 - 15.9 g/dL Final         Passed - Cr in normal range and within 360 days    Creatinine, Ser  Date Value Ref Range Status  08/27/2021 0.81 0.57 - 1.00 mg/dL Final         Passed - HCT in normal range and within 360 days    Hematocrit  Date Value Ref Range Status  08/27/2021 34.9 34.0 - 46.6 % Final         Passed - AST in normal range and within 360 days    AST  Date Value Ref Range Status  08/27/2021 19 0 - 40 IU/L Final         Passed - ALT in normal range and within 360 days    ALT  Date Value Ref Range Status  08/27/2021 24 0 - 32 IU/L Final         Passed - eGFR is 30 or above and within 360 days    GFR, Estimated  Date Value Ref Range Status  05/29/2020 >60 >60 mL/min Final    Comment:    (NOTE) Calculated using the CKD-EPI Creatinine Equation (2021)    eGFR  Date Value Ref Range Status  08/27/2021 99 >59 mL/min/1.73 Final         Passed - Patient is not pregnant      Passed - Valid encounter within last 12 months    Recent Outpatient Visits          3 weeks ago Annual physical exam   [facility] Matthews, Jason J, MD   1 month ago Right lumbosacral radiculopathy   [facility] Matthews, Jason J, MD   7 months ago Gastroesophageal reflux disease without esophagitis   [facility] Matthews, Jason J, MD   1 year ago Gastroesophageal reflux disease without esophagitis   [facility] Matthews, Jason J, MD   1 year ago Routine  physical examination   [facility] Matthews, Jason J, MD      Future Appointments            In 11 months Matthews, Jason J, MD [facility], [facility]             

## 2021-10-06 ENCOUNTER — Other Ambulatory Visit: Payer: Self-pay | Admitting: Family Medicine

## 2021-10-06 DIAGNOSIS — F32A Depression, unspecified: Secondary | ICD-10-CM

## 2021-10-06 NOTE — Telephone Encounter (Signed)
Requested Prescriptions  Pending Prescriptions Disp Refills  . citalopram (CELEXA) 20 MG tablet [Pharmacy Med Name: CITALOPRAM HBR 20 MG TABLET] 30 tablet 2    Sig: TAKE 1 TABLET BY MOUTH DAILY     Psychiatry:  Antidepressants - SSRI Passed - 10/06/2021  1:57 AM      Passed - Completed PHQ-2 or PHQ-9 in the last 360 days      Passed - Valid encounter within last 6 months    Recent Outpatient Visits          1 month ago Annual physical exam   [facility] Montel Culver, MD   1 month ago Right lumbosacral radiculopathy   [facility] Montel Culver, MD   8 months ago Gastroesophageal reflux disease without esophagitis   [facility] Montel Culver, MD   1 year ago Gastroesophageal reflux disease without esophagitis   [facility] Montel Culver, MD   1 year ago Routine physical examination   [facility] Montel Culver, MD      Future Appointments            In 10 months Zigmund Daniel Earley Abide, MD [facility], [facility]

## 2021-11-05 ENCOUNTER — Other Ambulatory Visit: Payer: Self-pay | Admitting: Family Medicine

## 2021-11-05 DIAGNOSIS — K219 Gastro-esophageal reflux disease without esophagitis: Secondary | ICD-10-CM

## 2021-11-05 NOTE — Telephone Encounter (Signed)
Requested Prescriptions  Pending Prescriptions Disp Refills  . esomeprazole (NEXIUM) 40 MG capsule [Pharmacy Med Name: ESOMEPRAZOLE MAG DR 40 MG CAP] 90 capsule 2    Sig: TAKE 1 CAPSULE (40 MG TOTAL) BY MOUTH DAILY AS NEEDED. ONE TAB BY MOUTH AT [facility] TIME.     Gastroenterology: Proton Pump Inhibitors 2 Passed - 11/05/2021  2:26 AM      Passed - ALT in normal range and within 360 days    ALT  Date Value Ref Range Status  08/27/2021 24 0 - 32 IU/L Final         Passed - AST in normal range and within 360 days    AST  Date Value Ref Range Status  08/27/2021 19 0 - 40 IU/L Final         Passed - Valid encounter within last 12 months    Recent Outpatient Visits          2 months ago Annual physical exam   [facility] Montel Culver, MD   2 months ago Right lumbosacral radiculopathy   [facility] Montel Culver, MD   9 months ago Gastroesophageal reflux disease without esophagitis   [facility] Montel Culver, MD   1 year ago Gastroesophageal reflux disease without esophagitis   [facility] Montel Culver, MD   1 year ago Routine physical examination   [facility] Montel Culver, MD      Future Appointments            In 9 months Zigmund Daniel, Earley Abide, MD [facility], [facility]

## 2021-11-08 ENCOUNTER — Other Ambulatory Visit: Payer: Self-pay

## 2021-11-08 ENCOUNTER — Inpatient Hospital Stay: Payer: 59 | Attending: Nurse Practitioner

## 2021-11-08 DIAGNOSIS — N920 Excessive and frequent menstruation with regular cycle: Secondary | ICD-10-CM | POA: Diagnosis not present

## 2021-11-08 DIAGNOSIS — D573 Sickle-cell trait: Secondary | ICD-10-CM | POA: Diagnosis not present

## 2021-11-08 DIAGNOSIS — D509 Iron deficiency anemia, unspecified: Secondary | ICD-10-CM | POA: Insufficient documentation

## 2021-11-08 DIAGNOSIS — D508 Other iron deficiency anemias: Secondary | ICD-10-CM

## 2021-11-08 LAB — CBC WITH DIFFERENTIAL/PLATELET
Abs Immature Granulocytes: 0.01 10*3/uL (ref 0.00–0.07)
Basophils Absolute: 0 10*3/uL (ref 0.0–0.1)
Basophils Relative: 1 %
Eosinophils Absolute: 0 10*3/uL (ref 0.0–0.5)
Eosinophils Relative: 1 %
HCT: 35.6 % — ABNORMAL LOW (ref 36.0–46.0)
Hemoglobin: 11.1 g/dL — ABNORMAL LOW (ref 12.0–15.0)
Immature Granulocytes: 0 %
Lymphocytes Relative: 41 %
Lymphs Abs: 2.2 10*3/uL (ref 0.7–4.0)
MCH: 24.9 pg — ABNORMAL LOW (ref 26.0–34.0)
MCHC: 31.2 g/dL (ref 30.0–36.0)
MCV: 80 fL (ref 80.0–100.0)
Monocytes Absolute: 0.3 10*3/uL (ref 0.1–1.0)
Monocytes Relative: 6 %
Neutro Abs: 2.8 10*3/uL (ref 1.7–7.7)
Neutrophils Relative %: 51 %
Platelets: 335 10*3/uL (ref 150–400)
RBC: 4.45 MIL/uL (ref 3.87–5.11)
RDW: 17.6 % — ABNORMAL HIGH (ref 11.5–15.5)
WBC: 5.4 10*3/uL (ref 4.0–10.5)
nRBC: 0 % (ref 0.0–0.2)

## 2021-11-08 LAB — IRON AND TIBC
Iron: 30 ug/dL (ref 28–170)
Saturation Ratios: 7 % — ABNORMAL LOW (ref 10.4–31.8)
TIBC: 403 ug/dL (ref 250–450)
UIBC: 373 ug/dL

## 2021-11-08 LAB — FERRITIN: Ferritin: 18 ng/mL (ref 11–307)

## 2021-11-09 ENCOUNTER — Inpatient Hospital Stay (HOSPITAL_BASED_OUTPATIENT_CLINIC_OR_DEPARTMENT_OTHER): Payer: 59 | Admitting: Medical Oncology

## 2021-11-09 ENCOUNTER — Ambulatory Visit: Payer: 59 | Admitting: Oncology

## 2021-11-09 ENCOUNTER — Inpatient Hospital Stay: Payer: 59

## 2021-11-09 VITALS — BP 128/85 | HR 71 | Temp 97.8°F | Resp 16 | Wt 253.0 lb

## 2021-11-09 VITALS — BP 122/78 | HR 69

## 2021-11-09 DIAGNOSIS — D509 Iron deficiency anemia, unspecified: Secondary | ICD-10-CM

## 2021-11-09 DIAGNOSIS — N920 Excessive and frequent menstruation with regular cycle: Secondary | ICD-10-CM | POA: Diagnosis not present

## 2021-11-09 DIAGNOSIS — D259 Leiomyoma of uterus, unspecified: Secondary | ICD-10-CM

## 2021-11-09 DIAGNOSIS — D573 Sickle-cell trait: Secondary | ICD-10-CM | POA: Diagnosis not present

## 2021-11-09 DIAGNOSIS — D508 Other iron deficiency anemias: Secondary | ICD-10-CM

## 2021-11-09 MED ORDER — SODIUM CHLORIDE 0.9 % IV SOLN
200.0000 mg | Freq: Once | INTRAVENOUS | Status: AC
  Administered 2021-11-09: 200 mg via INTRAVENOUS
  Filled 2021-11-09: qty 200

## 2021-11-09 MED ORDER — SODIUM CHLORIDE 0.9 % IV SOLN
Freq: Once | INTRAVENOUS | Status: AC
  Filled 2021-11-09: qty 250

## 2021-11-09 NOTE — Progress Notes (Signed)
Returns for follow-up and possible iron infusion today. Endorses fatigue and shortness of breath, but reports that her symptoms are at baseline and not any worse.

## 2021-11-09 NOTE — Progress Notes (Signed)
[facility]  [phone] [phone]  ID: Eileen Vargas OB: [birth year]  MR#: [id number]  BTD#:[id number]  Patient Care Team: Montel Culver, MD as PCP - General (Family Medicine)  CHIEF COMPLAINT: Iron deficiency anemia.  INTERVAL HISTORY: Patient returns to clinic today for discussion of lab results, further evaluation, and consideration of additional IV venofer. She has tolerated this well in the past but states that she is frustrated that she cannot keep her iron values up. Usually takes iron daily in the morning with oatmeal and OJ however she reports that she has not been able to find oral iron for the past 2 weeks at stores. Oral iron gives her some nausea. She continues to have heavy bleeding secondary to known fibroids. She has not followed up with gynecology for management. She reports baseline SOB and fatigue. She has no neurologic complaints.  She denies any recent fevers or illnesses.  She has a good appetite and denies weight loss.  She denies any weakness or fatigue. She has no chest pain, shortness of breath, cough, or hemoptysis.  She denies any nausea, vomiting, constipation, or diarrhea.  She has no melena or hematochezia.  She has no urinary complaints.  Patient feels at her baseline offers no specific complaints today.  REVIEW OF SYSTEMS:   Review of Systems  Constitutional:  Positive for malaise/fatigue. Negative for chills, fever and weight loss.  HENT:  Negative for congestion, ear discharge, ear pain, sinus pain, sore throat and tinnitus.   Eyes: Negative.   Respiratory:  Positive for shortness of breath. Negative for cough and sputum production.   Cardiovascular:  Negative for chest pain, palpitations, orthopnea, claudication and leg swelling.  Gastrointestinal:  Negative for abdominal pain, blood in stool, constipation, diarrhea, heartburn, nausea and vomiting.  Genitourinary: Negative.  Negative for frequency and hematuria.   Musculoskeletal: Negative.  Negative for back pain, joint pain, myalgias and neck pain.  Skin: Negative.   Neurological:  Negative for dizziness, tingling, weakness and headaches.  Endo/Heme/Allergies: Negative.   Psychiatric/Behavioral: Negative.  Negative for depression. The patient is not nervous/anxious.    As per HPI. Otherwise, a complete review of systems is negative.  PAST MEDICAL HISTORY: No past medical history on file.  Patient Active Problem List   Diagnosis Date Noted   Annual physical exam 08/26/2021   Right lumbosacral radiculopathy 08/12/2021   Fibroid, uterine 08/10/2021   Hypertriglyceridemia 02/08/2021   Anxiety and depression 07/13/2020   Gastroesophageal reflux disease without esophagitis 07/13/2020   Fibroid 07/13/2020   Class 3 severe obesity due to excess calories with body mass index (BMI) of 45.0 to 49.9 in adult ([place]) 07/13/2020   Sickle cell trait ([place]) 07/01/2020   Iron deficiency anemia 06/06/2020   PAST SURGICAL HISTORY: Past Surgical History:  Procedure Laterality Date   CHOLECYSTECTOMY  2013    FAMILY HISTORY: Family History  Problem Relation Age of Onset   Diabetes Maternal Grandmother    Lung cancer Maternal Grandfather    Kidney disease Mother    Clotting disorder Father    Sickle cell trait Father     ADVANCED DIRECTIVES (Y/N):  N  HEALTH MAINTENANCE: Social History   Tobacco Use   Smoking status: Never   Smokeless tobacco: Never  Vaping Use   Vaping Use: Never used  Substance Use Topics   Alcohol use: Yes    Alcohol/week: 2.0 standard drinks of alcohol    Types: 1 Glasses of wine, 1 Shots of liquor per week  Drug use: Never    Colonoscopy:  PAP:  Bone density:  Lipid panel:  No Known Allergies  Current Outpatient Medications  Medication Sig Dispense Refill   citalopram (CELEXA) 20 MG tablet TAKE 1 TABLET BY MOUTH DAILY 30 tablet 2   esomeprazole (NEXIUM) 40 MG capsule TAKE 1 CAPSULE (40 MG TOTAL) BY MOUTH DAILY  AS NEEDED. ONE TAB BY MOUTH AT [facility] TIME. 90 capsule 2   ferrous sulfate 325 (65 FE) MG tablet Take 325 mg by mouth daily with breakfast. (Patient not taking: Reported on 11/09/2021)     Iron Sucrose (VENOFER IV) Inject 200 mg into the vein every 7 (seven) days.     No current facility-administered medications for this visit.    OBJECTIVE: Vitals:   11/09/21 1314 11/09/21 1349  BP: (!) 155/97 128/85  Pulse: 78 71  Resp: 16   Temp: 97.8 F (36.6 C)   SpO2: 100%      Body mass index is 43.43 kg/m.    ECOG FS:1 - Symptomatic but completely ambulatory  General: Well-developed, well-nourished, no acute distress. Eyes: Pallor of conjunctiva, anicteric sclera. HEENT: Normocephalic, moist mucous membranes. Lungs: No audible wheezing or coughing. Heart: Regular rate and rhythm. Abdomen: Soft, nontender, no obvious distention. Musculoskeletal: No edema, cyanosis, or clubbing. Neuro: Alert, answering all questions appropriately. Cranial nerves grossly intact. Skin: No rashes or petechiae noted. Psych: Normal affect.   LAB RESULTS:  Lab Results  Component Value Date   NA 137 08/27/2021   K 4.3 08/27/2021   CL 102 08/27/2021   CO2 20 08/27/2021   GLUCOSE 109 (H) 08/27/2021   BUN 13 08/27/2021   CREATININE 0.81 08/27/2021   CALCIUM 8.8 08/27/2021   PROT 7.4 08/27/2021   ALBUMIN 4.4 08/27/2021   AST 19 08/27/2021   ALT 24 08/27/2021   ALKPHOS 79 08/27/2021   BILITOT <0.2 08/27/2021   GFRNONAA >60 05/29/2020    Lab Results  Component Value Date   WBC 5.4 11/08/2021   NEUTROABS 2.8 11/08/2021   HGB 11.1 (L) 11/08/2021   HCT 35.6 (L) 11/08/2021   MCV 80.0 11/08/2021   PLT 335 11/08/2021   Lab Results  Component Value Date   IRON 30 11/08/2021   TIBC 403 11/08/2021   IRONPCTSAT 7 (L) 11/08/2021   Lab Results  Component Value Date   FERRITIN 18 11/08/2021    STUDIES: No results found.  ASSESSMENT: Iron deficiency anemia.  PLAN:    1.  Iron deficiency  anemia: Patient's hemoglobin continues to be lower than target despite oral iron. Oral iron also causing nausea. Venofer helps some but iron stores do not seem to remain. She reports frustration and a bit of being overwhelmed by this. IDA secondary to blood loss in context of uterine fibroids. We discussed the need for OB-GYN follow up. We discussed different ways to help reduce bleeding: D&C, IUD, Nexplanon, Depo, OCP, etc. Recommend IV iron today but also discussed trying to switch her from Venofer to Monoferric to help reduce medical burden.  She will receive one dose of Venofer today. Switching to Monoferric. Return to clinic in 3 months with repeat laboratory work and further evaluation.   2.  Sickle cell trait: No intervention needed.  She does not have any children at this time.  Likely the etiology of her mild baseline anemia.  3. Heavy menses- hx of fibroids. Previously saw Dr. Georgianne Fick and discussed options for management in March 2022. Overdue for follow up. Encouraged especially given extent of  her IDA.   Disposition: Venofer today- weekly for a total of 5 doses  RTC in 2-3 months for labs (cbc, ferritin, iron studies)- plan to switch to Monoferric after if covered by insurance  Day later see Dr. Grayland Ormond, +/- MOnoferric [place]  I spent a total of 30 minutes reviewing chart data, face-to-face evaluation with the patient, counseling and coordination of care as detailed above.  Patient expressed understanding and was in agreement with this plan. She also understands that She can call clinic at any time with any questions, concerns, or complaints.   Hughie Closs, PA-C   11/09/2021

## 2021-11-09 NOTE — Patient Instructions (Signed)

## 2021-11-16 ENCOUNTER — Inpatient Hospital Stay: Payer: 59

## 2021-11-16 VITALS — BP 126/78 | HR 81 | Temp 98.4°F | Resp 18

## 2021-11-16 DIAGNOSIS — N920 Excessive and frequent menstruation with regular cycle: Secondary | ICD-10-CM | POA: Diagnosis not present

## 2021-11-16 DIAGNOSIS — D573 Sickle-cell trait: Secondary | ICD-10-CM | POA: Diagnosis not present

## 2021-11-16 DIAGNOSIS — D508 Other iron deficiency anemias: Secondary | ICD-10-CM

## 2021-11-16 DIAGNOSIS — D509 Iron deficiency anemia, unspecified: Secondary | ICD-10-CM | POA: Diagnosis not present

## 2021-11-16 MED ORDER — SODIUM CHLORIDE 0.9 % IV SOLN
200.0000 mg | Freq: Once | INTRAVENOUS | Status: AC
  Administered 2021-11-16: 200 mg via INTRAVENOUS
  Filled 2021-11-16: qty 200

## 2021-11-16 MED ORDER — SODIUM CHLORIDE 0.9 % IV SOLN
Freq: Once | INTRAVENOUS | Status: AC
  Filled 2021-11-16: qty 250

## 2021-11-16 NOTE — Patient Instructions (Signed)

## 2021-11-23 ENCOUNTER — Inpatient Hospital Stay: Payer: 59 | Attending: Nurse Practitioner

## 2021-11-23 VITALS — BP 124/79 | HR 75 | Temp 99.7°F | Resp 19

## 2021-11-23 DIAGNOSIS — Z79899 Other long term (current) drug therapy: Secondary | ICD-10-CM | POA: Diagnosis not present

## 2021-11-23 DIAGNOSIS — D509 Iron deficiency anemia, unspecified: Secondary | ICD-10-CM | POA: Insufficient documentation

## 2021-11-23 DIAGNOSIS — D508 Other iron deficiency anemias: Secondary | ICD-10-CM

## 2021-11-23 MED ORDER — SODIUM CHLORIDE 0.9 % IV SOLN
200.0000 mg | Freq: Once | INTRAVENOUS | Status: AC
  Administered 2021-11-23: 200 mg via INTRAVENOUS
  Filled 2021-11-23: qty 200

## 2021-11-23 MED ORDER — SODIUM CHLORIDE 0.9 % IV SOLN
Freq: Once | INTRAVENOUS | Status: AC
  Filled 2021-11-23: qty 250

## 2021-11-23 NOTE — Patient Instructions (Signed)

## 2021-11-30 ENCOUNTER — Inpatient Hospital Stay: Payer: 59

## 2021-11-30 VITALS — BP 135/84 | HR 70 | Temp 98.1°F | Resp 18

## 2021-11-30 DIAGNOSIS — D509 Iron deficiency anemia, unspecified: Secondary | ICD-10-CM | POA: Diagnosis not present

## 2021-11-30 DIAGNOSIS — Z79899 Other long term (current) drug therapy: Secondary | ICD-10-CM | POA: Diagnosis not present

## 2021-11-30 DIAGNOSIS — D508 Other iron deficiency anemias: Secondary | ICD-10-CM

## 2021-11-30 MED ORDER — SODIUM CHLORIDE 0.9 % IV SOLN
Freq: Once | INTRAVENOUS | Status: AC
  Filled 2021-11-30: qty 250

## 2021-11-30 MED ORDER — SODIUM CHLORIDE 0.9 % IV SOLN
200.0000 mg | Freq: Once | INTRAVENOUS | Status: AC
  Administered 2021-11-30: 200 mg via INTRAVENOUS
  Filled 2021-11-30: qty 200

## 2021-12-02 ENCOUNTER — Other Ambulatory Visit: Payer: Self-pay | Admitting: Family Medicine

## 2021-12-02 DIAGNOSIS — K219 Gastro-esophageal reflux disease without esophagitis: Secondary | ICD-10-CM

## 2021-12-02 NOTE — Telephone Encounter (Signed)
Requested medication (s) are due for refill today: na  Requested medication (s) are on the active medication list: prilosec changed from nexium  Last refill:  11/05/21 #90 2 refills  Future visit scheduled: yes in 8 months   Notes to clinic:  Pharmacy comment: Alternative Requested:MAX LIMITS EXCEED IN [place]. PRIOR AUTHORIZATION REQUIRED . Please advise if prilosec 40 mg can replace nexium 40 mg      Requested Prescriptions  Pending Prescriptions Disp Refills   omeprazole (PRILOSEC) 40 MG capsule [Pharmacy Med Name: OMEPRAZOLE DR 40 MG CAPSULE]  0    Sig: TAKE 1 CAPSULE (40 MG TOTAL) BY MOUTH DAILY AS NEEDED. ONE TAB BY MOUTH AT [facility] TIME.     Gastroenterology: Proton Pump Inhibitors Passed - 12/02/2021  8:18 AM      Passed - Valid encounter within last 12 months    Recent Outpatient Visits           3 months ago Annual physical exam   [facility] Montel Culver, MD   3 months ago Right lumbosacral radiculopathy   [facility] Montel Culver, MD   9 months ago Gastroesophageal reflux disease without esophagitis   [facility] Montel Culver, MD   1 year ago Gastroesophageal reflux disease without esophagitis   [facility] Montel Culver, MD   1 year ago Routine physical examination   [facility] Montel Culver, MD       Future Appointments             In 8 months Zigmund Daniel, Earley Abide, MD [facility], [facility]

## 2021-12-07 ENCOUNTER — Inpatient Hospital Stay: Payer: 59

## 2021-12-07 VITALS — BP 127/79 | HR 90 | Temp 98.7°F | Resp 16

## 2021-12-07 DIAGNOSIS — D508 Other iron deficiency anemias: Secondary | ICD-10-CM

## 2021-12-07 DIAGNOSIS — Z79899 Other long term (current) drug therapy: Secondary | ICD-10-CM | POA: Diagnosis not present

## 2021-12-07 DIAGNOSIS — D509 Iron deficiency anemia, unspecified: Secondary | ICD-10-CM | POA: Diagnosis not present

## 2021-12-07 MED ORDER — SODIUM CHLORIDE 0.9 % IV SOLN
INTRAVENOUS | Status: DC
  Filled 2021-12-07: qty 250

## 2021-12-07 MED ORDER — SODIUM CHLORIDE 0.9 % IV SOLN
200.0000 mg | Freq: Once | INTRAVENOUS | Status: AC
  Administered 2021-12-07: 200 mg via INTRAVENOUS
  Filled 2021-12-07: qty 200

## 2021-12-07 NOTE — Patient Instructions (Signed)

## 2021-12-16 ENCOUNTER — Other Ambulatory Visit: Payer: Self-pay | Admitting: Family Medicine

## 2021-12-16 DIAGNOSIS — K219 Gastro-esophageal reflux disease without esophagitis: Secondary | ICD-10-CM

## 2021-12-17 NOTE — Telephone Encounter (Signed)
Please review.  KP

## 2021-12-17 NOTE — Telephone Encounter (Signed)
Requested medications are due for refill today.  no  Requested medications are on the active medications list.  yes  Last refill. 11/05/2021 #90 2 refills  Future visit scheduled.   yes  Notes to clinic.  Note from pharmacy:  Pharmacy comment: Alternative Requested:MAX HAS BEEN EXCEEDED PER INSURANCE.    Requested Prescriptions  Pending Prescriptions Disp Refills   omeprazole (PRILOSEC) 40 MG capsule [Pharmacy Med Name: OMEPRAZOLE DR 40 MG CAPSULE]  0    Sig: TAKE 1 CAPSULE (40 MG TOTAL) BY MOUTH DAILY AS NEEDED. ONE TAB BY MOUTH AT [facility] TIME.     Gastroenterology: Proton Pump Inhibitors Passed - 12/16/2021  5:23 PM      Passed - Valid encounter within last 12 months    Recent Outpatient Visits           3 months ago Annual physical exam   [facility] [place], Earley Abide, MD   4 months ago Right lumbosacral radiculopathy   [facility] [place], Earley Abide, MD   10 months ago Gastroesophageal reflux disease without esophagitis   [facility] [place], Earley Abide, MD   1 year ago Gastroesophageal reflux disease without esophagitis   [facility] [place], Earley Abide, MD   1 year ago Routine physical examination   [facility], Earley Abide, MD       Future Appointments             In 8 months Zigmund Daniel, Earley Abide, MD [place] [facility], [facility]

## 2022-01-02 ENCOUNTER — Other Ambulatory Visit: Payer: Self-pay | Admitting: Family Medicine

## 2022-01-02 DIAGNOSIS — F32A Depression, unspecified: Secondary | ICD-10-CM

## 2022-01-04 NOTE — Telephone Encounter (Signed)
Requested Prescriptions  Pending Prescriptions Disp Refills  . citalopram (CELEXA) 20 MG tablet [Pharmacy Med Name: CITALOPRAM HBR 20 MG TABLET] 30 tablet 2    Sig: TAKE 1 TABLET BY MOUTH EVERY DAY     Psychiatry:  Antidepressants - SSRI Passed - 01/02/2022  9:30 AM      Passed - Completed PHQ-2 or PHQ-9 in the last 360 days      Passed - Valid encounter within last 6 months    Recent Outpatient Visits          4 months ago Annual physical exam   [facility] [place], Earley Abide, MD   4 months ago Right lumbosacral radiculopathy   [facility] [place], Earley Abide, MD   11 months ago Gastroesophageal reflux disease without esophagitis   [facility] [place], Earley Abide, MD   1 year ago Gastroesophageal reflux disease without esophagitis    [facility] [place], Earley Abide, MD   1 year ago Routine physical examination   [facility] at [facility], Earley Abide, MD      Future Appointments            In 7 months Zigmund Daniel, Earley Abide, MD [place] [facility] at [facility], [facility]

## 2022-02-09 ENCOUNTER — Encounter: Payer: Self-pay | Admitting: Family Medicine

## 2022-02-15 ENCOUNTER — Other Ambulatory Visit: Payer: Self-pay

## 2022-02-15 ENCOUNTER — Inpatient Hospital Stay (HOSPITAL_BASED_OUTPATIENT_CLINIC_OR_DEPARTMENT_OTHER): Payer: 59 | Admitting: Nurse Practitioner

## 2022-02-15 ENCOUNTER — Inpatient Hospital Stay: Payer: 59 | Attending: Nurse Practitioner

## 2022-02-15 ENCOUNTER — Encounter: Payer: Self-pay | Admitting: Nurse Practitioner

## 2022-02-15 ENCOUNTER — Ambulatory Visit: Payer: 59 | Admitting: Oncology

## 2022-02-15 ENCOUNTER — Inpatient Hospital Stay: Payer: 59

## 2022-02-15 VITALS — BP 146/94 | HR 90

## 2022-02-15 VITALS — BP 155/103 | HR 94 | Temp 98.1°F | Resp 16 | Ht 64.0 in | Wt 260.0 lb

## 2022-02-15 DIAGNOSIS — D500 Iron deficiency anemia secondary to blood loss (chronic): Secondary | ICD-10-CM

## 2022-02-15 DIAGNOSIS — D508 Other iron deficiency anemias: Secondary | ICD-10-CM

## 2022-02-15 DIAGNOSIS — N920 Excessive and frequent menstruation with regular cycle: Secondary | ICD-10-CM | POA: Diagnosis not present

## 2022-02-15 DIAGNOSIS — D573 Sickle-cell trait: Secondary | ICD-10-CM | POA: Insufficient documentation

## 2022-02-15 LAB — CBC WITH DIFFERENTIAL/PLATELET
Abs Immature Granulocytes: 0.01 10*3/uL (ref 0.00–0.07)
Basophils Absolute: 0 10*3/uL (ref 0.0–0.1)
Basophils Relative: 1 %
Eosinophils Absolute: 0.1 10*3/uL (ref 0.0–0.5)
Eosinophils Relative: 1 %
HCT: 34.5 % — ABNORMAL LOW (ref 36.0–46.0)
Hemoglobin: 11.2 g/dL — ABNORMAL LOW (ref 12.0–15.0)
Immature Granulocytes: 0 %
Lymphocytes Relative: 41 %
Lymphs Abs: 2.2 10*3/uL (ref 0.7–4.0)
MCH: 26.9 pg (ref 26.0–34.0)
MCHC: 32.5 g/dL (ref 30.0–36.0)
MCV: 82.9 fL (ref 80.0–100.0)
Monocytes Absolute: 0.4 10*3/uL (ref 0.1–1.0)
Monocytes Relative: 8 %
Neutro Abs: 2.6 10*3/uL (ref 1.7–7.7)
Neutrophils Relative %: 49 %
Platelets: 289 10*3/uL (ref 150–400)
RBC: 4.16 MIL/uL (ref 3.87–5.11)
RDW: 14.9 % (ref 11.5–15.5)
WBC: 5.3 10*3/uL (ref 4.0–10.5)
nRBC: 0 % (ref 0.0–0.2)

## 2022-02-15 LAB — IRON AND TIBC
Iron: 37 ug/dL (ref 28–170)
Saturation Ratios: 10 % — ABNORMAL LOW (ref 10.4–31.8)
TIBC: 391 ug/dL (ref 250–450)
UIBC: 354 ug/dL

## 2022-02-15 LAB — FERRITIN: Ferritin: 16 ng/mL (ref 11–307)

## 2022-02-15 MED ORDER — SODIUM CHLORIDE 0.9 % IV SOLN
200.0000 mg | Freq: Once | INTRAVENOUS | Status: AC
  Administered 2022-02-15: 200 mg via INTRAVENOUS
  Filled 2022-02-15: qty 200

## 2022-02-15 MED ORDER — VITRON-C 65-125 MG PO TABS: 1.0000 | ORAL_TABLET | Freq: Every day | ORAL | 1 refills | Status: AC

## 2022-02-15 MED ORDER — SODIUM CHLORIDE 0.9 % IV SOLN
INTRAVENOUS | Status: DC
  Filled 2022-02-15: qty 250

## 2022-02-15 NOTE — Progress Notes (Signed)
[place]  [phone] [phone]) [phone]  ID: Eileen Vargas OB: [birth year]  MR#: [id number]  CSN#:[id number]  Patient Care Team: Montel Culver, MD as PCP - General (Family Medicine)  CHIEF COMPLAINT: Iron deficiency anemia  INTERVAL HISTORY: Patient is a 31 year old female with history of iron deficiency anemia secondary to heavy menses who returns to clinic for follow-up.  She generally feels well and denies complaints..  Sleeping irregular and at times persistently heavy.  She has been unable to locate over-the-counter oral iron.  Continues to be unsure if she wants to see GYN for management of fibroids.  Denies other complaints today.  REVIEW OF SYSTEMS:   Review of Systems  Constitutional:  Positive for malaise/fatigue. Negative for chills, fever and weight loss.  HENT:  Negative for congestion, ear discharge, ear pain, sinus pain, sore throat and tinnitus.   Eyes: Negative.   Respiratory:  Positive for shortness of breath. Negative for cough and sputum production.   Cardiovascular:  Negative for chest pain, palpitations, orthopnea, claudication and leg swelling.  Gastrointestinal:  Negative for abdominal pain, blood in stool, constipation, diarrhea, heartburn, nausea and vomiting.  Genitourinary: Negative.  Negative for frequency and hematuria.  Musculoskeletal: Negative.  Negative for back pain, joint pain, myalgias and neck pain.  Skin: Negative.   Neurological:  Negative for dizziness, tingling, weakness and headaches.  Endo/Heme/Allergies: Negative.   Psychiatric/Behavioral: Negative.  Negative for depression. The patient is not nervous/anxious.   As per HPI. Otherwise, a complete review of systems is negative.  PAST MEDICAL HISTORY: Past Medical History:  Diagnosis Date   Anemia     Patient Active Problem List   Diagnosis Date Noted   Annual physical exam 08/26/2021   Right lumbosacral radiculopathy 08/12/2021   Fibroid, uterine  08/10/2021   Hypertriglyceridemia 02/08/2021   Anxiety and depression 07/13/2020   Gastroesophageal reflux disease without esophagitis 07/13/2020   Fibroid 07/13/2020   Class 3 severe obesity due to excess calories with body mass index (BMI) of 45.0 to 49.9 in adult ([place]) 07/13/2020   Sickle cell trait ([place]) 07/01/2020   Iron deficiency anemia 06/06/2020   PAST SURGICAL HISTORY: Past Surgical History:  Procedure Laterality Date   CHOLECYSTECTOMY  2013    FAMILY HISTORY: Family History  Problem Relation Age of Onset   Diabetes Maternal Grandmother    Lung cancer Maternal Grandfather    Kidney disease Mother    Clotting disorder Father    Sickle cell trait Father     ADVANCED DIRECTIVES (Y/N):  N  HEALTH MAINTENANCE: Social History   Tobacco Use   Smoking status: Never   Smokeless tobacco: Never  Vaping Use   Vaping Use: Never used  Substance Use Topics   Alcohol use: Yes    Alcohol/week: 2.0 standard drinks of alcohol    Types: 1 Glasses of wine, 1 Shots of liquor per week   Drug use: Never    Colonoscopy:  PAP:  Bone density:  Lipid panel:  No Known Allergies  Current Outpatient Medications  Medication Sig Dispense Refill   citalopram (CELEXA) 20 MG tablet TAKE 1 TABLET BY MOUTH EVERY DAY 30 tablet 2   Iron-Vitamin C (VITRON-C) 65-125 MG TABS Take 1 tablet by mouth daily. 90 tablet 1   omeprazole (PRILOSEC) 40 MG capsule Take 1 capsule (40 mg total) by mouth daily as needed. 90 capsule 0   Iron Sucrose (VENOFER IV) Inject 200 mg into the vein every 7 (seven) days. (Patient not taking: Reported  on 02/15/2022)     No current facility-administered medications for this visit.    OBJECTIVE: Vitals:   02/15/22 1446  BP: (!) 155/103  Pulse: 94  Resp: 16  Temp: 98.1 F (36.7 C)      Body mass index is 44.63 kg/m.    ECOG FS:1 - Symptomatic but completely ambulatory  General: Well-developed, well-nourished, no acute distress.  Eyes: Pallor of conjunctiva,  anicteric sclera. HEENT: Normocephalic, moist mucous membranes. Lungs: No audible wheezing or coughing. Heart: Regular rate and rhythm. Abdomen: Soft, nontender, no obvious distention. Musculoskeletal: No edema, cyanosis, or clubbing. Neuro: Alert, answering all questions appropriately. Cranial nerves grossly intact. Skin: No rashes or petechiae noted. Psych: Normal affect.   LAB RESULTS:  Lab Results  Component Value Date   NA 137 08/27/2021   K 4.3 08/27/2021   CL 102 08/27/2021   CO2 20 08/27/2021   GLUCOSE 109 (H) 08/27/2021   BUN 13 08/27/2021   CREATININE 0.81 08/27/2021   CALCIUM 8.8 08/27/2021   PROT 7.4 08/27/2021   ALBUMIN 4.4 08/27/2021   AST 19 08/27/2021   ALT 24 08/27/2021   ALKPHOS 79 08/27/2021   BILITOT <0.2 08/27/2021   GFRNONAA >60 05/29/2020    Lab Results  Component Value Date   WBC 5.3 02/15/2022   NEUTROABS 2.6 02/15/2022   HGB 11.2 (L) 02/15/2022   HCT 34.5 (L) 02/15/2022   MCV 82.9 02/15/2022   PLT 289 02/15/2022   Lab Results  Component Value Date   IRON 30 11/08/2021   TIBC 403 11/08/2021   IRONPCTSAT 7 (L) 11/08/2021   Lab Results  Component Value Date   FERRITIN 18 11/08/2021    STUDIES: No results found.  ASSESSMENT: Iron deficiency anemia.  PLAN:    1.  Iron deficiency anemia: etiology secondary to heavy menses. Last IV iron 12/07/21. Previously discussed switching from venofer to monoferric but does not appear it was approved by insurance. She will continue venofer. Hemoglobin persistently decreased. She is symptomatic. Unable to find otc iron. Will send prescription for vitron c. Can also order online. Plan for venofer today. Additional doses based on iron studies which are pending at time of visit.   2.  Sickle cell trait: No intervention needed.  She does not have any children at this time.  Likely the etiology of her mild baseline anemia.  3. Heavy menses- hx of fibroids. Previously saw Dr. Georgianne Fick and discussed  options for management in March 2022. Encouraged follow up when she's ready. Can re-refer to [employer] or elsewhere.   Disposition: Venofer today.  RTC in 3 months for labs (cbc, ferritin, iron studies) Day to week later see APP for follow up, +/- venofer- la  I spent a total of 25 minutes reviewing chart data, face-to-face evaluation with the patient, counseling and coordination of care as detailed above.  Patient expressed understanding and was in agreement with this plan. She also understands that She can call clinic at any time with any questions, concerns, or complaints.   Verlon Au, NP   02/15/2022

## 2022-02-15 NOTE — Addendum Note (Signed)
Addended by: Luella Cook on: 02/15/2022 03:14 PM   Modules accepted: Orders

## 2022-03-10 ENCOUNTER — Other Ambulatory Visit: Payer: Self-pay | Admitting: Family Medicine

## 2022-03-10 DIAGNOSIS — K219 Gastro-esophageal reflux disease without esophagitis: Secondary | ICD-10-CM

## 2022-03-10 NOTE — Telephone Encounter (Signed)
Requested Prescriptions  Pending Prescriptions Disp Refills   omeprazole (PRILOSEC) 40 MG capsule [Pharmacy Med Name: OMEPRAZOLE DR 40 MG CAPSULE] 90 capsule 1    Sig: TAKE 1 CAPSULE BY MOUTH DAILY AS NEEDED.     Gastroenterology: Proton Pump Inhibitors Passed - 03/10/2022  9:50 AM      Passed - Valid encounter within last 12 months    Recent Outpatient Visits           6 months ago Annual physical exam   [facility] [place], Earley Abide, MD   7 months ago Right lumbosacral radiculopathy   [facility] at [place], Earley Abide, MD   1 year ago Gastroesophageal reflux disease without esophagitis   [facility] at [place], Earley Abide, MD   1 year ago Gastroesophageal reflux disease without esophagitis   [facility] at [place], Earley Abide, MD   1 year ago Routine physical examination   [facility] at [facility], Earley Abide, MD       Future Appointments             In 5 months Zigmund Daniel, Earley Abide, MD [place] and [facility] Medicine at [facility], [facility]

## 2022-03-29 ENCOUNTER — Encounter: Payer: Self-pay | Admitting: Family Medicine

## 2022-03-29 ENCOUNTER — Ambulatory Visit: Payer: 59 | Admitting: Family Medicine

## 2022-03-29 VITALS — BP 136/90 | HR 76 | Ht 64.0 in | Wt 266.0 lb

## 2022-03-29 DIAGNOSIS — D219 Benign neoplasm of connective and other soft tissue, unspecified: Secondary | ICD-10-CM

## 2022-03-29 DIAGNOSIS — Z23 Encounter for immunization: Secondary | ICD-10-CM

## 2022-03-29 DIAGNOSIS — M5417 Radiculopathy, lumbosacral region: Secondary | ICD-10-CM | POA: Diagnosis not present

## 2022-03-29 DIAGNOSIS — D259 Leiomyoma of uterus, unspecified: Secondary | ICD-10-CM

## 2022-03-29 MED ORDER — DICLOFENAC SODIUM 50 MG PO TBEC: 50.0000 mg | DELAYED_RELEASE_TABLET | Freq: Two times a day (BID) | ORAL | 1 refills | Status: DC

## 2022-03-29 MED ORDER — GABAPENTIN 100 MG PO CAPS: 100.0000 mg | ORAL_CAPSULE | Freq: Every day | ORAL | 1 refills | Status: DC

## 2022-03-29 MED ORDER — CYCLOBENZAPRINE HCL 10 MG PO TABS: 10.0000 mg | ORAL_TABLET | Freq: Three times a day (TID) | ORAL | 1 refills | Status: DC | PRN

## 2022-03-29 NOTE — Patient Instructions (Signed)
-   Start diclofenac twice daily with food x 2 weeks then as-needed - Start nightly gabapentin, can increase by 1 capsule every week, do not exceed 3 capsules nightly - Can use cyclobenzaprine (muscle relaxer) as-needed for muscle tightness pain - Call number below for PT scheduling - Return in 6 weeks  [facility] Physical Therapy:  [place]:  [phone]

## 2022-03-30 NOTE — Assessment & Plan Note (Signed)
Chronic condition, had previously established with GYN, needing new referral for management options.

## 2022-03-30 NOTE — Assessment & Plan Note (Signed)
Recurrent lumbosacral pain with right-sided radiation, atraumatic onset x 5 days, worse with extension, ADLs. Examination with +SLR right, +Kemp's localizing right, tightness throughout deep hip and core structures. Given x-ray and exam findings, plan for medication management, formal PT, follow-up in 6 days.

## 2022-03-30 NOTE — Assessment & Plan Note (Signed)
Annual influenza vaccination administered today. 

## 2022-03-30 NOTE — Progress Notes (Signed)
     Primary Care / Sports Medicine Office Visit  Patient Information:  Patient ID: Eileen Vargas, female DOB: 03-Nov-1990 Age: 31 y.o. MRN: [id number]   Eileen Vargas is a pleasant 31 y.o. female presenting with the following:  Chief Complaint  Patient presents with   Right lumbosacral radiculopathy    For about 5 days, States it hurts worst with walking and better when sitting.    Vitals:   03/29/22 1529 03/29/22 1533  BP: (!) 134/90 (!) 136/90  Pulse: 76   SpO2: 99%    Vitals:   03/29/22 1529  Weight: 266 lb (120.7 kg)  Height: '5\' 4"'$  (1.626 m)   Body mass index is 45.66 kg/m.  No results found.   Independent interpretation of notes and tests performed by another provider:   Independent interpretation of lumbar x-rays dated 08/26/21 reveal straightening of thoracolumbar expected curvature, subtle listhesis of L5 on S1 with subtle osteophyte formation, no acute processes.  Procedures performed:   None  Pertinent History, Exam, Impression, and Recommendations:   Problem List Items Addressed This Visit       Nervous and Auditory   Right lumbosacral radiculopathy - Primary    Recurrent lumbosacral pain with right-sided radiation, atraumatic onset x 5 days, worse with extension, ADLs. Examination with +SLR right, +Kemp's localizing right, tightness throughout deep hip and core structures. Given x-ray and exam findings, plan for medication management, formal PT, follow-up in 6 days.      Relevant Medications   gabapentin (NEURONTIN) 100 MG capsule   diclofenac (VOLTAREN) 50 MG EC tablet   cyclobenzaprine (FLEXERIL) 10 MG tablet     Genitourinary   Fibroid, uterine    Chronic condition, had previously established with GYN, needing new referral for management options.      Relevant Orders   Ambulatory referral to Gynecology     Other   Fibroid   Relevant Orders   Ambulatory referral to Gynecology   Need for immunization against influenza    Annual influenza  vaccination administered today.      Relevant Orders   Flu Vaccine QUAD 84moIM (Fluarix, Fluzone & Alfiuria Quad PF) (Completed)     Orders & Medications Meds ordered this encounter  Medications   gabapentin (NEURONTIN) 100 MG capsule    Sig: Take 1 capsule (100 mg total) by mouth at bedtime. Can increase by 1 capsule nightly every week until symptoms controlled. Do not exceed 3 capsules nightly.    Dispense:  90 capsule    Refill:  1   diclofenac (VOLTAREN) 50 MG EC tablet    Sig: Take 1 tablet (50 mg total) by mouth 2 (two) times daily. X 2 weeks then twice daily as-needed    Dispense:  60 tablet    Refill:  1   cyclobenzaprine (FLEXERIL) 10 MG tablet    Sig: Take 1 tablet (10 mg total) by mouth 3 (three) times daily as needed for muscle spasms.    Dispense:  30 tablet    Refill:  1   Orders Placed This Encounter  Procedures   Flu Vaccine QUAD 661moM (Fluarix, Fluzone & Alfiuria Quad PF)   Ambulatory referral to Gynecology     Return in about 6 weeks (around 05/10/2022).     JaMontel CulverMD, CASouth [facility]

## 2022-04-06 ENCOUNTER — Other Ambulatory Visit: Payer: Self-pay | Admitting: Family Medicine

## 2022-04-06 DIAGNOSIS — F32A Depression, unspecified: Secondary | ICD-10-CM

## 2022-04-12 ENCOUNTER — Ambulatory Visit: Payer: 59 | Attending: Family Medicine | Admitting: Physical Therapy

## 2022-04-12 DIAGNOSIS — M5417 Radiculopathy, lumbosacral region: Secondary | ICD-10-CM | POA: Diagnosis not present

## 2022-04-12 DIAGNOSIS — M6281 Muscle weakness (generalized): Secondary | ICD-10-CM

## 2022-04-12 DIAGNOSIS — M2560 Stiffness of unspecified joint, not elsewhere classified: Secondary | ICD-10-CM | POA: Diagnosis not present

## 2022-04-20 ENCOUNTER — Ambulatory Visit: Payer: 59 | Admitting: Physical Therapy

## 2022-04-20 DIAGNOSIS — M5417 Radiculopathy, lumbosacral region: Secondary | ICD-10-CM

## 2022-04-20 DIAGNOSIS — M6281 Muscle weakness (generalized): Secondary | ICD-10-CM

## 2022-04-20 DIAGNOSIS — M2560 Stiffness of unspecified joint, not elsewhere classified: Secondary | ICD-10-CM

## 2022-04-22 ENCOUNTER — Encounter: Payer: Self-pay | Admitting: Physical Therapy

## 2022-04-22 NOTE — Therapy (Signed)
OUTPATIENT PHYSICAL THERAPY THORACOLUMBAR EVALUATION   Patient Name: Eileen Vargas MRN: [id number] DOB:June 23, 1990, 31 y.o., female Today's Date: 04/12/2022  END OF SESSION:  PT End of Session - 04/22/22 1644     Visit Number 1    Number of Visits 9    Date for PT Re-Evaluation 05/10/22    PT Start Time 1640    PT Stop Time 1027    PT Time Calculation (min) 52 min             Past Medical History:  Diagnosis Date   Anemia    Past Surgical History:  Procedure Laterality Date   CHOLECYSTECTOMY  2013   Patient Active Problem List   Diagnosis Date Noted   Need for immunization against influenza 03/29/2022   Annual physical exam 08/26/2021   Right lumbosacral radiculopathy 08/12/2021   Fibroid, uterine 08/10/2021   Hypertriglyceridemia 02/08/2021   Anxiety and depression 07/13/2020   Gastroesophageal reflux disease without esophagitis 07/13/2020   Fibroid 07/13/2020   Class 3 severe obesity due to excess calories with body mass index (BMI) of 45.0 to 49.9 in adult ([place]) 07/13/2020   Sickle cell trait ([place]) 07/01/2020   Iron deficiency anemia 06/06/2020    PCP: Montel Culver, MD  REFERRING PROVIDER: Montel Culver, MD  REFERRING DIAG: Right lumbosacral radiculopathy  Rationale for Evaluation and Treatment: Rehabilitation  THERAPY DIAG:  Right lumbosacral radiculopathy  Joint stiffness  Muscle weakness (generalized)  ONSET DATE: 04/2020  SUBJECTIVE:                                                                                                                                                                                           SUBJECTIVE STATEMENT: Pt. Reports chronic h/o low back pain with R LE radicular symptoms into R thigh/ calf.  Pt. Reports she had an acute flare-up of chronic low back issues with prolonged standing 2 weeks ago.  No pain in seated posture.  Pt. Works from home at [employer].  Pt. C/o 9/10 R low back with shopping 1-2 hours.   Pt. Taking Gabapentin at night.  Pt. States pain worsens t/o the day.    PERTINENT HISTORY:   Chief Complaint  Patient presents with   Right lumbosacral radiculopathy      For about 5 days, States it hurts worst with walking and better when sitting.          Vitals:    03/29/22 1529 03/29/22 1533  BP: (!) 134/90 (!) 136/90  Pulse: 76    SpO2: 99%         Vitals:    03/29/22 1529  Weight: 266 lb (120.7 kg)  Height: '5\' 4"'$  (1.626 m)    Body mass index is 45.66 kg/m.   Imaging Results  No results found.      Independent interpretation of notes and tests performed by another provider:    Independent interpretation of lumbar x-rays dated 08/26/21 reveal straightening of thoracolumbar expected curvature, subtle listhesis of L5 on S1 with subtle osteophyte formation, no acute processes.   Procedures performed:    None   Pertinent History, Exam, Impression, and Recommendations:    Problem List Items Addressed This Visit              Nervous and Auditory    Right lumbosacral radiculopathy - Primary      Recurrent lumbosacral pain with right-sided radiation, atraumatic onset x 5 days, worse with extension, ADLs. Examination with +SLR right, +Kemp's localizing right, tightness throughout deep hip and core structures. Given x-ray and exam findings, plan for medication management, formal PT, follow-up in 6 days.        Relevant Medications    gabapentin (NEURONTIN) 100 MG capsule    diclofenac (VOLTAREN) 50 MG EC tablet    cyclobenzaprine (FLEXERIL) 10 MG tablet   PAIN:  Are you having pain? Yes: NPRS scale: 0/10 Pain location: Low back Pain description: Aching Aggravating factors: Prolonged standing/ shopping Relieving factors: Sitting  PRECAUTIONS: None  WEIGHT BEARING RESTRICTIONS: No  FALLS:  Has patient fallen in last 6 months? No  LIVING ENVIRONMENT: Lives with: lives with their partner Lives in: House/apartment Has following equipment at home:  None  OCCUPATION: Works from home/ sedentary job  PLOF: Independent  PATIENT GOALS: Decrease low back pain/ radicular symptoms.   NEXT MD VISIT:  PRN   OBJECTIVE:   DIAGNOSTIC FINDINGS:  FINDINGS: There are 5 non-rib-bearing lumbar vertebra with unfused transverse process apophyses at L1. normal alignment. Normal vertebral body heights. Minor L5-S1 anterior spurring. The remaining disc spaces are preserved. No evidence of fracture, focal bone abnormality or pars defects. Cholecystectomy clips in the right upper quadrant. Left lower pole renal stone, seen on 05/29/2020 abdominal CT.   IMPRESSION: Minimal spondylosis with spurring at L5-S1.     Electronically Signed   By: Keith Rake M.D.   On: 08/27/2021 16:06  PATIENT SURVEYS:  FOTO initial 49/ goal 71  SCREENING FOR RED FLAGS: Bowel or bladder incontinence: No Spinal tumors: No Cauda equina syndrome: No Compression fracture: No Abdominal aneurysm: No  COGNITION: Overall cognitive status: Within functional limits for tasks assessed     SENSATION: WFL  MUSCLE LENGTH: Hamstrings: Right 65 deg; Left 70 deg Thomas test: N/T  POSTURE: No Significant postural limitations  PALPATION: Hypomobility noted in lumbar spine with unilateral/ central PA mobs.  No tenderness noted.   LUMBAR ROM:   AROM eval  Flexion 25% limited  Extension 50% limited  Right lateral flexion WFL  Left lateral flexion WFL  Right rotation WFL  Left rotation WFL   (Blank rows = not tested)  No change in symptoms with repeated movement/ extension  LOWER EXTREMITY ROM:     B LE AROM WFL.  Good symmetry between L and R.  LOWER EXTREMITY MMT:    MMT Right eval Left eval  Hip flexion 5 5  Hip extension    Hip abduction 4+ 4+  Hip adduction    Hip internal rotation 5 5  Hip external rotation 5 5  Knee flexion 5 5  Knee extension 5 5  Ankle dorsiflexion 5 5  Ankle plantarflexion    Ankle inversion    Ankle eversion      (Blank rows = not tested)  LUMBAR SPECIAL TESTS:  Straight leg raise test: Positive, FABER test: Negative, and Gaenslen's test: Negative  FUNCTIONAL TESTS:  TBD  GAIT: Distance walked: in clinic Assistive device utilized: None Level of assistance: Complete Independence Comments: normalized gait  TODAY'S TREATMENT:                                                                                                                              DATE: 04/12/22   MH to low back in seated posture after tx.  See HEP  PATIENT EDUCATION:  Education details: Access Code: [email or web address] Person educated: Patient Education method: Explanation, Demonstration, and Handouts Education comprehension: verbalized understanding and returned demonstration  HOME EXERCISE PROGRAM: Access Code: [email or web address] URL: [email or web address] Date: 04/12/2022 Prepared by: Dorcas Carrow  Exercises - Supine Lower Trunk Rotation  - 2 x daily - 7 x weekly - 1 sets - 3 reps - Hooklying Single Knee to Chest Stretch  - 2 x daily - 7 x weekly - 1 sets - 3 reps - Seated Hamstring Stretch  - 2 x daily - 7 x weekly - 1 sets - 3 reps - 20 seconds hold - Standing Hamstring Stretch with Step  - 2 x daily - 7 x weekly - 1 sets - 3 reps - 20 seconds hold - Standing Lumbar Extension  - 2 x daily - 7 x weekly - 1 sets - 5 reps  ASSESSMENT:  CLINICAL IMPRESSION: Patient is a pleasant 31 y.o. female who was seen today for physical therapy evaluation and treatment for R low back pain with radicular symptoms.  Pt. Presents with no pain in back currently at rest and >9/10 pain with prolonged standing/walking/shopping.  Pt. Presents good LE muscle strength.  Limitations in lumbar extension/ core stability noted.  Pt. Will benefit from short-term skilled PT services to increase generalized flexibility/ strength to improve pain-free mobility.    OBJECTIVE IMPAIRMENTS: decreased activity tolerance, decreased endurance, decreased  mobility, decreased ROM, decreased strength, hypomobility, impaired flexibility, improper body mechanics, postural dysfunction, obesity, and pain.   ACTIVITY LIMITATIONS: carrying, lifting, bending, sitting, standing, squatting, transfers, bed mobility, and locomotion level  PARTICIPATION LIMITATIONS: cleaning, laundry, driving, shopping, community activity, and occupation  PERSONAL FACTORS: Age and Past/current experiences are also affecting patient's functional outcome.   REHAB POTENTIAL: Good  CLINICAL DECISION MAKING: Stable/uncomplicated  EVALUATION COMPLEXITY: Low   GOALS: Goals reviewed with patient? Yes  SHORT TERM GOALS: Target date: 04/26/22  Pt. Independent with HEP to increase lumbar ROM/ core stability to improve pain-free mobility.  Baseline:  see above Goal status: INITIAL   LONG TERM GOALS: Target date: 05/10/22  Pt. Will increase FOTO to 71 to improve pain-free mobility.   Baseline:  initial FOTO 49 Goal status: INITIAL  2.  Pt. Will report no low back pain with rolling over/ bed  mobility to improve pain-free mobility.  Baseline: increase in R glut/ low back symptoms getting off mat table Goal status: INITIAL  3.  Pt. Will report no increase in low back pain/ R LE radicular symptoms with daily household chores/ work.   Baseline: See above Goal status: INITIAL   PLAN:  PT FREQUENCY: 1-2x/week  PT DURATION: 4 weeks  PLANNED INTERVENTIONS: Therapeutic exercises, Therapeutic activity, Neuromuscular re-education, Patient/Family education, Self Care, Joint mobilization, Joint manipulation, Dry Needling, Electrical stimulation, Spinal manipulation, Spinal mobilization, Cryotherapy, Moist heat, Manual therapy, and Re-evaluation.  PLAN FOR NEXT SESSION: Manual tx. To low back/ reassess HEP  Pura Spice, PT, DPT # [id number] 04/22/2022, 4:48 PM

## 2022-04-23 NOTE — Therapy (Signed)
OUTPATIENT PHYSICAL THERAPY THORACOLUMBAR TREATMENT   Patient Name: Eileen Vargas MRN: [id number] DOB:1990/11/26, 31 y.o., female Today's Date: 04/20/2022  END OF SESSION:  PT End of Session - 04/23/22 0608     Visit Number 2    Number of Visits 9    Date for PT Re-Evaluation 05/10/22    PT Start Time 0093    PT Stop Time 1430    PT Time Calculation (min) 41 min             Past Medical History:  Diagnosis Date   Anemia    Past Surgical History:  Procedure Laterality Date   CHOLECYSTECTOMY  2013   Patient Active Problem List   Diagnosis Date Noted   Need for immunization against influenza 03/29/2022   Annual physical exam 08/26/2021   Right lumbosacral radiculopathy 08/12/2021   Fibroid, uterine 08/10/2021   Hypertriglyceridemia 02/08/2021   Anxiety and depression 07/13/2020   Gastroesophageal reflux disease without esophagitis 07/13/2020   Fibroid 07/13/2020   Class 3 severe obesity due to excess calories with body mass index (BMI) of 45.0 to 49.9 in adult ([place]) 07/13/2020   Sickle cell trait ([place]) 07/01/2020   Iron deficiency anemia 06/06/2020    PCP: Montel Culver, MD  REFERRING PROVIDER: Montel Culver, MD  REFERRING DIAG: Right lumbosacral radiculopathy  Rationale for Evaluation and Treatment: Rehabilitation  THERAPY DIAG:  Right lumbosacral radiculopathy  Joint stiffness  Muscle weakness (generalized)  ONSET DATE: 04/2020  SUBJECTIVE:                                                                                                                                                                                           SUBJECTIVE STATEMENT:  EVALUATION Pt. Reports chronic h/o low back pain with R LE radicular symptoms into R thigh/ calf.  Pt. Reports she had an acute flare-up of chronic low back issues with prolonged standing 2 weeks ago.  No pain in seated posture.  Pt. Works from home at [employer].  Pt. C/o 9/10 R low back with shopping  1-2 hours.  Pt. Taking Gabapentin at night.  Pt. States pain worsens t/o the day.    PERTINENT HISTORY:   Chief Complaint  Patient presents with   Right lumbosacral radiculopathy      For about 5 days, States it hurts worst with walking and better when sitting.          Vitals:    03/29/22 1529 03/29/22 1533  BP: (!) 134/90 (!) 136/90  Pulse: 76    SpO2: 99%         Vitals:    03/29/22  1529  Weight: 266 lb (120.7 kg)  Height: _0  (1.626 m)    Body mass index is 45.66 kg/m.   Imaging Results  No results found.      Independent interpretation of notes and tests performed by another provider:    Independent interpretation of lumbar x-rays dated 08/26/21 reveal straightening of thoracolumbar expected curvature, subtle listhesis of L5 on S1 with subtle osteophyte formation, no acute processes.   Procedures performed:    None   Pertinent History, Exam, Impression, and Recommendations:    Problem List Items Addressed This Visit              Nervous and Auditory    Right lumbosacral radiculopathy - Primary      Recurrent lumbosacral pain with right-sided radiation, atraumatic onset x 5 days, worse with extension, ADLs. Examination with +SLR right, +Kemp's localizing right, tightness throughout deep hip and core structures. Given x-ray and exam findings, plan for medication management, formal PT, follow-up in 6 days.        Relevant Medications    gabapentin (NEURONTIN) 100 MG capsule    diclofenac (VOLTAREN) 50 MG EC tablet    cyclobenzaprine (FLEXERIL) 10 MG tablet   PAIN:  Are you having pain? Yes: NPRS scale: 0/10 Pain location: Low back Pain description: Aching Aggravating factors: Prolonged standing/ shopping Relieving factors: Sitting  PRECAUTIONS: None  WEIGHT BEARING RESTRICTIONS: No  FALLS:  Has patient fallen in last 6 months? No  LIVING ENVIRONMENT: Lives with: lives with their partner Lives in: House/apartment Has following equipment at home:  None  OCCUPATION: Works from home/ sedentary job  PLOF: Independent  PATIENT GOALS: Decrease low back pain/ radicular symptoms.   NEXT MD VISIT:  PRN   OBJECTIVE:   DIAGNOSTIC FINDINGS:  FINDINGS: There are 5 non-rib-bearing lumbar vertebra with unfused transverse process apophyses at L1. normal alignment. Normal vertebral body heights. Minor L5-S1 anterior spurring. The remaining disc spaces are preserved. No evidence of fracture, focal bone abnormality or pars defects. Cholecystectomy clips in the right upper quadrant. Left lower pole renal stone, seen on 05/29/2020 abdominal CT.   IMPRESSION: Minimal spondylosis with spurring at L5-S1.     Electronically Signed   By: Keith Rake M.D.   On: 08/27/2021 16:06  PATIENT SURVEYS:  FOTO initial 49/ goal 71  SCREENING FOR RED FLAGS: Bowel or bladder incontinence: No Spinal tumors: No Cauda equina syndrome: No Compression fracture: No Abdominal aneurysm: No  COGNITION: Overall cognitive status: Within functional limits for tasks assessed     SENSATION: WFL  MUSCLE LENGTH: Hamstrings: Right 65 deg; Left 70 deg Thomas test: N/T  POSTURE: No Significant postural limitations  PALPATION: Hypomobility noted in lumbar spine with unilateral/ central PA mobs.  No tenderness noted.   LUMBAR ROM:   AROM eval  Flexion 25% limited  Extension 50% limited  Right lateral flexion WFL  Left lateral flexion WFL  Right rotation WFL  Left rotation WFL   (Blank rows = not tested)  No change in symptoms with repeated movement/ extension  LOWER EXTREMITY ROM:     B LE AROM WFL.  Good symmetry between L and R.  LOWER EXTREMITY MMT:    MMT Right eval Left eval  Hip flexion 5 5  Hip extension    Hip abduction 4+ 4+  Hip adduction    Hip internal rotation 5 5  Hip external rotation 5 5  Knee flexion 5 5  Knee extension 5 5  Ankle dorsiflexion 5  5  Ankle plantarflexion    Ankle inversion    Ankle eversion      (Blank rows = not tested)  LUMBAR SPECIAL TESTS:  Straight leg raise test: Positive, FABER test: Negative, and Gaenslen's test: Negative  FUNCTIONAL TESTS:  TBD  GAIT: Distance walked: in clinic Assistive device utilized: None Level of assistance: Complete Independence Comments: normalized gait  TODAY'S TREATMENT:                                                                                                                              DATE: 04/20/22   Subjective:  Pt. Entered PT with no c/o low back pain or radicular symptoms.  Pt. Reports compliance and benefit from HEP/ stretches.   There.ex.:  Reviewed HEP/ added OP with trunk rotn.   Prone press-ups at end of tx.   Manual tx:  Supine LE/lumbar generalized stretches with focus on knee to chest/ hamstring with added neural glides on R LE.  No increase c/o symptoms.   Prone STM to lumbar paraspinals (added use of Hypervolt).   MH to lumbar spine (varying places during [facility]).   Prone Grade II-III PA mobs. To lumbar spine (central and unilateral)- 3x20 sec.  No c/o tenderness reported.     PATIENT EDUCATION:  Education details: Access Code: [email or web address] Person educated: Patient Education method: Explanation, Demonstration, and Handouts Education comprehension: verbalized understanding and returned demonstration  HOME EXERCISE PROGRAM: Access Code: [email or web address] URL: [email or web address] Date: 04/12/2022 Prepared by: Dorcas Carrow  Exercises - Supine Lower Trunk Rotation  - 2 x daily - 7 x weekly - 1 sets - 3 reps - Hooklying Single Knee to Chest Stretch  - 2 x daily - 7 x weekly - 1 sets - 3 reps - Seated Hamstring Stretch  - 2 x daily - 7 x weekly - 1 sets - 3 reps - 20 seconds hold - Standing Hamstring Stretch with Step  - 2 x daily - 7 x weekly - 1 sets - 3 reps - 20 seconds hold - Standing Lumbar Extension  - 2 x daily - 7 x weekly - 1 sets - 5 reps  ASSESSMENT:  CLINICAL IMPRESSION: Pt. presents with  no pain in back currently at rest and demonstrates good understanding of HEP.  Pt. Has good lumbar extension in prone position with no radicular symptoms.  Moderate lumbar hypomobility with PA mobs. To low lumbar spine.  Unable to reproduce back pain or radicular symptoms during manual tx.  Pt. Will benefit from short-term skilled PT services to increase generalized flexibility/ strength to improve pain-free mobility.    OBJECTIVE IMPAIRMENTS: decreased activity tolerance, decreased endurance, decreased mobility, decreased ROM, decreased strength, hypomobility, impaired flexibility, improper body mechanics, postural dysfunction, obesity, and pain.   ACTIVITY LIMITATIONS: carrying, lifting, bending, sitting, standing, squatting, transfers, bed mobility, and locomotion level  PARTICIPATION LIMITATIONS: cleaning, laundry, driving, shopping, community activity, and occupation  PERSONAL FACTORS: Age and Past/current experiences are also  affecting patient's functional outcome.   REHAB POTENTIAL: Good  CLINICAL DECISION MAKING: Stable/uncomplicated  EVALUATION COMPLEXITY: Low   GOALS: Goals reviewed with patient? Yes  SHORT TERM GOALS: Target date: 04/26/22  Pt. Independent with HEP to increase lumbar ROM/ core stability to improve pain-free mobility.  Baseline:  see above Goal status: INITIAL   LONG TERM GOALS: Target date: 05/10/22  Pt. Will increase FOTO to 71 to improve pain-free mobility.   Baseline:  initial FOTO 49 Goal status: INITIAL  2.  Pt. Will report no low back pain with rolling over/ bed mobility to improve pain-free mobility.  Baseline: increase in R glut/ low back symptoms getting off mat table Goal status: INITIAL  3.  Pt. Will report no increase in low back pain/ R LE radicular symptoms with daily household chores/ work.   Baseline: See above Goal status: INITIAL   PLAN:  PT FREQUENCY: 1-2x/week  PT DURATION: 4 weeks  PLANNED INTERVENTIONS: Therapeutic  exercises, Therapeutic activity, Neuromuscular re-education, Patient/Family education, Self Care, Joint mobilization, Joint manipulation, Dry Needling, Electrical stimulation, Spinal manipulation, Spinal mobilization, Cryotherapy, Moist heat, Manual therapy, and Re-evaluation.  PLAN FOR NEXT SESSION:   Issue core stability ex. Next tx.   Pura Spice, PT, DPT # [phone] 04/23/2022, 6:10 AM

## 2022-04-27 ENCOUNTER — Ambulatory Visit: Payer: 59 | Attending: Family Medicine | Admitting: Physical Therapy

## 2022-04-27 ENCOUNTER — Encounter: Payer: Self-pay | Admitting: Physical Therapy

## 2022-04-27 DIAGNOSIS — M6281 Muscle weakness (generalized): Secondary | ICD-10-CM

## 2022-04-27 DIAGNOSIS — M2560 Stiffness of unspecified joint, not elsewhere classified: Secondary | ICD-10-CM | POA: Diagnosis not present

## 2022-04-27 DIAGNOSIS — M5417 Radiculopathy, lumbosacral region: Secondary | ICD-10-CM

## 2022-04-27 NOTE — Therapy (Signed)
OUTPATIENT PHYSICAL THERAPY THORACOLUMBAR TREATMENT   Patient Name: Eileen Vargas MRN: [id number] DOB:1990-06-05, 32 y.o., female Today's Date: 04/27/2022  END OF SESSION:  PT End of Session - 04/27/22 1346     Visit Number 3    Number of Visits 9    Date for PT Re-Evaluation 05/10/22    PT Start Time 1346    PT Stop Time 1433    PT Time Calculation (min) 47 min             Past Medical History:  Diagnosis Date   Anemia    Past Surgical History:  Procedure Laterality Date   CHOLECYSTECTOMY  2013   Patient Active Problem List   Diagnosis Date Noted   Need for immunization against influenza 03/29/2022   Annual physical exam 08/26/2021   Right lumbosacral radiculopathy 08/12/2021   Fibroid, uterine 08/10/2021   Hypertriglyceridemia 02/08/2021   Anxiety and depression 07/13/2020   Gastroesophageal reflux disease without esophagitis 07/13/2020   Fibroid 07/13/2020   Class 3 severe obesity due to excess calories with body mass index (BMI) of 45.0 to 49.9 in adult ([place]) 07/13/2020   Sickle cell trait ([place]) 07/01/2020   Iron deficiency anemia 06/06/2020    PCP: Montel Culver, MD  REFERRING PROVIDER: Montel Culver, MD  REFERRING DIAG: Right lumbosacral radiculopathy  Rationale for Evaluation and Treatment: Rehabilitation  THERAPY DIAG:  Right lumbosacral radiculopathy  Joint stiffness  Muscle weakness (generalized)  ONSET DATE: 04/2020  SUBJECTIVE:                                                                                                                                                                                           SUBJECTIVE STATEMENT:  EVALUATION Pt. Reports chronic h/o low back pain with R LE radicular symptoms into R thigh/ calf.  Pt. Reports she had an acute flare-up of chronic low back issues with prolonged standing 2 weeks ago.  No pain in seated posture.  Pt. Works from home at [employer].  Pt. C/o 9/10 R low back with shopping 1-2  hours.  Pt. Taking Gabapentin at night.  Pt. States pain worsens t/o the day.    PERTINENT HISTORY:   Chief Complaint  Patient presents with   Right lumbosacral radiculopathy      For about 5 days, States it hurts worst with walking and better when sitting.          Vitals:    03/29/22 1529 03/29/22 1533  BP: (!) 134/90 (!) 136/90  Pulse: 76    SpO2: 99%         Vitals:    03/29/22  1529  Weight: 266 lb (120.7 kg)  Height: _0  (1.626 m)    Body mass index is 45.66 kg/m.   Imaging Results  No results found.      Independent interpretation of notes and tests performed by another provider:    Independent interpretation of lumbar x-rays dated 08/26/21 reveal straightening of thoracolumbar expected curvature, subtle listhesis of L5 on S1 with subtle osteophyte formation, no acute processes.   Procedures performed:    None   Pertinent History, Exam, Impression, and Recommendations:    Problem List Items Addressed This Visit              Nervous and Auditory    Right lumbosacral radiculopathy - Primary      Recurrent lumbosacral pain with right-sided radiation, atraumatic onset x 5 days, worse with extension, ADLs. Examination with +SLR right, +Kemp's localizing right, tightness throughout deep hip and core structures. Given x-ray and exam findings, plan for medication management, formal PT, follow-up in 6 days.        Relevant Medications    gabapentin (NEURONTIN) 100 MG capsule    diclofenac (VOLTAREN) 50 MG EC tablet    cyclobenzaprine (FLEXERIL) 10 MG tablet   PAIN:  Are you having pain? Yes: NPRS scale: 0/10 Pain location: Low back Pain description: Aching Aggravating factors: Prolonged standing/ shopping Relieving factors: Sitting  PRECAUTIONS: None  WEIGHT BEARING RESTRICTIONS: No  FALLS:  Has patient fallen in last 6 months? No  LIVING ENVIRONMENT: Lives with: lives with their partner Lives in: House/apartment Has following equipment at home:  None  OCCUPATION: Works from home/ sedentary job  PLOF: Independent  PATIENT GOALS: Decrease low back pain/ radicular symptoms.   NEXT MD VISIT:  PRN   OBJECTIVE:   DIAGNOSTIC FINDINGS:  FINDINGS: There are 5 non-rib-bearing lumbar vertebra with unfused transverse process apophyses at L1. normal alignment. Normal vertebral body heights. Minor L5-S1 anterior spurring. The remaining disc spaces are preserved. No evidence of fracture, focal bone abnormality or pars defects. Cholecystectomy clips in the right upper quadrant. Left lower pole renal stone, seen on 05/29/2020 abdominal CT.   IMPRESSION: Minimal spondylosis with spurring at L5-S1.     Electronically Signed   By: Keith Rake M.D.   On: 08/27/2021 16:06  PATIENT SURVEYS:  FOTO initial 49/ goal 71  SCREENING FOR RED FLAGS: Bowel or bladder incontinence: No Spinal tumors: No Cauda equina syndrome: No Compression fracture: No Abdominal aneurysm: No  COGNITION: Overall cognitive status: Within functional limits for tasks assessed     SENSATION: WFL  MUSCLE LENGTH: Hamstrings: Right 65 deg; Left 70 deg Thomas test: N/T  POSTURE: No Significant postural limitations  PALPATION: Hypomobility noted in lumbar spine with unilateral/ central PA mobs.  No tenderness noted.   LUMBAR ROM:   AROM eval  Flexion 25% limited  Extension 50% limited  Right lateral flexion WFL  Left lateral flexion WFL  Right rotation WFL  Left rotation WFL   (Blank rows = not tested)  No change in symptoms with repeated movement/ extension  LOWER EXTREMITY ROM:     B LE AROM WFL.  Good symmetry between L and R.  LOWER EXTREMITY MMT:    MMT Right eval Left eval  Hip flexion 5 5  Hip extension    Hip abduction 4+ 4+  Hip adduction    Hip internal rotation 5 5  Hip external rotation 5 5  Knee flexion 5 5  Knee extension 5 5  Ankle dorsiflexion 5  5  Ankle plantarflexion    Ankle inversion    Ankle eversion      (Blank rows = not tested)  LUMBAR SPECIAL TESTS:  Straight leg raise test: Positive, FABER test: Negative, and Gaenslen's test: Negative  FUNCTIONAL TESTS:  TBD  GAIT: Distance walked: in clinic Assistive device utilized: None Level of assistance: Complete Independence Comments: normalized gait  TODAY'S TREATMENT:                                                                                                                              DATE: 04/27/22   Subjective:  Pt. Entered PT with no c/o low back pain or radicular symptoms.  Pt. Reports compliance and benefit from HEP/ stretches.  MD f/u with Dr. Zigmund Daniel on 05/12/22.    There.ex.:  Reassessment of lumbar AROM.  No pain with flexion/ extension or rotn.  (Good mobility).    TrA contraction/ marching/ dead bug/ bicycle kicks/ bridging/ hip abduction with BTB/ hip flexion BTB/ bridging 10x each (good technique).  See updated HEP (below).     Manual tx:  Supine LE/lumbar generalized stretches with focus on knee to chest/ hamstring with added neural glides on R LE.  No increase c/o symptoms.   Prone Grade II-III PA mobs. To lumbar spine (central and unilateral)- 3x20 sec.  No c/o tenderness reported.   Prone STM to lumbar paraspinals (added use of Hypervolt).   MH to lumbar spine (varying places during [facility]).     PATIENT EDUCATION:  Education details: Access Code: [email or web address] Person educated: Patient Education method: Explanation, Demonstration, and Handouts Education comprehension: verbalized understanding and returned demonstration  HOME EXERCISE PROGRAM: Access Code: [email or web address] URL: [email or web address] Date: 04/12/2022 Prepared by: Dorcas Carrow  Exercises - Supine Lower Trunk Rotation  - 2 x daily - 7 x weekly - 1 sets - 3 reps - Hooklying Single Knee to Chest Stretch  - 2 x daily - 7 x weekly - 1 sets - 3 reps - Seated Hamstring Stretch  - 2 x daily - 7 x weekly - 1 sets - 3 reps - 20 seconds hold -  Standing Hamstring Stretch with Step  - 2 x daily - 7 x weekly - 1 sets - 3 reps - 20 seconds hold - Standing Lumbar Extension  - 2 x daily - 7 x weekly - 1 sets - 5 reps  Access Code: [email or web address] URL: [email or web address] Date: 04/27/2022 Prepared by: Dorcas Carrow  Exercises - Supine Lower Trunk Rotation  - 2 x daily - 7 x weekly - 1 sets - 3 reps - Hooklying Single Knee to Chest Stretch  - 2 x daily - 7 x weekly - 1 sets - 3 reps - Seated Hamstring Stretch  - 2 x daily - 7 x weekly - 1 sets - 3 reps - 20 seconds hold - Standing Hamstring Stretch with Step  - 2 x daily - 7 x weekly - 1 sets - 3  reps - 20 seconds hold - Standing Lumbar Extension  - 2 x daily - 7 x weekly - 1 sets - 5 reps - Supine Posterior Pelvic Tilt  - 1 x daily - 7 x weekly - 1 sets - 10 reps - Dead Bug  - 1 x daily - 7 x weekly - 1 sets - 20 reps - Supine Bridge  - 1 x daily - 7 x weekly - 1 sets - 10 reps  ASSESSMENT:  CLINICAL IMPRESSION: Pt. presents with no pain in back currently at rest and demonstrates good understanding of HEP.  Pt. Presents with good lumbar AROM (all planes of movement) with no radicular symptoms today.  Moderate lumbar hypomobility with PA mobs. To low lumbar spine.   Pt. Will benefit from short-term skilled PT services to increase generalized flexibility/ strength to improve pain-free mobility.    OBJECTIVE IMPAIRMENTS: decreased activity tolerance, decreased endurance, decreased mobility, decreased ROM, decreased strength, hypomobility, impaired flexibility, improper body mechanics, postural dysfunction, obesity, and pain.   ACTIVITY LIMITATIONS: carrying, lifting, bending, sitting, standing, squatting, transfers, bed mobility, and locomotion level  PARTICIPATION LIMITATIONS: cleaning, laundry, driving, shopping, community activity, and occupation  PERSONAL FACTORS: Age and Past/current experiences are also affecting patient's functional outcome.   REHAB POTENTIAL:  Good  CLINICAL DECISION MAKING: Stable/uncomplicated  EVALUATION COMPLEXITY: Low   GOALS: Goals reviewed with patient? Yes  SHORT TERM GOALS: Target date: 04/26/22  Pt. Independent with HEP to increase lumbar ROM/ core stability to improve pain-free mobility.  Baseline:  see above Goal status: Goal met   LONG TERM GOALS: Target date: 05/10/22  Pt. Will increase FOTO to 71 to improve pain-free mobility.   Baseline:  initial FOTO 49 Goal status: INITIAL  2.  Pt. Will report no low back pain with rolling over/ bed mobility to improve pain-free mobility.  Baseline: increase in R glut/ low back symptoms getting off mat table Goal status: INITIAL  3.  Pt. Will report no increase in low back pain/ R LE radicular symptoms with daily household chores/ work.   Baseline: See above Goal status: INITIAL   PLAN:  PT FREQUENCY: 1-2x/week  PT DURATION: 4 weeks  PLANNED INTERVENTIONS: Therapeutic exercises, Therapeutic activity, Neuromuscular re-education, Patient/Family education, Self Care, Joint mobilization, Joint manipulation, Dry Needling, Electrical stimulation, Spinal manipulation, Spinal mobilization, Cryotherapy, Moist heat, Manual therapy, and Re-evaluation.  PLAN FOR NEXT SESSION:  Reassess core ex./ progress  Pura Spice, PT, DPT # [phone] 04/27/2022, 7:13 PM

## 2022-05-02 ENCOUNTER — Ambulatory Visit: Payer: 59 | Admitting: Physical Therapy

## 2022-05-04 ENCOUNTER — Ambulatory Visit: Payer: 59 | Admitting: Obstetrics and Gynecology

## 2022-05-04 ENCOUNTER — Telehealth: Payer: Self-pay | Admitting: Physical Therapy

## 2022-05-04 ENCOUNTER — Encounter: Payer: Self-pay | Admitting: Obstetrics and Gynecology

## 2022-05-04 ENCOUNTER — Encounter: Payer: 59 | Admitting: Physical Therapy

## 2022-05-04 VITALS — BP 139/91 | HR 83 | Resp 16 | Ht 64.0 in | Wt 270.3 lb

## 2022-05-04 DIAGNOSIS — D259 Leiomyoma of uterus, unspecified: Secondary | ICD-10-CM

## 2022-05-04 DIAGNOSIS — D219 Benign neoplasm of connective and other soft tissue, unspecified: Secondary | ICD-10-CM

## 2022-05-04 NOTE — Telephone Encounter (Signed)
Patient called the office and spoke with [facility] @ 251 and stated she forgot her appt today at 1pm with Ronalee Belts.  Matthias Hughs confirmed her next appt with her for Monday at 230 and patient confirmed.  Jorje Guild

## 2022-05-04 NOTE — Progress Notes (Signed)
HPI:      Ms. Eileen Vargas is a 32 y.o. G0P0000 who LMP was Patient's last menstrual period was 03/05/2022 (exact date).  Subjective:   She presents today stating that she has had regular cycles up until this last month where she is bleeding every 2 to 3 days.  She reports her bleeding is not heavy.  She has a known history of uterine fibroids based on a CT performed 2 years ago.  She reports that she is rarely sexually active at this point.  She would like to discuss what to do about her fibroids and her bleeding.    Hx: The following portions of the patient's history were reviewed and updated as appropriate:             She  has a past medical history of Anemia. She does not have any pertinent problems on file. She  has a past surgical history that includes Cholecystectomy (2013). Her family history includes Clotting disorder in her father; Diabetes in her maternal grandmother; Kidney disease in her mother; Lung cancer in her maternal grandfather; Sickle cell trait in her father. She  reports that she has never smoked. She has never used smokeless tobacco. She reports current alcohol use of about 2.0 standard drinks of alcohol per week. She reports that she does not use drugs. She has a current medication list which includes the following prescription(s): citalopram, cyclobenzaprine, diclofenac, gabapentin, iron sucrose, vitron-c, and omeprazole. She has No Known Allergies.       Review of Systems:  Review of Systems  Constitutional: Denied constitutional symptoms, night sweats, recent illness, fatigue, fever, insomnia and weight loss.  Eyes: Denied eye symptoms, eye pain, photophobia, vision change and visual disturbance.  Ears/Nose/Throat/Neck: Denied ear, nose, throat or neck symptoms, hearing loss, nasal discharge, sinus congestion and sore throat.  Cardiovascular: Denied cardiovascular symptoms, arrhythmia, chest pain/pressure, edema, exercise intolerance, orthopnea and palpitations.   Respiratory: Denied pulmonary symptoms, asthma, pleuritic pain, productive sputum, cough, dyspnea and wheezing.  Gastrointestinal: Denied, gastro-esophageal reflux, melena, nausea and vomiting.  Genitourinary: See HPI for additional information.  Musculoskeletal: Denied musculoskeletal symptoms, stiffness, swelling, muscle weakness and myalgia.  Dermatologic: Denied dermatology symptoms, rash and scar.  Neurologic: Denied neurology symptoms, dizziness, headache, neck pain and syncope.  Psychiatric: Denied psychiatric symptoms, anxiety and depression.  Endocrine: Denied endocrine symptoms including hot flashes and night sweats.   Meds:   Current Outpatient Medications on File Prior to Visit  Medication Sig Dispense Refill   citalopram (CELEXA) 20 MG tablet TAKE 1 TABLET BY MOUTH EVERY DAY 30 tablet 2   cyclobenzaprine (FLEXERIL) 10 MG tablet Take 1 tablet (10 mg total) by mouth 3 (three) times daily as needed for muscle spasms. 30 tablet 1   diclofenac (VOLTAREN) 50 MG EC tablet Take 1 tablet (50 mg total) by mouth 2 (two) times daily. X 2 weeks then twice daily as-needed 60 tablet 1   gabapentin (NEURONTIN) 100 MG capsule Take 1 capsule (100 mg total) by mouth at bedtime. Can increase by 1 capsule nightly every week until symptoms controlled. Do not exceed 3 capsules nightly. 90 capsule 1   Iron Sucrose (VENOFER IV) Inject 200 mg into the vein every 7 (seven) days.     Iron-Vitamin C (VITRON-C) 65-125 MG TABS Take 1 tablet by mouth daily. 90 tablet 1   omeprazole (PRILOSEC) 40 MG capsule TAKE 1 CAPSULE BY MOUTH DAILY AS NEEDED. 90 capsule 1   No current facility-administered medications on file prior to  visit.      Objective:     Vitals:   05/04/22 1447  BP: (!) 139/91  Pulse: 83  Resp: 16   Filed Weights   05/04/22 1447  Weight: 270 lb 5.1 oz (122.6 kg)              CT scan reviewed          Assessment:    G0P0000 Patient Active Problem List   Diagnosis Date Noted    Need for immunization against influenza 03/29/2022   Annual physical exam 08/26/2021   Right lumbosacral radiculopathy 08/12/2021   Fibroid, uterine 08/10/2021   Hypertriglyceridemia 02/08/2021   Anxiety and depression 07/13/2020   Gastroesophageal reflux disease without esophagitis 07/13/2020   Fibroid 07/13/2020   Class 3 severe obesity due to excess calories with body mass index (BMI) of 45.0 to 49.9 in adult ([place]) 07/13/2020   Sickle cell trait ([place]) 07/01/2020   Iron deficiency anemia 06/06/2020     1. Fibroids     Patient is having some irregular bleeding at this time.  Other than this last month she has normal cycles that she says are not too heavy with not too much cramping. Last CT was 2 years ago-largest fibroid 4 cm location given    Plan:            1.  We discussed multiple options including cycle control using OCPs and IUD.  We have discussed UFE, use of Myfembree and hysterectomy.  All of her questions were answered.  At this time we will use expectant management over the next month.  If her cycles returned and are normal nothing further to do.  If she continues to experience irregular bleeding I would consider ultrasound followed by discussion of cycle control options.  Would also consider a short course of progesterone for 1 cycle to control bleeding and reset the endometrium. She will contact us if she continues to experience issues with her fibroids. Orders No orders of the defined types were placed in this encounter.   No orders of the defined types were placed in this encounter.     F/U  Return for Pt to contact us if symptoms worsen. I spent 31 minutes involved in the care of this patient preparing to see the patient by obtaining and reviewing her medical history (including labs, imaging tests and prior procedures), documenting clinical information in the electronic health record (EHR), counseling and coordinating care plans, writing and sending prescriptions,  ordering tests or procedures and in direct communicating with the patient and medical staff discussing pertinent items from her history and physical exam.  Finis Bud, M.D. 05/04/2022 3:08 PM

## 2022-05-06 ENCOUNTER — Other Ambulatory Visit: Payer: Self-pay | Admitting: Family Medicine

## 2022-05-06 DIAGNOSIS — M5417 Radiculopathy, lumbosacral region: Secondary | ICD-10-CM

## 2022-05-06 NOTE — Telephone Encounter (Signed)
Requested Prescriptions  Pending Prescriptions Disp Refills   diclofenac (VOLTAREN) 50 MG EC tablet [Pharmacy Med Name: DICLOFENAC SOD EC 50 MG TAB] 180 tablet 0    Sig: TAKE 1 TABLET (50 MG TOTAL) BY MOUTH 2 (TWO) TIMES DAILY. X 2 WEEKS THEN TWICE DAILY AS-NEEDED     Analgesics:  NSAIDS Failed - 05/06/2022  1:50 AM      Failed - Manual Review: Labs are only required if the patient has taken medication for more than 8 weeks.      Failed - HGB in normal range and within 360 days    Hemoglobin  Date Value Ref Range Status  02/15/2022 11.2 (L) 12.0 - 15.0 g/dL Final  08/27/2021 10.8 (L) 11.1 - 15.9 g/dL Final         Failed - HCT in normal range and within 360 days    HCT  Date Value Ref Range Status  02/15/2022 34.5 (L) 36.0 - 46.0 % Final   Hematocrit  Date Value Ref Range Status  08/27/2021 34.9 34.0 - 46.6 % Final         Passed - Cr in normal range and within 360 days    Creatinine, Ser  Date Value Ref Range Status  08/27/2021 0.81 0.57 - 1.00 mg/dL Final         Passed - PLT in normal range and within 360 days    Platelets  Date Value Ref Range Status  02/15/2022 289 150 - 400 K/uL Final  08/27/2021 318 150 - 450 x10E3/uL Final         Passed - eGFR is 30 or above and within 360 days    GFR, Estimated  Date Value Ref Range Status  05/29/2020 >60 >60 mL/min Final    Comment:    (NOTE) Calculated using the CKD-EPI Creatinine Equation (2021)    eGFR  Date Value Ref Range Status  08/27/2021 99 >59 mL/min/1.73 Final         Passed - Patient is not pregnant      Passed - Valid encounter within last 12 months    Recent Outpatient Visits           1 month ago Right lumbosacral radiculopathy   [facility] [place], Earley Abide, MD   8 months ago Annual physical exam   [facility] [place], Earley Abide, MD   8 months ago Right lumbosacral radiculopathy   [facility] [place], Earley Abide, MD   1 year ago Gastroesophageal reflux disease without esophagitis   [facility] [place], Earley Abide, MD   1 year ago Gastroesophageal reflux disease without esophagitis   [facility], Earley Abide, MD       Future Appointments             In 6 days Zigmund Daniel, Earley Abide, MD [facility] at [facility], [facility]   In 3 months Zigmund Daniel, Earley Abide, MD [facility] at [facility], [facility]

## 2022-05-09 ENCOUNTER — Ambulatory Visit: Payer: 59 | Admitting: Physical Therapy

## 2022-05-09 ENCOUNTER — Encounter: Payer: Self-pay | Admitting: Physical Therapy

## 2022-05-09 DIAGNOSIS — M6281 Muscle weakness (generalized): Secondary | ICD-10-CM | POA: Diagnosis not present

## 2022-05-09 DIAGNOSIS — M2560 Stiffness of unspecified joint, not elsewhere classified: Secondary | ICD-10-CM | POA: Diagnosis not present

## 2022-05-09 DIAGNOSIS — M5417 Radiculopathy, lumbosacral region: Secondary | ICD-10-CM | POA: Diagnosis not present

## 2022-05-09 NOTE — Therapy (Signed)
OUTPATIENT PHYSICAL THERAPY THORACOLUMBAR TREATMENT  Patient Name: Eileen Vargas MRN: [id number] DOB:[birth year], 32 y.o., female Today's Date: 05/09/2022  END OF SESSION:  PT End of Session - 05/09/22 1428     Visit Number 4    Number of Visits 9    Date for PT Re-Evaluation 05/10/22    PT Start Time 4403    PT Stop Time 1518    PT Time Calculation (min) 50 min             Past Medical History:  Diagnosis Date   Anemia    Past Surgical History:  Procedure Laterality Date   CHOLECYSTECTOMY  2013   Patient Active Problem List   Diagnosis Date Noted   Need for immunization against influenza 03/29/2022   Annual physical exam 08/26/2021   Right lumbosacral radiculopathy 08/12/2021   Fibroid, uterine 08/10/2021   Hypertriglyceridemia 02/08/2021   Anxiety and depression 07/13/2020   Gastroesophageal reflux disease without esophagitis 07/13/2020   Fibroid 07/13/2020   Class 3 severe obesity due to excess calories with body mass index (BMI) of 45.0 to 49.9 in adult ([place]) 07/13/2020   Sickle cell trait ([place]) 07/01/2020   Iron deficiency anemia 06/06/2020    PCP: Montel Culver, MD  REFERRING PROVIDER: Montel Culver, MD  REFERRING DIAG: Right lumbosacral radiculopathy  Rationale for Evaluation and Treatment: Rehabilitation  THERAPY DIAG:  Right lumbosacral radiculopathy  Joint stiffness  Muscle weakness (generalized)  ONSET DATE: 04/2020  SUBJECTIVE:                                                                                                                                                                                           SUBJECTIVE STATEMENT:  EVALUATION Pt. Reports chronic h/o low back pain with R LE radicular symptoms into R thigh/ calf.  Pt. Reports she had an acute flare-up of chronic low back issues with prolonged standing 2 weeks ago.  No pain in seated posture.  Pt. Works from home at [employer].  Pt. C/o 9/10 R low back with shopping 1-2  hours.  Pt. Taking Gabapentin at night.  Pt. States pain worsens t/o the day.    PERTINENT HISTORY:   Chief Complaint  Patient presents with   Right lumbosacral radiculopathy      For about 5 days, States it hurts worst with walking and better when sitting.          Vitals:    03/29/22 1529 03/29/22 1533  BP: (!) 134/90 (!) 136/90  Pulse: 76    SpO2: 99%         Vitals:    03/29/22 1529  Weight: 266 lb (120.7 kg)  Height: '5\' 4"'$  (1.626 m)    Body mass index is 45.66 kg/m.   Imaging Results  No results found.      Independent interpretation of notes and tests performed by another provider:    Independent interpretation of lumbar x-rays dated 08/26/21 reveal straightening of thoracolumbar expected curvature, subtle listhesis of L5 on S1 with subtle osteophyte formation, no acute processes.   Procedures performed:    None   Pertinent History, Exam, Impression, and Recommendations:    Problem List Items Addressed This Visit              Nervous and Auditory    Right lumbosacral radiculopathy - Primary      Recurrent lumbosacral pain with right-sided radiation, atraumatic onset x 5 days, worse with extension, ADLs. Examination with +SLR right, +Kemp's localizing right, tightness throughout deep hip and core structures. Given x-ray and exam findings, plan for medication management, formal PT, follow-up in 6 days.        Relevant Medications    gabapentin (NEURONTIN) 100 MG capsule    diclofenac (VOLTAREN) 50 MG EC tablet    cyclobenzaprine (FLEXERIL) 10 MG tablet   PAIN:  Are you having pain? Yes: NPRS scale: 0/10 Pain location: Low back Pain description: Aching Aggravating factors: Prolonged standing/ shopping Relieving factors: Sitting  PRECAUTIONS: None  WEIGHT BEARING RESTRICTIONS: No  FALLS:  Has patient fallen in last 6 months? No  LIVING ENVIRONMENT: Lives with: lives with their partner Lives in: House/apartment Has following equipment at home:  None  OCCUPATION: Works from home/ sedentary job  PLOF: Independent  PATIENT GOALS: Decrease low back pain/ radicular symptoms.   NEXT MD VISIT:  PRN   OBJECTIVE:   DIAGNOSTIC FINDINGS:  FINDINGS: There are 5 non-rib-bearing lumbar vertebra with unfused transverse process apophyses at L1. normal alignment. Normal vertebral body heights. Minor L5-S1 anterior spurring. The remaining disc spaces are preserved. No evidence of fracture, focal bone abnormality or pars defects. Cholecystectomy clips in the right upper quadrant. Left lower pole renal stone, seen on 05/29/2020 abdominal CT.   IMPRESSION: Minimal spondylosis with spurring at L5-S1.     Electronically Signed   By: Keith Rake M.D.   On: 08/27/2021 16:06  PATIENT SURVEYS:  FOTO initial 49/ goal 71  SCREENING FOR RED FLAGS: Bowel or bladder incontinence: No Spinal tumors: No Cauda equina syndrome: No Compression fracture: No Abdominal aneurysm: No  COGNITION: Overall cognitive status: Within functional limits for tasks assessed     SENSATION: WFL  MUSCLE LENGTH: Hamstrings: Right 65 deg; Left 70 deg Thomas test: N/T  POSTURE: No Significant postural limitations  PALPATION: Hypomobility noted in lumbar spine with unilateral/ central PA mobs.  No tenderness noted.   LUMBAR ROM:   AROM eval  Flexion 25% limited  Extension 50% limited  Right lateral flexion WFL  Left lateral flexion WFL  Right rotation WFL  Left rotation WFL   (Blank rows = not tested)  No change in symptoms with repeated movement/ extension  LOWER EXTREMITY ROM:     B LE AROM WFL.  Good symmetry between L and R.  LOWER EXTREMITY MMT:    MMT Right eval Left eval  Hip flexion 5 5  Hip extension    Hip abduction 4+ 4+  Hip adduction    Hip internal rotation 5 5  Hip external rotation 5 5  Knee flexion 5 5  Knee extension 5 5  Ankle dorsiflexion 5 5  Ankle plantarflexion    Ankle inversion    Ankle eversion      (Blank rows = not tested)  LUMBAR SPECIAL TESTS:  Straight leg raise test: Positive, FABER test: Negative, and Gaenslen's test: Negative  FUNCTIONAL TESTS:  TBD  GAIT: Distance walked: in clinic Assistive device utilized: None Level of assistance: Complete Independence Comments: normalized gait  TODAY'S TREATMENT:                                                                                                                              DATE: 05/09/22   Subjective:  Pt. Entered PT with no c/o low back pain or radicular symptoms.  Pt. Missed PT last week secondary to work schedule.  MD f/u with Dr. Zigmund Daniel on 05/12/22.    There.ex.:  Supine L hamstring: 76 deg. (Improvement noted),  R hamstring 65 deg. (No pain).    Reassessment of lumbar AROM (no issues).  No pain with flexion/ extension or rotn. overpressure.    TrA contraction/ marching/ dead bug/ bicycle kicks/ bridging/ hip abduction with BTB/ hip flexion BTB/ bridging 10x each (good technique).  Reviewed HEP.  No pain during bridging with added hip marching.     Manual tx:  Supine LE/lumbar generalized stretches with focus on knee to chest/ piriformis/ hamstring with added neural glides on R LE.  No increase c/o symptoms.   Prone Grade II-III PA mobs. To low thoracic and lumbar spine (central and unilateral)- 3x20 sec.  No c/o tenderness reported.   Prone STM to lumbar paraspinals (added use of Hypervolt).   MH to lumbar spine (varying places during [employer]).     PATIENT EDUCATION:  Education details: Access Code: [email or web address] Person educated: Patient Education method: Explanation, Demonstration, and Handouts Education comprehension: verbalized understanding and returned demonstration  HOME EXERCISE PROGRAM: Access Code: [email or web address] URL: [email or web address] Date: 04/12/2022 Prepared by: Dorcas Carrow  Exercises - Supine Lower Trunk Rotation  - 2 x daily - 7 x weekly - 1 sets - 3 reps - Hooklying Single Knee  to Chest Stretch  - 2 x daily - 7 x weekly - 1 sets - 3 reps - Seated Hamstring Stretch  - 2 x daily - 7 x weekly - 1 sets - 3 reps - 20 seconds hold - Standing Hamstring Stretch with Step  - 2 x daily - 7 x weekly - 1 sets - 3 reps - 20 seconds hold - Standing Lumbar Extension  - 2 x daily - 7 x weekly - 1 sets - 5 reps  Access Code: [email or web address] URL: [email or web address] Date: 04/27/2022 Prepared by: Dorcas Carrow  Exercises - Supine Lower Trunk Rotation  - 2 x daily - 7 x weekly - 1 sets - 3 reps - Hooklying Single Knee to Chest Stretch  - 2 x daily - 7 x weekly - 1 sets - 3 reps - Seated Hamstring Stretch  - 2 x daily - 7 x weekly - 1 sets -  3 reps - 20 seconds hold - Standing Hamstring Stretch with Step  - 2 x daily - 7 x weekly - 1 sets - 3 reps - 20 seconds hold - Standing Lumbar Extension  - 2 x daily - 7 x weekly - 1 sets - 5 reps - Supine Posterior Pelvic Tilt  - 1 x daily - 7 x weekly - 1 sets - 10 reps - Dead Bug  - 1 x daily - 7 x weekly - 1 sets - 20 reps - Supine Bridge  - 1 x daily - 7 x weekly - 1 sets - 10 reps  ASSESSMENT:  CLINICAL IMPRESSION: Pt. presents with no pain in back currently at rest and demonstrates good understanding of HEP.  Pt. Presents with good lumbar AROM (all planes of movement) with no radicular symptoms today.  Moderate lumbar hypomobility with PA mobs. To low lumbar spine.   Pt. Will benefit from short-term skilled PT services to increase generalized flexibility/ strength to improve pain-free mobility.    OBJECTIVE IMPAIRMENTS: decreased activity tolerance, decreased endurance, decreased mobility, decreased ROM, decreased strength, hypomobility, impaired flexibility, improper body mechanics, postural dysfunction, obesity, and pain.   ACTIVITY LIMITATIONS: carrying, lifting, bending, sitting, standing, squatting, transfers, bed mobility, and locomotion level  PARTICIPATION LIMITATIONS: cleaning, laundry, driving, shopping, community  activity, and occupation  PERSONAL FACTORS: Age and Past/current experiences are also affecting patient's functional outcome.   REHAB POTENTIAL: Good  CLINICAL DECISION MAKING: Stable/uncomplicated  EVALUATION COMPLEXITY: Low   GOALS: Goals reviewed with patient? Yes  SHORT TERM GOALS: Target date: 04/26/22  Pt. Independent with HEP to increase lumbar ROM/ core stability to improve pain-free mobility.  Baseline:  see above Goal status: Goal met   LONG TERM GOALS: Target date: 05/10/22  Pt. Will increase FOTO to 71 to improve pain-free mobility.   Baseline:  initial FOTO 49 Goal status: INITIAL  2.  Pt. Will report no low back pain with rolling over/ bed mobility to improve pain-free mobility.  Baseline: increase in R glut/ low back symptoms getting off mat table Goal status: INITIAL  3.  Pt. Will report no increase in low back pain/ R LE radicular symptoms with daily household chores/ work.   Baseline: See above Goal status: INITIAL   PLAN:  PT FREQUENCY: 1-2x/week  PT DURATION: 4 weeks  PLANNED INTERVENTIONS: Therapeutic exercises, Therapeutic activity, Neuromuscular re-education, Patient/Family education, Self Care, Joint mobilization, Joint manipulation, Dry Needling, Electrical stimulation, Spinal manipulation, Spinal mobilization, Cryotherapy, Moist heat, Manual therapy, and Re-evaluation.  PLAN FOR NEXT SESSION:  SEND MD PROGRESS NOTE  Pura Spice, PT, DPT # [phone] 05/09/2022, 4:28 PM

## 2022-05-11 ENCOUNTER — Encounter: Payer: Self-pay | Admitting: Physical Therapy

## 2022-05-11 ENCOUNTER — Ambulatory Visit: Payer: 59 | Admitting: Physical Therapy

## 2022-05-11 DIAGNOSIS — M6281 Muscle weakness (generalized): Secondary | ICD-10-CM

## 2022-05-11 DIAGNOSIS — M2560 Stiffness of unspecified joint, not elsewhere classified: Secondary | ICD-10-CM

## 2022-05-11 DIAGNOSIS — M5417 Radiculopathy, lumbosacral region: Secondary | ICD-10-CM | POA: Diagnosis not present

## 2022-05-11 NOTE — Therapy (Addendum)
OUTPATIENT PHYSICAL THERAPY THORACOLUMBAR TREATMENT/RECERTIFICATION  Patient Name: Eileen Vargas MRN: [id number] DOB:1990-11-16, 32 y.o., female Today's Date: 05/11/2022  END OF SESSION:  PT End of Session - 05/11/22 1441     Visit Number 5    Number of Visits 9    Date for PT Re-Evaluation 06/08/22    PT Start Time 1344    PT Stop Time 1433    PT Time Calculation (min) 49 min             Past Medical History:  Diagnosis Date   Anemia    Past Surgical History:  Procedure Laterality Date   CHOLECYSTECTOMY  2013   Patient Active Problem List   Diagnosis Date Noted   Need for immunization against influenza 03/29/2022   Annual physical exam 08/26/2021   Right lumbosacral radiculopathy 08/12/2021   Fibroid, uterine 08/10/2021   Hypertriglyceridemia 02/08/2021   Anxiety and depression 07/13/2020   Gastroesophageal reflux disease without esophagitis 07/13/2020   Fibroid 07/13/2020   Class 3 severe obesity due to excess calories with body mass index (BMI) of 45.0 to 49.9 in adult ([place]) 07/13/2020   Sickle cell trait ([place]) 07/01/2020   Iron deficiency anemia 06/06/2020    PCP: Montel Culver, MD  REFERRING PROVIDER: Montel Culver, MD  REFERRING DIAG: Right lumbosacral radiculopathy  Rationale for Evaluation and Treatment: Rehabilitation  THERAPY DIAG:  Right lumbosacral radiculopathy  Joint stiffness  Muscle weakness (generalized)  ONSET DATE: 04/2020  SUBJECTIVE:                                                                                                                                                                                           SUBJECTIVE STATEMENT:  EVALUATION Pt. Reports chronic h/o low back pain with R LE radicular symptoms into R thigh/ calf.  Pt. Reports she had an acute flare-up of chronic low back issues with prolonged standing 2 weeks ago.  No pain in seated posture.  Pt. Works from home at [employer].  Pt. C/o 9/10 R low back  with shopping 1-2 hours.  Pt. Taking Gabapentin at night.  Pt. States pain worsens t/o the day.    PERTINENT HISTORY:   Chief Complaint  Patient presents with   Right lumbosacral radiculopathy      For about 5 days, States it hurts worst with walking and better when sitting.          Vitals:    03/29/22 1529 03/29/22 1533  BP: (!) 134/90 (!) 136/90  Pulse: 76    SpO2: 99%         Vitals:    03/29/22 1529  Weight: 266 lb (120.7 kg)  Height: '5\' 4"'$  (1.626 m)    Body mass index is 45.66 kg/m.   Imaging Results  No results found.      Independent interpretation of notes and tests performed by another provider:    Independent interpretation of lumbar x-rays dated 08/26/21 reveal straightening of thoracolumbar expected curvature, subtle listhesis of L5 on S1 with subtle osteophyte formation, no acute processes.   Procedures performed:    None   Pertinent History, Exam, Impression, and Recommendations:    Problem List Items Addressed This Visit              Nervous and Auditory    Right lumbosacral radiculopathy - Primary      Recurrent lumbosacral pain with right-sided radiation, atraumatic onset x 5 days, worse with extension, ADLs. Examination with +SLR right, +Kemp's localizing right, tightness throughout deep hip and core structures. Given x-ray and exam findings, plan for medication management, formal PT, follow-up in 6 days.        Relevant Medications    gabapentin (NEURONTIN) 100 MG capsule    diclofenac (VOLTAREN) 50 MG EC tablet    cyclobenzaprine (FLEXERIL) 10 MG tablet   PAIN:  Are you having pain? Yes: NPRS scale: 0/10 Pain location: Low back Pain description: Aching Aggravating factors: Prolonged standing/ shopping Relieving factors: Sitting  PRECAUTIONS: None  WEIGHT BEARING RESTRICTIONS: No  FALLS:  Has patient fallen in last 6 months? No  LIVING ENVIRONMENT: Lives with: lives with their partner Lives in: House/apartment Has following  equipment at home: None  OCCUPATION: Works from home/ sedentary job  PLOF: Independent  PATIENT GOALS: Decrease low back pain/ radicular symptoms.   NEXT MD VISIT:  PRN   OBJECTIVE:   DIAGNOSTIC FINDINGS:  FINDINGS: There are 5 non-rib-bearing lumbar vertebra with unfused transverse process apophyses at L1. normal alignment. Normal vertebral body heights. Minor L5-S1 anterior spurring. The remaining disc spaces are preserved. No evidence of fracture, focal bone abnormality or pars defects. Cholecystectomy clips in the right upper quadrant. Left lower pole renal stone, seen on 05/29/2020 abdominal CT.   IMPRESSION: Minimal spondylosis with spurring at L5-S1.     Electronically Signed   By: Keith Rake M.D.   On: 08/27/2021 16:06  PATIENT SURVEYS:  FOTO initial 49/ goal 71  SCREENING FOR RED FLAGS: Bowel or bladder incontinence: No Spinal tumors: No Cauda equina syndrome: No Compression fracture: No Abdominal aneurysm: No  COGNITION: Overall cognitive status: Within functional limits for tasks assessed     SENSATION: WFL  MUSCLE LENGTH: Hamstrings: Right 65 deg; Left 70 deg Thomas test: N/T  POSTURE: No Significant postural limitations  PALPATION: Hypomobility noted in lumbar spine with unilateral/ central PA mobs.  No tenderness noted.   LUMBAR ROM:   AROM eval  Flexion 25% limited  Extension 50% limited  Right lateral flexion WFL  Left lateral flexion WFL  Right rotation WFL  Left rotation WFL   (Blank rows = not tested)  No change in symptoms with repeated movement/ extension  LOWER EXTREMITY ROM:     B LE AROM WFL.  Good symmetry between L and R.  LOWER EXTREMITY MMT:    MMT Right eval Left eval  Hip flexion 5 5  Hip extension    Hip abduction 4+ 4+  Hip adduction    Hip internal rotation 5 5  Hip external rotation 5 5  Knee flexion 5 5  Knee extension 5 5  Ankle dorsiflexion 5 5  Ankle plantarflexion    Ankle inversion     Ankle eversion     (Blank rows = not tested)  LUMBAR SPECIAL TESTS:  Straight leg raise test: Positive, FABER test: Negative, and Gaenslen's test: Negative  FUNCTIONAL TESTS:  TBD  GAIT: Distance walked: in clinic Assistive device utilized: None Level of assistance: Complete Independence Comments: normalized gait  1/15: Supine L hamstring: 76 deg. (Improvement noted),  R hamstring 65 deg. (No pain).   TODAY'S TREATMENT:                                                                                                                              DATE: 05/11/22   Subjective:  Pt. Entered PT with no c/o low back pain or radicular symptoms.  Pt. Has f/u appt. with Dr. Zigmund Daniel on 05/12/22.    There.ex.:   Seated on blue ball at //-bars (mirror feedback): LAQ/ marching/ 2# ex.  TrA contraction/ marching/ dead bug/ bicycle kicks/ bridging/ hip abduction with BTB/ hip flexion BTB/ bridging 10x each (good technique).  Reviewed HEP.  No pain during bridging with added hip marching.     Manual tx:  Supine LE/lumbar generalized stretches with focus on knee to chest/ piriformis/ hamstring with added neural glides on R LE.  No increase c/o symptoms.   Prone Grade II-III PA mobs. To low thoracic and lumbar spine (central and unilateral)- 3x20 sec.  No c/o tenderness reported.   Prone STM to lumbar paraspinals (added use of Hypervolt).   MH to lumbar spine (varying places during [facility]).     PATIENT EDUCATION:  Education details: Access Code: [email or web address] Person educated: Patient Education method: Explanation, Demonstration, and Handouts Education comprehension: verbalized understanding and returned demonstration  HOME EXERCISE PROGRAM: Access Code: [email or web address] URL: [email or web address] Date: 04/12/2022 Prepared by: Dorcas Carrow  Exercises - Supine Lower Trunk Rotation  - 2 x daily - 7 x weekly - 1 sets - 3 reps - Hooklying Single Knee to Chest Stretch  - 2 x daily - 7 x  weekly - 1 sets - 3 reps - Seated Hamstring Stretch  - 2 x daily - 7 x weekly - 1 sets - 3 reps - 20 seconds hold - Standing Hamstring Stretch with Step  - 2 x daily - 7 x weekly - 1 sets - 3 reps - 20 seconds hold - Standing Lumbar Extension  - 2 x daily - 7 x weekly - 1 sets - 5 reps  Access Code: [email or web address] URL: [email or web address] Date: 04/27/2022 Prepared by: Dorcas Carrow  Exercises - Supine Lower Trunk Rotation  - 2 x daily - 7 x weekly - 1 sets - 3 reps - Hooklying Single Knee to Chest Stretch  - 2 x daily - 7 x weekly - 1 sets - 3 reps - Seated Hamstring Stretch  - 2 x daily - 7 x weekly - 1 sets - 3 reps - 20 seconds hold - Standing Hamstring Stretch with  Step  - 2 x daily - 7 x weekly - 1 sets - 3 reps - 20 seconds hold - Standing Lumbar Extension  - 2 x daily - 7 x weekly - 1 sets - 5 reps - Supine Posterior Pelvic Tilt  - 1 x daily - 7 x weekly - 1 sets - 10 reps - Dead Bug  - 1 x daily - 7 x weekly - 1 sets - 20 reps - Supine Bridge  - 1 x daily - 7 x weekly - 1 sets - 10 reps  ASSESSMENT:  CLINICAL IMPRESSION: Pt. presents with no pain in back currently at rest and demonstrates good understanding of HEP.  Pt. Presents with good lumbar AROM (all planes of movement) with no radicular symptoms today.  Moderate lumbar hypomobility with PA mobs. To low lumbar spine.   Probable discharge from PT at this time with focus on an independent HEP.  Pt. Returns to MD for f/u to discuss status.  All goals met.  OBJECTIVE IMPAIRMENTS: decreased activity tolerance, decreased endurance, decreased mobility, decreased ROM, decreased strength, hypomobility, impaired flexibility, improper body mechanics, postural dysfunction, obesity, and pain.   ACTIVITY LIMITATIONS: carrying, lifting, bending, sitting, standing, squatting, transfers, bed mobility, and locomotion level  PARTICIPATION LIMITATIONS: cleaning, laundry, driving, shopping, community activity, and  occupation  PERSONAL FACTORS: Age and Past/current experiences are also affecting patient's functional outcome.   REHAB POTENTIAL: Good  CLINICAL DECISION MAKING: Stable/uncomplicated  EVALUATION COMPLEXITY: Low   GOALS: Goals reviewed with patient? Yes  SHORT TERM GOALS: Target date: 04/26/22  Pt. Independent with HEP to increase lumbar ROM/ core stability to improve pain-free mobility.  Baseline:  see above Goal status: Goal met   LONG TERM GOALS: Target date: 05/10/22  Pt. Will increase FOTO to 71 to improve pain-free mobility.   Baseline:  initial FOTO 49.  1/17: 82 goal met Goal status: Goal met  2.  Pt. Will report no low back pain with rolling over/ bed mobility to improve pain-free mobility.  Baseline: increase in R glut/ low back symptoms getting off mat table Goal status: Goal met  3.  Pt. Will report no increase in low back pain/ R LE radicular symptoms with daily household chores/ work.   Baseline: See above Goal status: Partially met   PLAN:  PT FREQUENCY: 1-2x/week  PT DURATION: 4 weeks  PLANNED INTERVENTIONS: Therapeutic exercises, Therapeutic activity, Neuromuscular re-education, Patient/Family education, Self Care, Joint mobilization, Joint manipulation, Dry Needling, Electrical stimulation, Spinal manipulation, Spinal mobilization, Cryotherapy, Moist heat, Manual therapy, and Re-evaluation.  PLAN FOR NEXT SESSION:  Discuss MD f/u.  Possible discharge if no issues.    Pura Spice, PT, DPT # [phone] 05/12/2022, 7:59 AM

## 2022-05-12 ENCOUNTER — Ambulatory Visit: Payer: 59 | Admitting: Family Medicine

## 2022-05-12 ENCOUNTER — Encounter: Payer: Self-pay | Admitting: Family Medicine

## 2022-05-12 VITALS — BP 128/78 | HR 98 | Ht 64.0 in | Wt 268.0 lb

## 2022-05-12 DIAGNOSIS — M5417 Radiculopathy, lumbosacral region: Secondary | ICD-10-CM

## 2022-05-12 NOTE — Progress Notes (Signed)
     Primary Care / Sports Medicine Office Visit  Patient Information:  Patient ID: Eileen Vargas, female DOB: 11/08/1990 Age: 32 y.o. MRN: [id number]   Eileen Vargas is a pleasant 32 y.o. female presenting with the following:  Chief Complaint  Patient presents with   Right lumbosacral radiculopathy    Feeling better    Vitals:   05/12/22 1511  BP: 128/78  Pulse: 98  SpO2: 97%   Vitals:   05/12/22 1511  Weight: 268 lb (121.6 kg)  Height: '5\' 4"'$  (1.626 m)   Body mass index is 46 kg/m.  No results found.   Independent interpretation of notes and tests performed by another provider:   None  Procedures performed:   None  Pertinent History, Exam, Impression, and Recommendations:   Eileen Vargas was seen today for right lumbosacral radiculopathy.  Right lumbosacral radiculopathy Assessment & Plan: Acute on chronic condition, still symptomatic, with interval improvement.  At last visit she was advised meloxicam, cyclobenzaprine, gabapentin, formal PT.  She is compliant with regimen and has noted roughly 80-85% improvement.  Examination today with negative seated straight leg raise, intact sensorimotor bilateral lower extremities, minimally tender at the sacroiliac region right greater than left, nontender at the greater trochanteric regions bilaterally.  Patient is demonstrated interval improvement and given her persistent symptomatology I have advised continued gabapentin 100 mg nightly dosing, transition to as needed diclofenac and continue as needed cyclobenzaprine.  She is to continue with physical therapy and home exercises with follow-up in 2 months scheduled for reevaluation.  If suboptimal progress noted advanced imaging to be considered, local injections if clinically indicated.      Orders & Medications No orders of the defined types were placed in this encounter.  No orders of the defined types were placed in this encounter.    Return in about 2 months (around  07/11/2022) for f/u low back.     Montel Culver, MD, [facility]   Primary Care Sports Medicine Primary Care and Sports Medicine at [facility]

## 2022-05-12 NOTE — Patient Instructions (Addendum)
-  Continue nightly gabapentin - Can transition to as-needed dosing of diclofenac - Continue as-needed dosing of cyclobenzaprine - Continue PT  - Return in 2 months

## 2022-05-12 NOTE — Assessment & Plan Note (Signed)
Acute on chronic condition, still symptomatic, with interval improvement.  At last visit she was advised meloxicam, cyclobenzaprine, gabapentin, formal PT.  She is compliant with regimen and has noted roughly 80-85% improvement.  Examination today with negative seated straight leg raise, intact sensorimotor bilateral lower extremities, minimally tender at the sacroiliac region right greater than left, nontender at the greater trochanteric regions bilaterally.  Patient is demonstrated interval improvement and given her persistent symptomatology I have advised continued gabapentin 100 mg nightly dosing, transition to as needed diclofenac and continue as needed cyclobenzaprine.  She is to continue with physical therapy and home exercises with follow-up in 2 months scheduled for reevaluation.  If suboptimal progress noted advanced imaging to be considered, local injections if clinically indicated.

## 2022-05-16 ENCOUNTER — Ambulatory Visit: Payer: 59 | Admitting: Physical Therapy

## 2022-05-18 ENCOUNTER — Ambulatory Visit: Payer: 59 | Admitting: Physical Therapy

## 2022-05-18 ENCOUNTER — Inpatient Hospital Stay: Payer: 59 | Attending: Nurse Practitioner

## 2022-05-18 DIAGNOSIS — Z801 Family history of malignant neoplasm of trachea, bronchus and lung: Secondary | ICD-10-CM | POA: Diagnosis not present

## 2022-05-18 DIAGNOSIS — M2560 Stiffness of unspecified joint, not elsewhere classified: Secondary | ICD-10-CM

## 2022-05-18 DIAGNOSIS — N926 Irregular menstruation, unspecified: Secondary | ICD-10-CM | POA: Insufficient documentation

## 2022-05-18 DIAGNOSIS — D500 Iron deficiency anemia secondary to blood loss (chronic): Secondary | ICD-10-CM

## 2022-05-18 DIAGNOSIS — M6281 Muscle weakness (generalized): Secondary | ICD-10-CM | POA: Diagnosis not present

## 2022-05-18 DIAGNOSIS — M5417 Radiculopathy, lumbosacral region: Secondary | ICD-10-CM | POA: Diagnosis not present

## 2022-05-18 DIAGNOSIS — D573 Sickle-cell trait: Secondary | ICD-10-CM | POA: Insufficient documentation

## 2022-05-18 LAB — CBC
HCT: 34.1 % — ABNORMAL LOW (ref 36.0–46.0)
Hemoglobin: 11.1 g/dL — ABNORMAL LOW (ref 12.0–15.0)
MCH: 28.5 pg (ref 26.0–34.0)
MCHC: 32.6 g/dL (ref 30.0–36.0)
MCV: 87.4 fL (ref 80.0–100.0)
Platelets: 270 10*3/uL (ref 150–400)
RBC: 3.9 MIL/uL (ref 3.87–5.11)
RDW: 15.4 % (ref 11.5–15.5)
WBC: 5.5 10*3/uL (ref 4.0–10.5)
nRBC: 0 % (ref 0.0–0.2)

## 2022-05-18 LAB — FERRITIN: Ferritin: 75 ng/mL (ref 11–307)

## 2022-05-18 LAB — IRON AND TIBC
Iron: 57 ug/dL (ref 28–170)
Saturation Ratios: 17 % (ref 10.4–31.8)
TIBC: 337 ug/dL (ref 250–450)
UIBC: 280 ug/dL

## 2022-05-18 NOTE — Therapy (Unsigned)
OUTPATIENT PHYSICAL THERAPY THORACOLUMBAR TREATMENT Patient Name: Eileen Vargas MRN: [id number] DOB:[birth year], 32 y.o., female Today's Date: 05/11/2022  END OF SESSION:  PT End of Session - 05/18/22 1300     Visit Number 6    Number of Visits 9    Date for PT Re-Evaluation 06/08/22    PT Start Time 1300    PT Stop Time 8413    PT Time Calculation (min) 49 min    Activity Tolerance Patient tolerated treatment well    Behavior During Therapy [facility] for tasks assessed/performed             Past Medical History:  Diagnosis Date   Anemia    Past Surgical History:  Procedure Laterality Date   CHOLECYSTECTOMY  2013   Patient Active Problem List   Diagnosis Date Noted   Need for immunization against influenza 03/29/2022   Annual physical exam 08/26/2021   Right lumbosacral radiculopathy 08/12/2021   Fibroid, uterine 08/10/2021   Hypertriglyceridemia 02/08/2021   Anxiety and depression 07/13/2020   Gastroesophageal reflux disease without esophagitis 07/13/2020   Fibroid 07/13/2020   Class 3 severe obesity due to excess calories with body mass index (BMI) of 45.0 to 49.9 in adult ([place] [place] [address]) 07/13/2020   Sickle cell trait ([place]) 07/01/2020   Iron deficiency anemia 06/06/2020    PCP: Montel Culver, MD  REFERRING PROVIDER: Montel Culver, MD  REFERRING DIAG: Right lumbosacral radiculopathy  Rationale for Evaluation and Treatment: Rehabilitation  THERAPY DIAG:  Right lumbosacral radiculopathy  Joint stiffness  Muscle weakness (generalized)  ONSET DATE: 04/2020  SUBJECTIVE:                                                                                                                                                                                           SUBJECTIVE STATEMENT:  EVALUATION Pt. Reports chronic h/o low back pain with R LE radicular symptoms into R thigh/ calf.  Pt. Reports she had an acute flare-up of chronic low back issues with prolonged standing 2  weeks ago.  No pain in seated posture.  Pt. Works from home at [employer].  Pt. C/o 9/10 R low back with shopping 1-2 hours.  Pt. Taking Gabapentin at night.  Pt. States pain worsens t/o the day.    PERTINENT HISTORY:   Chief Complaint  Patient presents with   Right lumbosacral radiculopathy      For about 5 days, States it hurts worst with walking and better when sitting.          Vitals:    03/29/22 1529 03/29/22 1533  BP: (!) 134/90 (!) 136/90  Pulse: 76  SpO2: 99%         Vitals:    03/29/22 1529  Weight: 266 lb (120.7 kg)  Height: '5\' 4"'$  (1.626 m)    Body mass index is 45.66 kg/m.   Imaging Results  No results found.      Independent interpretation of notes and tests performed by another provider:    Independent interpretation of lumbar x-rays dated 08/26/21 reveal straightening of thoracolumbar expected curvature, subtle listhesis of L5 on S1 with subtle osteophyte formation, no acute processes.   Procedures performed:    None   Pertinent History, Exam, Impression, and Recommendations:    Problem List Items Addressed This Visit              Nervous and Auditory    Right lumbosacral radiculopathy - Primary      Recurrent lumbosacral pain with right-sided radiation, atraumatic onset x 5 days, worse with extension, ADLs. Examination with +SLR right, +Kemp's localizing right, tightness throughout deep hip and core structures. Given x-ray and exam findings, plan for medication management, formal PT, follow-up in 6 days.        Relevant Medications    gabapentin (NEURONTIN) 100 MG capsule    diclofenac (VOLTAREN) 50 MG EC tablet    cyclobenzaprine (FLEXERIL) 10 MG tablet   PAIN:  Are you having pain? Yes: NPRS scale: 0/10 Pain location: Low back Pain description: Aching Aggravating factors: Prolonged standing/ shopping Relieving factors: Sitting  PRECAUTIONS: None  WEIGHT BEARING RESTRICTIONS: No  FALLS:  Has patient fallen in last 6 months? No  LIVING  ENVIRONMENT: Lives with: lives with their partner Lives in: House/apartment Has following equipment at home: None  OCCUPATION: Works from home/ sedentary job  PLOF: Independent  PATIENT GOALS: Decrease low back pain/ radicular symptoms.   NEXT MD VISIT:  PRN   OBJECTIVE:   DIAGNOSTIC FINDINGS:  FINDINGS: There are 5 non-rib-bearing lumbar vertebra with unfused transverse process apophyses at L1. normal alignment. Normal vertebral body heights. Minor L5-S1 anterior spurring. The remaining disc spaces are preserved. No evidence of fracture, focal bone abnormality or pars defects. Cholecystectomy clips in the right upper quadrant. Left lower pole renal stone, seen on 05/29/2020 abdominal CT.   IMPRESSION: Minimal spondylosis with spurring at L5-S1.     Electronically Signed   By: Keith Rake M.D.   On: 08/27/2021 16:06  PATIENT SURVEYS:  FOTO initial 49/ goal 71  SCREENING FOR RED FLAGS: Bowel or bladder incontinence: No Spinal tumors: No Cauda equina syndrome: No Compression fracture: No Abdominal aneurysm: No  COGNITION: Overall cognitive status: Within functional limits for tasks assessed     SENSATION: WFL  MUSCLE LENGTH: Hamstrings: Right 65 deg; Left 70 deg Thomas test: N/T  POSTURE: No Significant postural limitations  PALPATION: Hypomobility noted in lumbar spine with unilateral/ central PA mobs.  No tenderness noted.   LUMBAR ROM:   AROM eval  Flexion 25% limited  Extension 50% limited  Right lateral flexion WFL  Left lateral flexion WFL  Right rotation WFL  Left rotation WFL   (Blank rows = not tested)  No change in symptoms with repeated movement/ extension  LOWER EXTREMITY ROM:     B LE AROM WFL.  Good symmetry between L and R.  LOWER EXTREMITY MMT:    MMT Right eval Left eval  Hip flexion 5 5  Hip extension    Hip abduction 4+ 4+  Hip adduction    Hip internal rotation 5 5  Hip external rotation 5  5  Knee flexion 5 5   Knee extension 5 5  Ankle dorsiflexion 5 5  Ankle plantarflexion    Ankle inversion    Ankle eversion     (Blank rows = not tested)  LUMBAR SPECIAL TESTS:  Straight leg raise test: Positive, FABER test: Negative, and Gaenslen's test: Negative  FUNCTIONAL TESTS:  TBD  GAIT: Distance walked: in clinic Assistive device utilized: None Level of assistance: Complete Independence Comments: normalized gait  1/15: Supine L hamstring: 76 deg. (Improvement noted),  R hamstring 65 deg. (No pain).   TODAY'S TREATMENT:                                                                                                                              DATE: 05/18/22   Subjective:  Pt. Entered PT with no c/o low back pain or radicular symptoms. Pt. States Dr. Recommended lowering appt. frequency to biweekly. Pt. States she has 2/10 pain with activities that require long term bending of the back but the pain resolves immediately with rest.   There.ex.:   Seated on blue ball at //-bars (mirror feedback): marching/ alt. Hand and contralateral knee raises 2x10 each. //-bar for UE support/balance  Supine TrA contraction: marching/hip abduction with BTB/ hip flexion BTB/ bridging 10x each (good technique). No pain during bridging.  Manual tx:  Prone Grade II-III PA mobs. To low thoracic and lumbar spine (central)- 3x20 sec.  No c/o tenderness reported.   Prone STM to lumbar paraspinals (added use of Hypervolt).   MH to lumbar spine (varying places during [facility]).     PATIENT EDUCATION:  Education details: Access Code: [email or web address] Person educated: Patient Education method: Explanation, Demonstration, and Handouts Education comprehension: verbalized understanding and returned demonstration  HOME EXERCISE PROGRAM: Access Code: [email or web address] URL: [email or web address] Date: 04/12/2022 Prepared by: Dorcas Carrow  Exercises - Supine Lower Trunk Rotation  - 2 x daily - 7 x weekly - 1 sets - 3  reps - Hooklying Single Knee to Chest Stretch  - 2 x daily - 7 x weekly - 1 sets - 3 reps - Seated Hamstring Stretch  - 2 x daily - 7 x weekly - 1 sets - 3 reps - 20 seconds hold - Standing Hamstring Stretch with Step  - 2 x daily - 7 x weekly - 1 sets - 3 reps - 20 seconds hold - Standing Lumbar Extension  - 2 x daily - 7 x weekly - 1 sets - 5 reps  Access Code: [email or web address] URL: [email or web address] Date: 04/27/2022 Prepared by: Dorcas Carrow  Exercises - Supine Lower Trunk Rotation  - 2 x daily - 7 x weekly - 1 sets - 3 reps - Hooklying Single Knee to Chest Stretch  - 2 x daily - 7 x weekly - 1 sets - 3 reps - Seated Hamstring Stretch  - 2 x daily - 7 x weekly - 1 sets - 3 reps - 20 seconds hold - Standing  Hamstring Stretch with Step  - 2 x daily - 7 x weekly - 1 sets - 3 reps - 20 seconds hold - Standing Lumbar Extension  - 2 x daily - 7 x weekly - 1 sets - 5 reps - Supine Posterior Pelvic Tilt  - 1 x daily - 7 x weekly - 1 sets - 10 reps - Dead Bug  - 1 x daily - 7 x weekly - 1 sets - 20 reps - Supine Bridge  - 1 x daily - 7 x weekly - 1 sets - 10 reps  ASSESSMENT:  CLINICAL IMPRESSION: Pt. presents with no pain in back currently at rest and has very little pain(<2/10 NPS) during activity that resolves quickly with rest. Pt. demonstrates independence and good understanding of HEP. Pt. Requires occasional cuing during abdominal exercises to contract TrA and keep good lumbar biomechanics. Pt. Requires few rest breaks. Pt. Required little UE support and had no instances of LOB. PT will continue to progress pt. To increased strength gains of abdomen and hips to decrease load on lumbar spine and prevent reoccurrence of pain. Pt. Will continue to benefit from skilled therapy treatment on a biweekly bases to continue progression of strength gains, maintain mobility of the lumbar spine, and progress pt.'s HEP.    OBJECTIVE IMPAIRMENTS: decreased activity tolerance, decreased  endurance, decreased mobility, decreased ROM, decreased strength, hypomobility, impaired flexibility, improper body mechanics, postural dysfunction, obesity, and pain.   ACTIVITY LIMITATIONS: carrying, lifting, bending, sitting, standing, squatting, transfers, bed mobility, and locomotion level  PARTICIPATION LIMITATIONS: cleaning, laundry, driving, shopping, community activity, and occupation  PERSONAL FACTORS: Age and Past/current experiences are also affecting patient's functional outcome.   REHAB POTENTIAL: Good  CLINICAL DECISION MAKING: Stable/uncomplicated  EVALUATION COMPLEXITY: Low   GOALS: Goals reviewed with patient? Yes  SHORT TERM GOALS: Target date: 04/26/22  Pt. Independent with HEP to increase lumbar ROM/ core stability to improve pain-free mobility.  Baseline:  see above Goal status: Goal met   LONG TERM GOALS: Target date: 05/10/22  Pt. Will increase FOTO to 71 to improve pain-free mobility.   Baseline:  initial FOTO 49.  1/17: 82 goal met Goal status: Goal met  2.  Pt. Will report no low back pain with rolling over/ bed mobility to improve pain-free mobility.  Baseline: increase in R glut/ low back symptoms getting off mat table Goal status: Goal met  3.  Pt. Will report no increase in low back pain/ R LE radicular symptoms with daily household chores/ work.   Baseline: See above Goal status: Partially met   PLAN:  PT FREQUENCY: 1-2x/week  PT DURATION: 4 weeks  PLANNED INTERVENTIONS: Therapeutic exercises, Therapeutic activity, Neuromuscular re-education, Patient/Family education, Self Care, Joint mobilization, Joint manipulation, Dry Needling, Electrical stimulation, Spinal manipulation, Spinal mobilization, Cryotherapy, Moist heat, Manual therapy, and Re-evaluation.  PLAN FOR NEXT SESSION:  Discuss sx./pain if any, progress HEP    Sidney Kann B. Rogers Blocker, SPT Pura Spice, PT, DPT # [phone] 05/18/2022, 3:44 PM

## 2022-05-19 IMAGING — CR DG LUMBAR SPINE COMPLETE 4+V
5 series · 5 of 5 positions shown · non-contrast
Comparison: None Available.

CLINICAL DATA: Right lumbosacral radiculopathy. Right low back pain
since [REDACTED].

EXAM:
LUMBAR SPINE - COMPLETE 4+ VIEW

[l-spine ap]
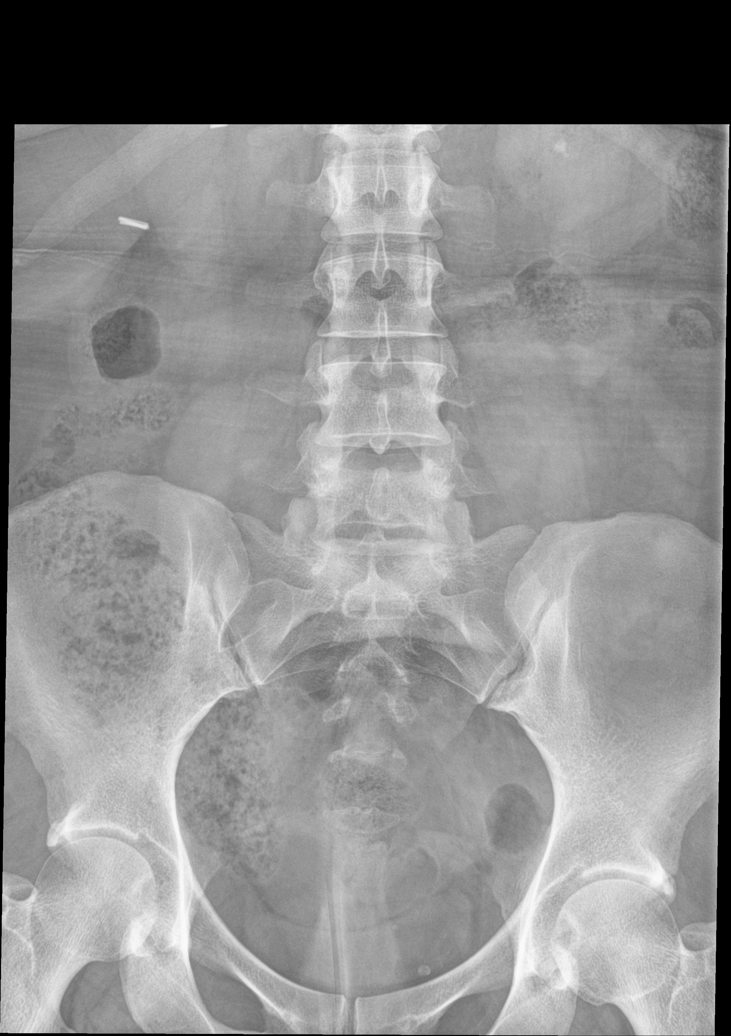

[l-spine obl (1 of 2)]
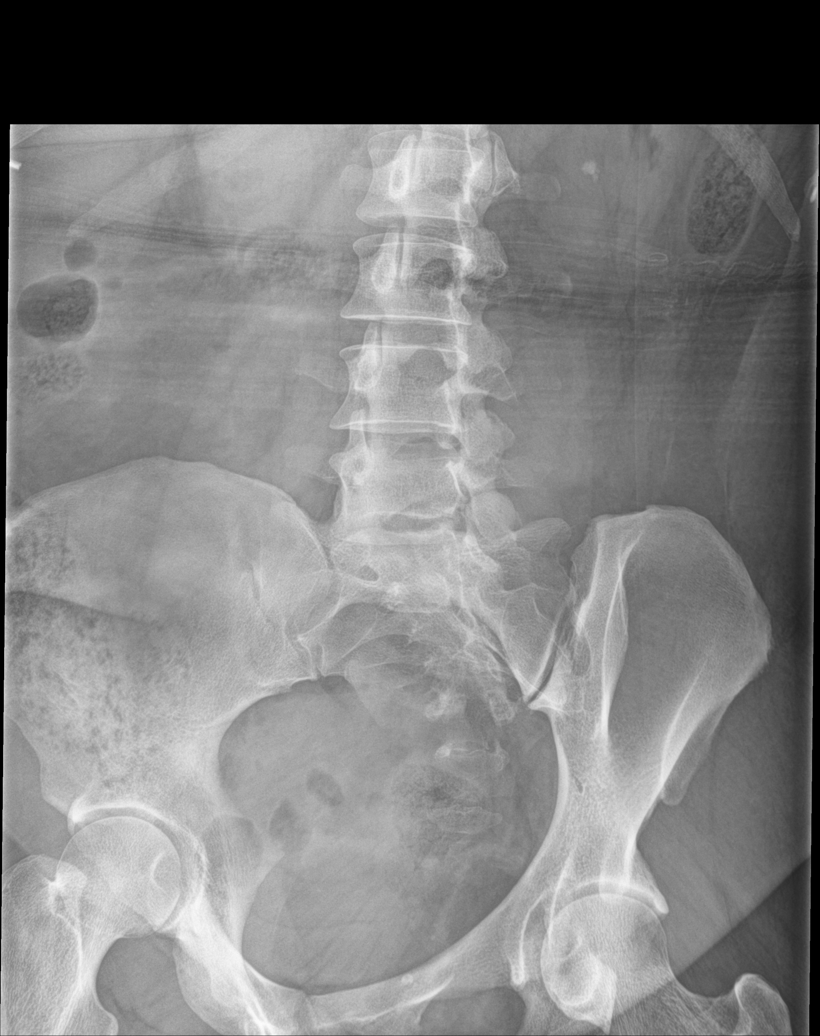

[l-spine obl (2 of 2)]
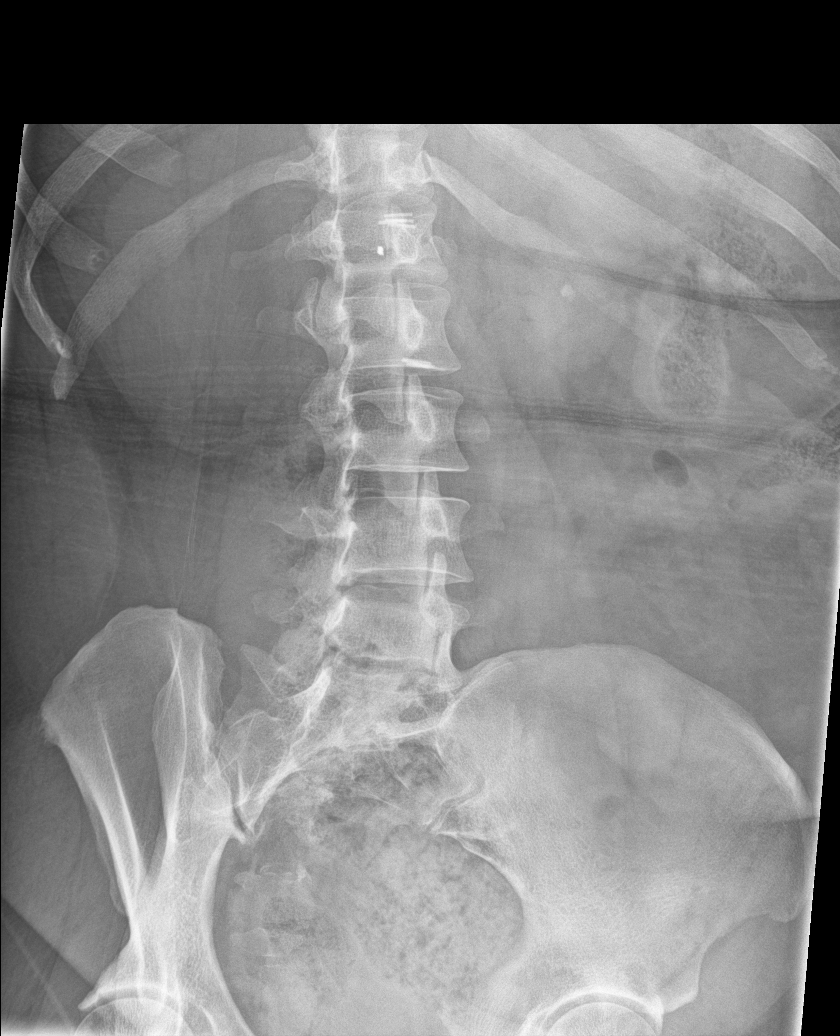

[l-spine lat]
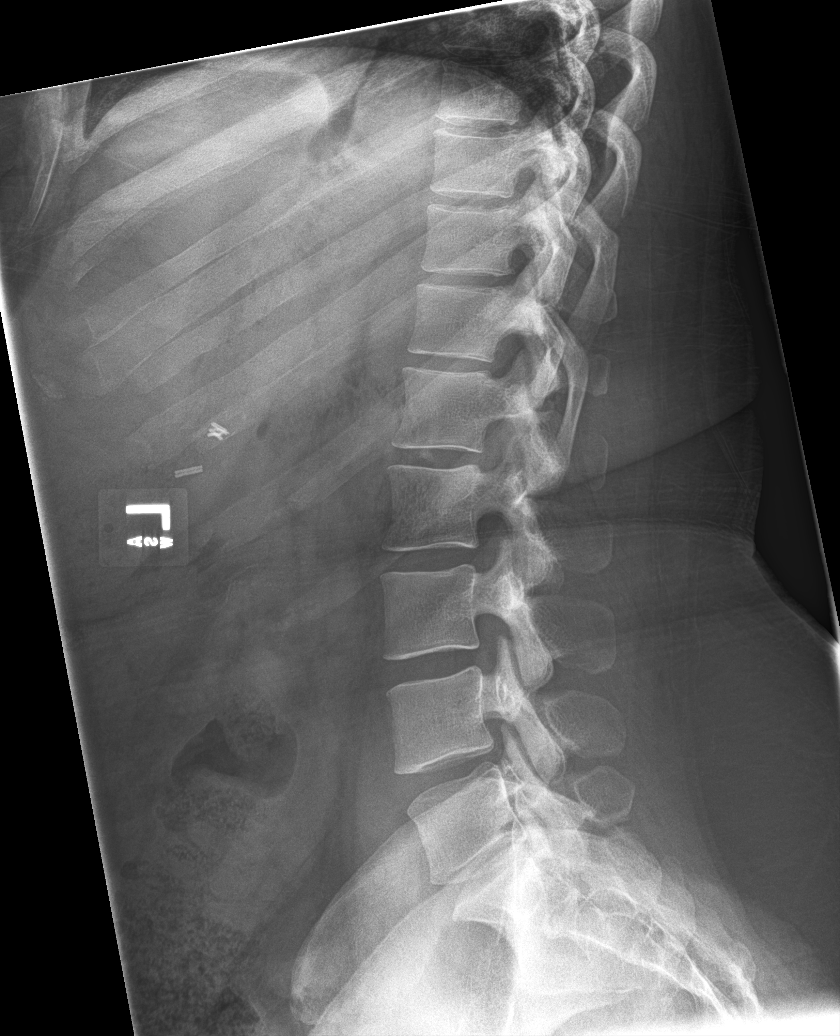

[l-spine spot]
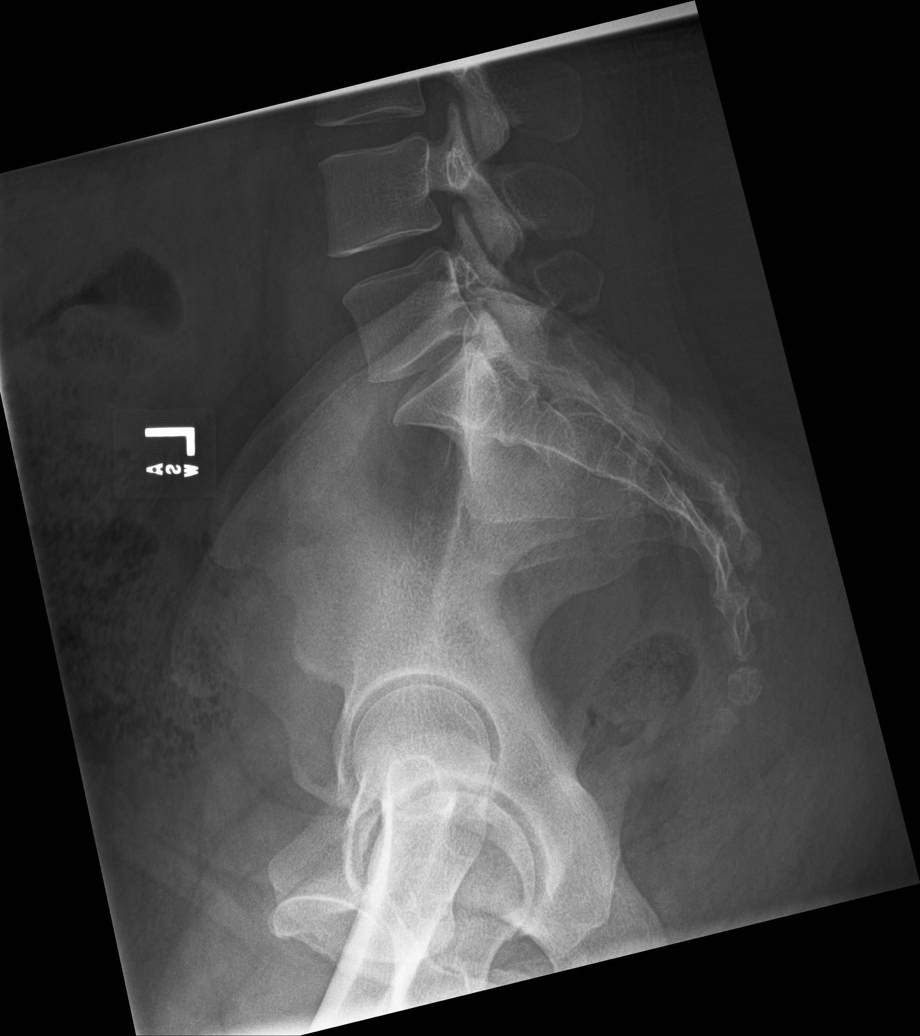

[5 of 5 positions shown; findings below may reference images not displayed]

FINDINGS: There are 5 non-rib-bearing lumbar vertebra with unfused transverse
process apophyses at L1. normal alignment. Normal vertebral body
heights. Minor L5-S1 anterior spurring. The remaining disc spaces
are preserved. No evidence of fracture, focal bone abnormality or
pars defects. Cholecystectomy clips in the right upper quadrant.
Left lower pole renal stone, seen on 05/29/2020 abdominal CT.
IMPRESSION: Minimal spondylosis with spurring at L5-S1.

## 2022-05-23 ENCOUNTER — Encounter: Payer: 59 | Admitting: Physical Therapy

## 2022-05-23 MED FILL — Iron Sucrose Inj 20 MG/ML (Fe Equiv): INTRAVENOUS | Qty: 10 | Status: AC

## 2022-05-24 ENCOUNTER — Inpatient Hospital Stay: Payer: 59 | Admitting: Nurse Practitioner

## 2022-05-24 ENCOUNTER — Inpatient Hospital Stay: Payer: 59

## 2022-05-24 ENCOUNTER — Encounter: Payer: Self-pay | Admitting: Nurse Practitioner

## 2022-05-24 VITALS — BP 137/97 | Temp 98.6°F | Ht 64.0 in | Wt 268.0 lb

## 2022-05-24 DIAGNOSIS — D573 Sickle-cell trait: Secondary | ICD-10-CM | POA: Diagnosis not present

## 2022-05-24 DIAGNOSIS — D500 Iron deficiency anemia secondary to blood loss (chronic): Secondary | ICD-10-CM | POA: Diagnosis not present

## 2022-05-24 DIAGNOSIS — N926 Irregular menstruation, unspecified: Secondary | ICD-10-CM | POA: Diagnosis not present

## 2022-05-24 DIAGNOSIS — Z801 Family history of malignant neoplasm of trachea, bronchus and lung: Secondary | ICD-10-CM | POA: Diagnosis not present

## 2022-05-24 NOTE — Progress Notes (Signed)
[place]  Telephone:([phone]) [phone] Fax:(336) [phone]  ID: Durenda Guthrie OB: Apr 23, 1991  MR#: [id number]  CNO#:[id number]  Patient Care Team: Montel Culver, MD as PCP - General (Family Medicine)  CHIEF COMPLAINT: Iron deficiency anemia  INTERVAL HISTORY: Patient is a 32 year old female with history of iron deficiency anemia secondary to heavy menses who returns to clinic for follow-up.  She has ongoing irregular uterine bleeding that is heavy at times. Continues to take over the counter iron. Tolerating well. Is tired but otherwise feels well. Denies complaints otherwise.   REVIEW OF SYSTEMS:   Review of Systems  Constitutional:  Positive for malaise/fatigue. Negative for chills, fever and weight loss.  Respiratory:  Negative for cough and shortness of breath.   Cardiovascular:  Negative for chest pain, palpitations, orthopnea and claudication.  Gastrointestinal:  Negative for abdominal pain, blood in stool, constipation, diarrhea, melena, nausea and vomiting.  Genitourinary:  Negative for frequency and hematuria.  Musculoskeletal:  Negative for back pain, joint pain, myalgias and neck pain.  Skin: Negative.   Neurological:  Negative for dizziness, tingling, weakness and headaches.  Endo/Heme/Allergies:  Does not bruise/bleed easily.  Psychiatric/Behavioral:  Negative for depression. The patient is not nervous/anxious.   As per HPI. Otherwise, a complete review of systems is negative.  PAST MEDICAL HISTORY: Past Medical History:  Diagnosis Date   Anemia     Patient Active Problem List   Diagnosis Date Noted   Need for immunization against influenza 03/29/2022   Annual physical exam 08/26/2021   Right lumbosacral radiculopathy 08/12/2021   Fibroid, uterine 08/10/2021   Hypertriglyceridemia 02/08/2021   Anxiety and depression 07/13/2020   Gastroesophageal reflux disease without esophagitis 07/13/2020   Fibroid 07/13/2020   Class 3 severe obesity due  to excess calories with body mass index (BMI) of 45.0 to 49.9 in adult ([place]) 07/13/2020   Sickle cell trait ([place]) 07/01/2020   Iron deficiency anemia 06/06/2020   PAST SURGICAL HISTORY: Past Surgical History:  Procedure Laterality Date   CHOLECYSTECTOMY  2013    FAMILY HISTORY: Family History  Problem Relation Age of Onset   Diabetes Maternal Grandmother    Lung cancer Maternal Grandfather    Kidney disease Mother    Clotting disorder Father    Sickle cell trait Father     ADVANCED DIRECTIVES (Y/N):  N  HEALTH MAINTENANCE: Social History   Tobacco Use   Smoking status: Never   Smokeless tobacco: Never  Vaping Use   Vaping Use: Never used  Substance Use Topics   Alcohol use: Yes    Alcohol/week: 2.0 standard drinks of alcohol    Types: 1 Glasses of wine, 1 Shots of liquor per week   Drug use: Never    Colonoscopy:  PAP:  Bone density:  Lipid panel:  No Known Allergies  Current Outpatient Medications  Medication Sig Dispense Refill   citalopram (CELEXA) 20 MG tablet TAKE 1 TABLET BY MOUTH EVERY DAY 30 tablet 2   cyclobenzaprine (FLEXERIL) 10 MG tablet Take 1 tablet (10 mg total) by mouth 3 (three) times daily as needed for muscle spasms. 30 tablet 1   diclofenac (VOLTAREN) 50 MG EC tablet TAKE 1 TABLET (50 MG TOTAL) BY MOUTH 2 (TWO) TIMES DAILY. X 2 WEEKS THEN TWICE DAILY AS-NEEDED 180 tablet 0   gabapentin (NEURONTIN) 100 MG capsule Take 1 capsule (100 mg total) by mouth at bedtime. Can increase by 1 capsule nightly every week until symptoms controlled. Do not exceed 3 capsules nightly. [place]  capsule 1   Iron Sucrose (VENOFER IV) Inject 200 mg into the vein every 7 (seven) days.     Iron-Vitamin C (VITRON-C) 65-125 MG TABS Take 1 tablet by mouth daily. 90 tablet 1   omeprazole (PRILOSEC) 40 MG capsule TAKE 1 CAPSULE BY MOUTH DAILY AS NEEDED. 90 capsule 1   No current facility-administered medications for this visit.    OBJECTIVE: Vitals:   05/24/22 1310  BP: (!)  137/97  Temp: 98.6 F (37 C)      Body mass index is 46 kg/m.    ECOG FS:1 - Symptomatic but completely ambulatory  General: Well-developed, well-nourished, no acute distress. Lungs: No audible wheezing or coughing Heart: Regular rate and rhythm.  Abdomen: Soft, nontender, nondistended.  Musculoskeletal: No edema, cyanosis, or clubbing. Neuro: Alert, answering all questions appropriately. Cranial nerves grossly intact. Skin: No rashes or petechiae noted. Psych: Normal affect.    LAB RESULTS:  Lab Results  Component Value Date   NA 137 08/27/2021   K 4.3 08/27/2021   CL 102 08/27/2021   CO2 20 08/27/2021   GLUCOSE 109 (H) 08/27/2021   BUN 13 08/27/2021   CREATININE 0.81 08/27/2021   CALCIUM 8.8 08/27/2021   PROT 7.4 08/27/2021   ALBUMIN 4.4 08/27/2021   AST 19 08/27/2021   ALT 24 08/27/2021   ALKPHOS 79 08/27/2021   BILITOT <0.2 08/27/2021   GFRNONAA >60 05/29/2020    Lab Results  Component Value Date   WBC 5.5 05/18/2022   NEUTROABS 2.6 02/15/2022   HGB 11.1 (L) 05/18/2022   HCT 34.1 (L) 05/18/2022   MCV 87.4 05/18/2022   PLT 270 05/18/2022   Lab Results  Component Value Date   IRON 57 05/18/2022   TIBC 337 05/18/2022   IRONPCTSAT 17 05/18/2022   Lab Results  Component Value Date   FERRITIN 75 05/18/2022    STUDIES: No results found.  ASSESSMENT: Iron deficiency anemia.  PLAN:    1.  Iron deficiency anemia: etiology secondary to heavy menses. Last IV iron 02/15/22. Previously discussed switching from venofer to monoferric but does not appear it was approved by insurance. She will continue venofer. Hemoglobin persistently decreased, today, 11.1 which is stable. Microcytosis has resolved. Ferritin improved to 75. Sat 17%. Symptoms have improved. Hold venofer today. She is tolerating vitron c well. Prefers to take generic and will get otc.   2.  Sickle cell trait: No intervention needed.  She does not have any children at this time.  Likely the  etiology of her mild baseline anemia.  3. Heavy menses- hx of fibroids. Previously saw Dr. Georgianne Fick and discussed options for management in March 2022. Encouraged follow up when she's ready. Can re-refer to [employer] or elsewhere.   Disposition: RTC in 3 months for labs (cbc, ferritin, iron studies) Day to week later see APP for follow up, +/- venofer; appt can be virtual if patient prefers  I spent a total of 15 minutes reviewing chart data, face-to-face evaluation with the patient, counseling and coordination of care as detailed above.  Patient expressed understanding and was in agreement with this plan. She also understands that She can call clinic at any time with any questions, concerns, or complaints.   Verlon Au, NP   05/24/2022

## 2022-05-25 ENCOUNTER — Encounter: Payer: 59 | Admitting: Physical Therapy

## 2022-05-30 ENCOUNTER — Encounter: Payer: 59 | Admitting: Physical Therapy

## 2022-06-01 ENCOUNTER — Ambulatory Visit: Payer: 59 | Attending: Family Medicine | Admitting: Physical Therapy

## 2022-06-01 DIAGNOSIS — M6281 Muscle weakness (generalized): Secondary | ICD-10-CM | POA: Diagnosis not present

## 2022-06-01 DIAGNOSIS — M5417 Radiculopathy, lumbosacral region: Secondary | ICD-10-CM | POA: Insufficient documentation

## 2022-06-01 DIAGNOSIS — M2560 Stiffness of unspecified joint, not elsewhere classified: Secondary | ICD-10-CM | POA: Insufficient documentation

## 2022-06-01 NOTE — Patient Instructions (Signed)
Access Code: [email or web address] URL: [email or web address] Date: 06/01/2022 Prepared by: Dorcas Carrow  Exercises - Supine Lower Trunk Rotation  - 2 x daily - 7 x weekly - 1 sets - 3 reps - Hooklying Single Knee to Chest Stretch  - 2 x daily - 7 x weekly - 1 sets - 3 reps - Seated Hamstring Stretch  - 2 x daily - 7 x weekly - 1 sets - 3 reps - 20 seconds hold - Standing Lumbar Extension  - 2 x daily - 7 x weekly - 1 sets - 5 reps - Supine Bridge  - 1 x daily - 7 x weekly - 1 sets - 10 reps - Supine March  - 1 x daily - 7 x weekly - 3 sets - 10 reps - Supine Bridge with Gluteal Set and Spinal Articulation  - 1 x daily - 7 x weekly - 3 sets - 10 reps

## 2022-06-01 NOTE — Therapy (Unsigned)
OUTPATIENT PHYSICAL THERAPY THORACOLUMBAR TREATMENT   Patient Name: Eileen Vargas MRN: [id number] DOB:03-04-1991, 32 y.o., female Today's Date: 06/01/2022   END OF SESSION:  PT End of Session - 06/01/22 1431     Visit Number 7    Number of Visits 9    Date for PT Re-Evaluation 06/08/22    PT Start Time 9983    PT Stop Time 1524    PT Time Calculation (min) 53 min    Activity Tolerance Patient tolerated treatment well;No increased pain    Behavior During Therapy [facility] for tasks assessed/performed             Past Medical History:  Diagnosis Date   Anemia    Past Surgical History:  Procedure Laterality Date   CHOLECYSTECTOMY  2013   Patient Active Problem List   Diagnosis Date Noted   Need for immunization against influenza 03/29/2022   Annual physical exam 08/26/2021   Right lumbosacral radiculopathy 08/12/2021   Fibroid, uterine 08/10/2021   Hypertriglyceridemia 02/08/2021   Anxiety and depression 07/13/2020   Gastroesophageal reflux disease without esophagitis 07/13/2020   Fibroid 07/13/2020   Class 3 severe obesity due to excess calories with body mass index (BMI) of 45.0 to 49.9 in adult ([place]) 07/13/2020   Sickle cell trait ([place]) 07/01/2020   Iron deficiency anemia 06/06/2020    PCP: Montel Culver, MD  REFERRING PROVIDER: Montel Culver, MD  REFERRING DIAG: Right lumbosacral radiculopathy  Rationale for Evaluation and Treatment: Rehabilitation  THERAPY DIAG:  Right lumbosacral radiculopathy  Joint stiffness  Muscle weakness (generalized)  ONSET DATE: 04/2020  SUBJECTIVE:                                                                                                                                                                                           SUBJECTIVE STATEMENT:  EVALUATION Pt. Reports chronic h/o low back pain with R LE radicular symptoms into R thigh/ calf.  Pt. Reports she had an acute flare-up of chronic low back issues with  prolonged standing 2 weeks ago.  No pain in seated posture.  Pt. Works from home at [employer].  Pt. C/o 9/10 R low back with shopping 1-2 hours.  Pt. Taking Gabapentin at night.  Pt. States pain worsens t/o the day.    PERTINENT HISTORY:   Chief Complaint  Patient presents with   Right lumbosacral radiculopathy      For about 5 days, States it hurts worst with walking and better when sitting.          Vitals:    03/29/22 1529 03/29/22 1533  BP: (!) 134/90 Marland Kitchen)  136/90  Pulse: 76    SpO2: 99%         Vitals:    03/29/22 1529  Weight: 266 lb (120.7 kg)  Height: '5\' 4"'$  (1.626 m)    Body mass index is 45.66 kg/m.   Imaging Results  No results found.      Independent interpretation of notes and tests performed by another provider:    Independent interpretation of lumbar x-rays dated 08/26/21 reveal straightening of thoracolumbar expected curvature, subtle listhesis of L5 on S1 with subtle osteophyte formation, no acute processes.   Procedures performed:    None   Pertinent History, Exam, Impression, and Recommendations:    Problem List Items Addressed This Visit              Nervous and Auditory    Right lumbosacral radiculopathy - Primary      Recurrent lumbosacral pain with right-sided radiation, atraumatic onset x 5 days, worse with extension, ADLs. Examination with +SLR right, +Kemp's localizing right, tightness throughout deep hip and core structures. Given x-ray and exam findings, plan for medication management, formal PT, follow-up in 6 days.        Relevant Medications    gabapentin (NEURONTIN) 100 MG capsule    diclofenac (VOLTAREN) 50 MG EC tablet    cyclobenzaprine (FLEXERIL) 10 MG tablet   PAIN:  Are you having pain? Yes: NPRS scale: 0/10 Pain location: Low back Pain description: Aching Aggravating factors: Prolonged standing/ shopping Relieving factors: Sitting  PRECAUTIONS: None  WEIGHT BEARING RESTRICTIONS: No  FALLS:  Has patient fallen in last 6  months? No  LIVING ENVIRONMENT: Lives with: lives with their partner Lives in: House/apartment Has following equipment at home: None  OCCUPATION: Works from home/ sedentary job  PLOF: Independent  PATIENT GOALS: Decrease low back pain/ radicular symptoms.   NEXT MD VISIT:  PRN   OBJECTIVE:   DIAGNOSTIC FINDINGS:  FINDINGS: There are 5 non-rib-bearing lumbar vertebra with unfused transverse process apophyses at L1. normal alignment. Normal vertebral body heights. Minor L5-S1 anterior spurring. The remaining disc spaces are preserved. No evidence of fracture, focal bone abnormality or pars defects. Cholecystectomy clips in the right upper quadrant. Left lower pole renal stone, seen on 05/29/2020 abdominal CT.   IMPRESSION: Minimal spondylosis with spurring at L5-S1.     Electronically Signed   By: Keith Rake M.D.   On: 08/27/2021 16:06  PATIENT SURVEYS:  FOTO initial 49/ goal 71  SCREENING FOR RED FLAGS: Bowel or bladder incontinence: No Spinal tumors: No Cauda equina syndrome: No Compression fracture: No Abdominal aneurysm: No  COGNITION: Overall cognitive status: Within functional limits for tasks assessed     SENSATION: WFL  MUSCLE LENGTH: Hamstrings: Right 65 deg; Left 70 deg Thomas test: N/T  POSTURE: No Significant postural limitations  PALPATION: Hypomobility noted in lumbar spine with unilateral/ central PA mobs.  No tenderness noted.   LUMBAR ROM:   AROM eval  Flexion 25% limited  Extension 50% limited  Right lateral flexion WFL  Left lateral flexion WFL  Right rotation WFL  Left rotation WFL   (Blank rows = not tested)  No change in symptoms with repeated movement/ extension  LOWER EXTREMITY ROM:     B LE AROM WFL.  Good symmetry between L and R.  LOWER EXTREMITY MMT:    MMT Right eval Left eval  Hip flexion 5 5  Hip extension    Hip abduction 4+ 4+  Hip adduction    Hip internal rotation  5 5  Hip external rotation 5  5  Knee flexion 5 5  Knee extension 5 5  Ankle dorsiflexion 5 5  Ankle plantarflexion    Ankle inversion    Ankle eversion     (Blank rows = not tested)  LUMBAR SPECIAL TESTS:  Straight leg raise test: Positive, FABER test: Negative, and Gaenslen's test: Negative  FUNCTIONAL TESTS:  TBD  GAIT: Distance walked: in clinic Assistive device utilized: None Level of assistance: Complete Independence Comments: normalized gait  1/15: Supine L hamstring: 76 deg. (Improvement noted),  R hamstring 65 deg. (No pain).   TODAY'S TREATMENT:                                                                                                                              DATE: 06/01/22   Subjective:  Pt. Entered PT with 0-1/10 low back pain on NPS and no reports of radicular symptoms. Pt. States she has had a good transition to biweekly therapy appointments with no increase in low back sx. Pt. states she has had a decrease in pain with activities that require long term bending of the back. PT. States she has no LBP when working for long periods (long-term sitting). Pt. Reports decreasing gabapentin to every other night now with no increase in sx.   There.ex.:   NuStep 10 min. Seat 6. Consistent cadence. B UE/LE  Seated on blue ball at //-bars Personal assistant): TrA contraction with proper lumbar spine posture/marching/ alt. Hand and contralateral knee raises 2x10 each. //-bar for UE support/balance. No LOB.  Supine TrA contraction: hip flexion/hip abduction with Blue TB(around knees) 2x10 reps  Supine bridging with Blue TB(around knees) marches 2x10 reps (good technique). No pain during bridging. Pt. Cued to contract TrA.   Manual tx:  Prone Grade II-III PA mobs. To low thoracic and lumbar spine (central)- 3x20 sec. Tenderness reported at L4-L5 but improved with repeated CPA's.   Prone STM to lumbar paraspinals (added use of Hypervolt).      PATIENT EDUCATION:  Education details: Access Code:  [email or web address] Person educated: Patient Education method: Explanation, Demonstration, and Handouts Education comprehension: verbalized understanding and returned demonstration  HOME EXERCISE PROGRAM: Access Code: [email or web address] URL: [email or web address] Date: 04/12/2022 Prepared by: Dorcas Carrow  Exercises - Supine Lower Trunk Rotation  - 2 x daily - 7 x weekly - 1 sets - 3 reps - Hooklying Single Knee to Chest Stretch  - 2 x daily - 7 x weekly - 1 sets - 3 reps - Seated Hamstring Stretch  - 2 x daily - 7 x weekly - 1 sets - 3 reps - 20 seconds hold - Standing Hamstring Stretch with Step  - 2 x daily - 7 x weekly - 1 sets - 3 reps - 20 seconds hold - Standing Lumbar Extension  - 2 x daily - 7 x weekly - 1 sets - 5 reps  Access Code: [email or web address] URL: [email or web address] Date: 04/27/2022 Prepared  by: Dorcas Carrow  Exercises - Supine Lower Trunk Rotation  - 2 x daily - 7 x weekly - 1 sets - 3 reps - Hooklying Single Knee to Chest Stretch  - 2 x daily - 7 x weekly - 1 sets - 3 reps - Seated Hamstring Stretch  - 2 x daily - 7 x weekly - 1 sets - 3 reps - 20 seconds hold - Standing Hamstring Stretch with Step  - 2 x daily - 7 x weekly - 1 sets - 3 reps - 20 seconds hold - Standing Lumbar Extension  - 2 x daily - 7 x weekly - 1 sets - 5 reps - Supine Posterior Pelvic Tilt  - 1 x daily - 7 x weekly - 1 sets - 10 reps - Dead Bug  - 1 x daily - 7 x weekly - 1 sets - 20 reps - Supine Bridge  - 1 x daily - 7 x weekly - 1 sets - 10 reps  ASSESSMENT:  CLINICAL IMPRESSION: Pt. presents with minimal LBP(1/10 NPS) that improved to 0/10 NPS during today's there ex. Treatment. PT progressed pt.s HEP and pt. demonstrates independence and good understanding of HEP. PT continued progression of TrA exercises during today's treatment to continue strengthening pt.'s core and remove pressure from her lumbar spine. Pt. Requires occasional cuing during abdominal exercises to contract TrA  and keep good lumbar biomechanics.  PT performed MT to pt.'s L1-L5 of the lumbar spine in order check mobility and tenderness of the area. Pt. Presented with slight hypomobility and pt. Reported tenderness at L4-L5 but the pain improved with repeated CPA's. Pt. Requires few rest breaks. Pt. Required no UE support and had no instances of LOB. PT will continue to progress pt. To increased strength gains of abdomen and hips to decrease load on lumbar spine and prevent reoccurrence of pain. Pt. Will continue to benefit from skilled therapy treatment on a biweekly basis to continue progression of strength gains, maintain mobility of the lumbar spine, and progress pt.'s HEP.    OBJECTIVE IMPAIRMENTS: decreased activity tolerance, decreased endurance, decreased mobility, decreased ROM, decreased strength, hypomobility, impaired flexibility, improper body mechanics, postural dysfunction, obesity, and pain.   ACTIVITY LIMITATIONS: carrying, lifting, bending, sitting, standing, squatting, transfers, bed mobility, and locomotion level  PARTICIPATION LIMITATIONS: cleaning, laundry, driving, shopping, community activity, and occupation  PERSONAL FACTORS: Age and Past/current experiences are also affecting patient's functional outcome.   REHAB POTENTIAL: Good  CLINICAL DECISION MAKING: Stable/uncomplicated  EVALUATION COMPLEXITY: Low   GOALS: Goals reviewed with patient? Yes  SHORT TERM GOALS: Target date: 04/26/22  Pt. Independent with HEP to increase lumbar ROM/ core stability to improve pain-free mobility.  Baseline:  see above Goal status: Goal met   LONG TERM GOALS: Target date: 05/10/22  Pt. Will increase FOTO to 71 to improve pain-free mobility.   Baseline:  initial FOTO 49.  1/17: 82 goal met Goal status: Goal met  2.  Pt. Will report no low back pain with rolling over/ bed mobility to improve pain-free mobility.  Baseline: increase in R glut/ low back symptoms getting off mat table Goal  status: Goal met  3.  Pt. Will report no increase in low back pain/ R LE radicular symptoms with daily household chores/ work.   Baseline: See above Goal status: Goal met   PLAN:  PT FREQUENCY: 1-2x/week  PT DURATION: 4 weeks  PLANNED INTERVENTIONS: Therapeutic exercises, Therapeutic activity, Neuromuscular re-education, Patient/Family education, Self Care, Joint mobilization, Joint manipulation,  Dry Needling, Electrical stimulation, Spinal manipulation, Spinal mobilization, Cryotherapy, Moist heat, Manual therapy, and Re-evaluation.  PLAN FOR NEXT SESSION:  Discuss sx./pain if any, progress HEP    Denny Mccree B. Rogers Blocker, SPT Pura Spice, PT, DPT # [phone] 06/01/2022, 3:47 PM

## 2022-06-02 ENCOUNTER — Encounter: Payer: Self-pay | Admitting: Physical Therapy

## 2022-06-15 ENCOUNTER — Encounter: Payer: 59 | Admitting: Physical Therapy

## 2022-06-26 ENCOUNTER — Other Ambulatory Visit: Payer: Self-pay | Admitting: Family Medicine

## 2022-06-26 DIAGNOSIS — F419 Anxiety disorder, unspecified: Secondary | ICD-10-CM

## 2022-06-28 NOTE — Telephone Encounter (Signed)
Requested Prescriptions  Pending Prescriptions Disp Refills   citalopram (CELEXA) 20 MG tablet [Pharmacy Med Name: CITALOPRAM HBR 20 MG TABLET] 90 tablet 1    Sig: TAKE 1 TABLET BY MOUTH EVERY DAY     Psychiatry:  Antidepressants - SSRI Passed - 06/26/2022  2:31 PM      Passed - Completed PHQ-2 or PHQ-9 in the last 360 days      Passed - Valid encounter within last 6 months    Recent Outpatient Visits           1 month ago Right lumbosacral radiculopathy   [facility] [place] [place], [place]   3 months ago Right lumbosacral radiculopathy   [place] [facility] [place], Earley Abide, MD   10 months ago Annual physical exam   [place] [facility] [place], Earley Abide, MD   10 months ago Right lumbosacral radiculopathy   [facility] [place], Earley Abide, MD   1 year ago Gastroesophageal reflux disease without esophagitis   [place] [facility], Earley Abide, MD       Future Appointments             In 1 week Zigmund Daniel, Earley Abide, MD [place] at [facility], [facility]   In 1 month Zigmund Daniel, Earley Abide, MD [place] at [facility], [facility]

## 2022-06-29 ENCOUNTER — Ambulatory Visit: Payer: 59 | Attending: Family Medicine | Admitting: Physical Therapy

## 2022-06-29 ENCOUNTER — Encounter: Payer: Self-pay | Admitting: Physical Therapy

## 2022-06-29 DIAGNOSIS — M2560 Stiffness of unspecified joint, not elsewhere classified: Secondary | ICD-10-CM | POA: Diagnosis not present

## 2022-06-29 DIAGNOSIS — M6281 Muscle weakness (generalized): Secondary | ICD-10-CM

## 2022-06-29 DIAGNOSIS — M5417 Radiculopathy, lumbosacral region: Secondary | ICD-10-CM | POA: Diagnosis not present

## 2022-06-29 NOTE — Therapy (Signed)
OUTPATIENT PHYSICAL THERAPY THORACOLUMBAR TREATMENT/ RECERTIFICATION/ DISCHARGE   Patient Name: Eileen Vargas MRN: [id number] DOB:1990/09/13, 32 y.o., female Today's Date: 06/29/2022   END OF SESSION:  PT End of Session - 06/29/22 0859     Visit Number 8    Number of Visits 9    Date for PT Re-Evaluation 06/08/22    PT Start Time 0859    Activity Tolerance Patient tolerated treatment well;No increased pain    Behavior During Therapy [facility] for tasks assessed/performed             Past Medical History:  Diagnosis Date   Anemia    Past Surgical History:  Procedure Laterality Date   CHOLECYSTECTOMY  2013   Patient Active Problem List   Diagnosis Date Noted   Need for immunization against influenza 03/29/2022   Annual physical exam 08/26/2021   Right lumbosacral radiculopathy 08/12/2021   Fibroid, uterine 08/10/2021   Hypertriglyceridemia 02/08/2021   Anxiety and depression 07/13/2020   Gastroesophageal reflux disease without esophagitis 07/13/2020   Fibroid 07/13/2020   Class 3 severe obesity due to excess calories with body mass index (BMI) of 45.0 to 49.9 in adult ([place]) 07/13/2020   Sickle cell trait ([place]) 07/01/2020   Iron deficiency anemia 06/06/2020    PCP: Montel Culver, MD  REFERRING PROVIDER: Montel Culver, MD  REFERRING DIAG: Right lumbosacral radiculopathy  Rationale for Evaluation and Treatment: Rehabilitation  THERAPY DIAG:  Right lumbosacral radiculopathy  Joint stiffness  Muscle weakness (generalized)  ONSET DATE: 04/2020  SUBJECTIVE:                                                                                                                                                                                           SUBJECTIVE STATEMENT:  EVALUATION Pt. Reports chronic h/o low back pain with R LE radicular symptoms into R thigh/ calf.  Pt. Reports she had an acute flare-up of chronic low back issues with prolonged standing 2 weeks ago.   No pain in seated posture.  Pt. Works from home at [employer].  Pt. C/o 9/10 R low back with shopping 1-2 hours.  Pt. Taking Gabapentin at night.  Pt. States pain worsens t/o the day.    PERTINENT HISTORY:   Chief Complaint  Patient presents with   Right lumbosacral radiculopathy      For about 5 days, States it hurts worst with walking and better when sitting.          Vitals:    03/29/22 1529 03/29/22 1533  BP: (!) 134/90 (!) 136/90  Pulse: 76    SpO2: 99%  Vitals:    03/29/22 1529  Weight: 266 lb (120.7 kg)  Height: '5\' 4"'$  (1.626 m)    Body mass index is 45.66 kg/m.   Imaging Results  No results found.      Independent interpretation of notes and tests performed by another provider:    Independent interpretation of lumbar x-rays dated 08/26/21 reveal straightening of thoracolumbar expected curvature, subtle listhesis of L5 on S1 with subtle osteophyte formation, no acute processes.   Procedures performed:    None   Pertinent History, Exam, Impression, and Recommendations:    Problem List Items Addressed This Visit              Nervous and Auditory    Right lumbosacral radiculopathy - Primary      Recurrent lumbosacral pain with right-sided radiation, atraumatic onset x 5 days, worse with extension, ADLs. Examination with +SLR right, +Kemp's localizing right, tightness throughout deep hip and core structures. Given x-ray and exam findings, plan for medication management, formal PT, follow-up in 6 days.        Relevant Medications    gabapentin (NEURONTIN) 100 MG capsule    diclofenac (VOLTAREN) 50 MG EC tablet    cyclobenzaprine (FLEXERIL) 10 MG tablet   PAIN:  Are you having pain? Yes: NPRS scale: 0/10 Pain location: Low back Pain description: Aching Aggravating factors: Prolonged standing/ shopping Relieving factors: Sitting  PRECAUTIONS: None  WEIGHT BEARING RESTRICTIONS: No  FALLS:  Has patient fallen in last 6 months? No  LIVING  ENVIRONMENT: Lives with: lives with their partner Lives in: House/apartment Has following equipment at home: None  OCCUPATION: Works from home/ sedentary job  PLOF: Independent  PATIENT GOALS: Decrease low back pain/ radicular symptoms.   NEXT MD VISIT:  PRN   OBJECTIVE:   DIAGNOSTIC FINDINGS:  FINDINGS: There are 5 non-rib-bearing lumbar vertebra with unfused transverse process apophyses at L1. normal alignment. Normal vertebral body heights. Minor L5-S1 anterior spurring. The remaining disc spaces are preserved. No evidence of fracture, focal bone abnormality or pars defects. Cholecystectomy clips in the right upper quadrant. Left lower pole renal stone, seen on 05/29/2020 abdominal CT.   IMPRESSION: Minimal spondylosis with spurring at L5-S1.   Electronically Signed   By: Keith Rake M.D.   On: 08/27/2021 16:06  PATIENT SURVEYS:  FOTO initial 49/ goal 71  SCREENING FOR RED FLAGS: Bowel or bladder incontinence: No Spinal tumors: No Cauda equina syndrome: No Compression fracture: No Abdominal aneurysm: No  COGNITION: Overall cognitive status: Within functional limits for tasks assessed     SENSATION: WFL  MUSCLE LENGTH: Hamstrings: Right 65 deg; Left 70 deg Thomas test: N/T  POSTURE: No Significant postural limitations  PALPATION: Hypomobility noted in lumbar spine with unilateral/ central PA mobs.  No tenderness noted.   LUMBAR ROM:   AROM eval  Flexion 25% limited  Extension 50% limited  Right lateral flexion WFL  Left lateral flexion WFL  Right rotation WFL  Left rotation WFL   (Blank rows = not tested)  No change in symptoms with repeated movement/ extension  LOWER EXTREMITY ROM:     B LE AROM WFL.  Good symmetry between L and R.  LOWER EXTREMITY MMT:    MMT Right eval Left eval  Hip flexion 5 5  Hip extension    Hip abduction 4+ 4+  Hip adduction    Hip internal rotation 5 5  Hip external rotation 5 5  Knee flexion 5 5   Knee extension 5 5  Ankle dorsiflexion 5 5  Ankle plantarflexion    Ankle inversion    Ankle eversion     (Blank rows = not tested)  LUMBAR SPECIAL TESTS:  Straight leg raise test: Positive, FABER test: Negative, and Gaenslen's test: Negative  FUNCTIONAL TESTS:  TBD  GAIT: Distance walked: in clinic Assistive device utilized: None Level of assistance: Complete Independence Comments: normalized gait  1/15: Supine L hamstring: 76 deg. (Improvement noted),  R hamstring 65 deg. (No pain).   TODAY'S TREATMENT:                                                                                                                              DATE: 06/29/22   Subjective:  Pt. Entered PT with 0-1/10 low back pain on NPS and no reports of radicular symptoms. Pt. Missed last PT appt. Secondary to Covid.  Pt. Is no longer taking the Gabapentin.  Pt. Returns to Dr. Zigmund Daniel on 3/18.    There.ex.:   NuStep 10 min. L4 Seat 6. Consistent cadence. B UE/LE.  Discussed daily activity.   Reassessment of goals.  Lumbar AROM WNL (all planes)- no pain or radicular symptoms.     Seated on blue ball at //-bars Personal assistant): TrA contraction with proper lumbar spine posture/marching/ alt. Hand and contralateral knee raises 2x10 each. //-bar for UE support/balance.  Added 2# bicep curls/ press-ups/ punches 20x each.  Good understands/ technique with lumbar stab. Ex.    Reviewed HEP  Manual tx:  Prone Grade II-III PA mobs. To low thoracic and lumbar spine (unilateral/ central)- 3x20 sec. Minimal tenderness reported in lumbar paraspinals today.    Prone STM to lumbar paraspinals (added use of Hypervolt).      PATIENT EDUCATION:  Education details: Access Code: [email or web address] Person educated: Patient Education method: Explanation, Demonstration, and Handouts Education comprehension: verbalized understanding and returned demonstration  HOME EXERCISE PROGRAM: Access Code: [email or web address] URL:  [email or web address] Date: 04/12/2022 Prepared by: Dorcas Carrow  Exercises - Supine Lower Trunk Rotation  - 2 x daily - 7 x weekly - 1 sets - 3 reps - Hooklying Single Knee to Chest Stretch  - 2 x daily - 7 x weekly - 1 sets - 3 reps - Seated Hamstring Stretch  - 2 x daily - 7 x weekly - 1 sets - 3 reps - 20 seconds hold - Standing Hamstring Stretch with Step  - 2 x daily - 7 x weekly - 1 sets - 3 reps - 20 seconds hold - Standing Lumbar Extension  - 2 x daily - 7 x weekly - 1 sets - 5 reps  Access Code: [email or web address] URL: [email or web address] Date: 04/27/2022 Prepared by: Dorcas Carrow  Exercises - Supine Lower Trunk Rotation  - 2 x daily - 7 x weekly - 1 sets - 3 reps - Hooklying Single Knee to Chest Stretch  - 2 x daily - 7 x weekly - 1 sets - 3 reps - Seated Hamstring Stretch  -  2 x daily - 7 x weekly - 1 sets - 3 reps - 20 seconds hold - Standing Hamstring Stretch with Step  - 2 x daily - 7 x weekly - 1 sets - 3 reps - 20 seconds hold - Standing Lumbar Extension  - 2 x daily - 7 x weekly - 1 sets - 5 reps - Supine Posterior Pelvic Tilt  - 1 x daily - 7 x weekly - 1 sets - 10 reps - Dead Bug  - 1 x daily - 7 x weekly - 1 sets - 20 reps - Supine Bridge  - 1 x daily - 7 x weekly - 1 sets - 10 reps  ASSESSMENT:  CLINICAL IMPRESSION: Pt. presents with 0-1/10 low back pain with no radicular symptoms.  PT reviewed pts. HEP and pt. demonstrates independence and good understanding of HEP. PT continued progression of TrA exercises during today's treatment to continue strengthening pt.'s core and remove pressure from her lumbar spine.  Pt. Presented with slight hypomobility and pt. Reported tenderness at L4-L5 but the pain improved with repeated CPA's.  Pt. Required no UE support and had no instances of LOB. Discharge from PT at this time with focus on a consistent HEP.       OBJECTIVE IMPAIRMENTS: decreased activity tolerance, decreased endurance, decreased  mobility, decreased ROM, decreased strength, hypomobility, impaired flexibility, improper body mechanics, postural dysfunction, obesity, and pain.   ACTIVITY LIMITATIONS: carrying, lifting, bending, sitting, standing, squatting, transfers, bed mobility, and locomotion level  PARTICIPATION LIMITATIONS: cleaning, laundry, driving, shopping, community activity, and occupation  PERSONAL FACTORS: Age and Past/current experiences are also affecting patient's functional outcome.   REHAB POTENTIAL: Good  CLINICAL DECISION MAKING: Stable/uncomplicated  EVALUATION COMPLEXITY: Low   GOALS: Goals reviewed with patient? Yes  SHORT TERM GOALS: Target date: 04/26/22  Pt. Independent with HEP to increase lumbar ROM/ core stability to improve pain-free mobility.  Baseline:  see above Goal status: Goal met   LONG TERM GOALS: Target date: 05/10/22  Pt. Will increase FOTO to 71 to improve pain-free mobility.   Baseline:  initial FOTO 49.  1/17: 82 goal met Goal status: Goal met  2.  Pt. Will report no low back pain with rolling over/ bed mobility to improve pain-free mobility.  Baseline: increase in R glut/ low back symptoms getting off mat table Goal status: Goal met  3.  Pt. Will report no increase in low back pain/ R LE radicular symptoms with daily household chores/ work.   Baseline: See above Goal status: Goal met   PLAN:  PT FREQUENCY: 1x   PLANNED INTERVENTIONS: Therapeutic exercises, Therapeutic activity, Neuromuscular re-education, Patient/Family education, Self Care, Joint mobilization, Joint manipulation, Dry Needling, Electrical stimulation, Spinal manipulation, Spinal mobilization, Cryotherapy, Moist heat, Manual therapy, and Re-evaluation.  PLAN FOR NEXT SESSION:  Discharge from PT at this time.  Pt. Has f/u with Dr. Zigmund Daniel in 2 weeks.     Pura Spice, PT, DPT # [phone] 06/29/2022, 9:00 AM

## 2022-07-04 ENCOUNTER — Other Ambulatory Visit: Payer: Self-pay

## 2022-07-04 ENCOUNTER — Encounter: Payer: Self-pay | Admitting: Family Medicine

## 2022-07-04 DIAGNOSIS — K219 Gastro-esophageal reflux disease without esophagitis: Secondary | ICD-10-CM

## 2022-07-04 MED ORDER — OMEPRAZOLE 40 MG PO CPDR: 40.0000 mg | DELAYED_RELEASE_CAPSULE | Freq: Every day | ORAL | 0 refills | Status: DC | PRN

## 2022-07-11 ENCOUNTER — Encounter: Payer: Self-pay | Admitting: Family Medicine

## 2022-07-11 ENCOUNTER — Ambulatory Visit: Payer: 59 | Admitting: Family Medicine

## 2022-07-11 VITALS — BP 130/80 | HR 100 | Ht 64.0 in

## 2022-07-11 DIAGNOSIS — M5417 Radiculopathy, lumbosacral region: Secondary | ICD-10-CM

## 2022-07-25 NOTE — Assessment & Plan Note (Signed)
Chronic, interval improvement after completion of physical therapy course.  Exam findings with interval improved, though still persistent, right-sided radicular features.  Positive straight leg raise.  Plan as follows: - Continue home exercises on a regular/maintenance basis - Can dose diclofenac and cyclobenzaprine on an as-needed basis - If symptoms recur/worsen, advanced imaging and referral to spine group to be considered

## 2022-07-25 NOTE — Progress Notes (Signed)
     Primary Care / Sports Medicine Office Visit  Patient Information:  Patient ID: Eileen Vargas, female DOB: [birth year] Age: 32 y.o. MRN: [id number]   Eileen Vargas is a pleasant 32 y.o. female presenting with the following:  Chief Complaint  Patient presents with   Right lumbosacral radiculopathy    Vitals:   07/11/22 1504  BP: 130/80  Pulse: 100  SpO2: 98%   Vitals:   07/11/22 1504  Height: 5\' 4"  (1.626 m)   Body mass index is 46 kg/m.  No results found.   Independent interpretation of notes and tests performed by another provider:   None  Procedures performed:   None  Pertinent History, Exam, Impression, and Recommendations:   Eileen Vargas was seen today for right lumbosacral radiculopathy.  Right lumbosacral radiculopathy Assessment & Plan: Chronic, interval improvement after completion of physical therapy course.  Exam findings with interval improved, though still persistent, right-sided radicular features.  Positive straight leg raise.  Plan as follows: - Continue home exercises on a regular/maintenance basis - Can dose diclofenac and cyclobenzaprine on an as-needed basis - If symptoms recur/worsen, advanced imaging and referral to spine group to be considered      Orders & Medications No orders of the defined types were placed in this encounter.  No orders of the defined types were placed in this encounter.    Return in about 3 months (around 10/11/2022) for CPE.     Montel Culver, MD, [facility]   Primary Care Sports Medicine Primary Care and Sports Medicine at [facility]

## 2022-08-09 ENCOUNTER — Ambulatory Visit: Payer: 59 | Admitting: Obstetrics and Gynecology

## 2022-08-09 DIAGNOSIS — D219 Benign neoplasm of connective and other soft tissue, unspecified: Secondary | ICD-10-CM

## 2022-08-22 ENCOUNTER — Other Ambulatory Visit: Payer: Self-pay

## 2022-08-22 DIAGNOSIS — D500 Iron deficiency anemia secondary to blood loss (chronic): Secondary | ICD-10-CM

## 2022-08-23 ENCOUNTER — Observation Stay
Admission: EM | Admit: 2022-08-23 | Discharge: 2022-08-24 | Disposition: A | Discharge status: Home / Self Care | Payer: 59 | Attending: Osteopathic Medicine | Admitting: Osteopathic Medicine

## 2022-08-23 ENCOUNTER — Telehealth: Payer: Self-pay | Admitting: *Deleted

## 2022-08-23 ENCOUNTER — Other Ambulatory Visit: Payer: Self-pay

## 2022-08-23 ENCOUNTER — Inpatient Hospital Stay: Payer: 59 | Attending: Nurse Practitioner

## 2022-08-23 DIAGNOSIS — F419 Anxiety disorder, unspecified: Secondary | ICD-10-CM | POA: Diagnosis present

## 2022-08-23 DIAGNOSIS — D509 Iron deficiency anemia, unspecified: Secondary | ICD-10-CM | POA: Diagnosis not present

## 2022-08-23 DIAGNOSIS — D649 Anemia, unspecified: Secondary | ICD-10-CM | POA: Diagnosis present

## 2022-08-23 DIAGNOSIS — D219 Benign neoplasm of connective and other soft tissue, unspecified: Secondary | ICD-10-CM | POA: Diagnosis present

## 2022-08-23 DIAGNOSIS — N920 Excessive and frequent menstruation with regular cycle: Secondary | ICD-10-CM | POA: Insufficient documentation

## 2022-08-23 DIAGNOSIS — F32A Depression, unspecified: Secondary | ICD-10-CM | POA: Diagnosis present

## 2022-08-23 DIAGNOSIS — D500 Iron deficiency anemia secondary to blood loss (chronic): Secondary | ICD-10-CM | POA: Diagnosis not present

## 2022-08-23 DIAGNOSIS — D259 Leiomyoma of uterus, unspecified: Secondary | ICD-10-CM | POA: Diagnosis present

## 2022-08-23 LAB — CBC
HCT: 18.7 % — ABNORMAL LOW (ref 36.0–46.0)
HCT: 20.4 % — ABNORMAL LOW (ref 36.0–46.0)
Hemoglobin: 5 g/dL — ABNORMAL LOW (ref 12.0–15.0)
Hemoglobin: 6 g/dL — ABNORMAL LOW (ref 12.0–15.0)
MCH: 17.2 pg — ABNORMAL LOW (ref 26.0–34.0)
MCH: 19.7 pg — ABNORMAL LOW (ref 26.0–34.0)
MCHC: 26.7 g/dL — ABNORMAL LOW (ref 30.0–36.0)
MCHC: 29.4 g/dL — ABNORMAL LOW (ref 30.0–36.0)
MCV: 64.3 fL — ABNORMAL LOW (ref 80.0–100.0)
MCV: 66.9 fL — ABNORMAL LOW (ref 80.0–100.0)
Platelets: 320 10*3/uL (ref 150–400)
Platelets: 313 10*3/uL (ref 150–400)
RBC: 2.91 MIL/uL — ABNORMAL LOW (ref 3.87–5.11)
RBC: 3.05 MIL/uL — ABNORMAL LOW (ref 3.87–5.11)
RDW: 22.7 % — ABNORMAL HIGH (ref 11.5–15.5)
RDW: 24 % — ABNORMAL HIGH (ref 11.5–15.5)
WBC: 5.5 10*3/uL (ref 4.0–10.5)
WBC: 6.3 10*3/uL (ref 4.0–10.5)
nRBC: 0.5 % — ABNORMAL HIGH (ref 0.0–0.2)
nRBC: 0.3 % — ABNORMAL HIGH (ref 0.0–0.2)

## 2022-08-23 LAB — CBC WITH DIFFERENTIAL (CANCER CENTER ONLY)
Abs Immature Granulocytes: 0.03 10*3/uL (ref 0.00–0.07)
Basophils Absolute: 0 10*3/uL (ref 0.0–0.1)
Basophils Relative: 1 %
Eosinophils Absolute: 0.1 10*3/uL (ref 0.0–0.5)
Eosinophils Relative: 1 %
HCT: 18.8 % — ABNORMAL LOW (ref 36.0–46.0)
Hemoglobin: 5.2 g/dL — CL (ref 12.0–15.0)
Immature Granulocytes: 1 %
Lymphocytes Relative: 38 %
Lymphs Abs: 2.4 10*3/uL (ref 0.7–4.0)
MCH: 17.5 pg — ABNORMAL LOW (ref 26.0–34.0)
MCHC: 27.7 g/dL — ABNORMAL LOW (ref 30.0–36.0)
MCV: 63.3 fL — ABNORMAL LOW (ref 80.0–100.0)
Monocytes Absolute: 0.5 10*3/uL (ref 0.1–1.0)
Monocytes Relative: 8 %
Neutro Abs: 3.2 10*3/uL (ref 1.7–7.7)
Neutrophils Relative %: 51 %
Platelet Count: 371 10*3/uL (ref 150–400)
RBC: 2.97 MIL/uL — ABNORMAL LOW (ref 3.87–5.11)
RDW: 22.5 % — ABNORMAL HIGH (ref 11.5–15.5)
WBC Count: 6.2 10*3/uL (ref 4.0–10.5)
nRBC: 0.8 % — ABNORMAL HIGH (ref 0.0–0.2)

## 2022-08-23 LAB — BASIC METABOLIC PANEL
Anion gap: 9 (ref 5–15)
BUN: 11 mg/dL (ref 6–20)
CO2: 23 mmol/L (ref 22–32)
Calcium: 8.9 mg/dL (ref 8.9–10.3)
Chloride: 104 mmol/L (ref 98–111)
Creatinine, Ser: 0.84 mg/dL (ref 0.44–1.00)
GFR, Estimated: >60 mL/min (ref >60)
Glucose, Bld: 104 mg/dL — ABNORMAL HIGH (ref 70–99)
Potassium: 3.6 mmol/L (ref 3.5–5.1)
Sodium: 136 mmol/L (ref 135–145)

## 2022-08-23 LAB — IRON AND TIBC
Iron: 15 ug/dL — ABNORMAL LOW (ref 28–170)
Saturation Ratios: 3 % — ABNORMAL LOW (ref 10.4–31.8)
TIBC: 528 ug/dL — ABNORMAL HIGH (ref 250–450)
UIBC: 513 ug/dL

## 2022-08-23 LAB — TYPE AND SCREEN
ABO/RH(D): B POS
Antibody Screen: NEGATIVE
Unit division: 0

## 2022-08-23 LAB — PREPARE RBC (CROSSMATCH)

## 2022-08-23 LAB — BPAM RBC

## 2022-08-23 LAB — FERRITIN: Ferritin: 3 ng/mL — ABNORMAL LOW (ref 11–307)

## 2022-08-23 LAB — ABO/RH: ABO/RH(D): B POS

## 2022-08-23 MED ORDER — SODIUM CHLORIDE 0.9% FLUSH
3.0000 mL | Freq: Two times a day (BID) | INTRAVENOUS | Status: DC
  Administered 2022-08-23: 3 mL via INTRAVENOUS

## 2022-08-23 MED ORDER — SODIUM CHLORIDE 0.9 % IV SOLN
10.0000 mL/h | Freq: Once | INTRAVENOUS | Status: AC
  Administered 2022-08-24: 10 mL/h via INTRAVENOUS

## 2022-08-23 MED ORDER — CITALOPRAM HYDROBROMIDE 20 MG PO TABS
20.0000 mg | ORAL_TABLET | Freq: Every day | ORAL | Status: DC
  Administered 2022-08-24: 20 mg via ORAL
  Filled 2022-08-23: qty 1

## 2022-08-23 MED ORDER — MORPHINE SULFATE (PF) 2 MG/ML IV SOLN: 2.0000 mg | INTRAVENOUS | Status: DC | PRN

## 2022-08-23 MED ORDER — ACETAMINOPHEN 325 MG PO TABS: 650.0000 mg | ORAL_TABLET | Freq: Four times a day (QID) | ORAL | Status: DC | PRN

## 2022-08-23 MED ORDER — ACETAMINOPHEN 650 MG RE SUPP: 650.0000 mg | Freq: Four times a day (QID) | RECTAL | Status: DC | PRN

## 2022-08-23 MED ORDER — SODIUM CHLORIDE 0.9 % IV SOLN
10.0000 mL/h | Freq: Once | INTRAVENOUS | Status: AC
  Administered 2022-08-23: 10 mL/h via INTRAVENOUS

## 2022-08-23 NOTE — Assessment & Plan Note (Signed)
Continue citalopram. 

## 2022-08-23 NOTE — Assessment & Plan Note (Signed)
Pt has uterine fibroid and menorrhagia.  Pt needs GYN followup for OCP options to prevent future heavy menses.

## 2022-08-23 NOTE — ED Notes (Signed)
Pt to ED with partner for weakness, dizziness since February. Labs drawn in triage show hgb 5.0. Pt has uterine fibroids, denies obvious blood loss from other source. States also gets iron infusions since 2023.  States weakness has progressed to the point over last 2 months that she gets out of breath and HR goes up when walking up 14 stairs. Conjunctiva is pale.

## 2022-08-23 NOTE — Telephone Encounter (Signed)
1327- Returned phone call to Selena Batten in cancer center. Critical value hgb 5.2.  read back process performed with lab tech.  RN spoke with Eileen Shames NP at 1328 08/23/22. Critical value given to APP Read back processed performed with NP  Eileen Shames, NP spoke with patient. NP Spoke to patient- pt will go to ER.

## 2022-08-23 NOTE — Assessment & Plan Note (Signed)
Type/ cross. Transfuse 2 units with goal hb of 8.  Ob/ GYN referral.  Education on AVOIDING VOLTAREN/ AND ALL NSAIDS.  STARTING IV PPI AND TO CONT ON DISCHARGE.

## 2022-08-23 NOTE — Assessment & Plan Note (Signed)
>>  ASSESSMENT AND PLAN FOR FIBROID WRITTEN ON 08/23/2022 10:00 PM BY PATEL, EKTA V, MD  Pt has uterine fibroid and menorrhagia.  Pt needs GYN followup for OCP options to prevent future heavy menses.

## 2022-08-23 NOTE — Telephone Encounter (Signed)
RN spoke with triage in ER for hand off of care. Pt being sent to ER for critical hgb 5.2. pt will need a blood transfusion. patient has a h/o fibroids/heavy menses.

## 2022-08-23 NOTE — ED Triage Notes (Signed)
Pt to ED from cancer center for hgb 5.2. pt denies any bleeding or dark stools.

## 2022-08-23 NOTE — H&P (Signed)
History and Physical     Patient: Eileen Vargas ZOX:[id number] DOB: 1990-05-03 DOA: 08/23/2022 DOS: the patient was seen and examined on 08/24/2022 PCP: Jerrol Banana, MD   Patient coming from: Home  Chief Complaint: Symptomatic anemia.  HISTORY OF PRESENT ILLNESS: Eileen Vargas is an 32 y.o. female seen today for low blood count.  Patient was at the cancer where she had CBC done prior to her iron infusion.  And was found to have a hemoglobin of 5.2.  Patient does report feeling fatigued dizzy and weak since February.  Patient does have a history of fibroids and no longer takes diclofenac.  Patient has been trying to decide what kind of plan that she would pursue as far as options for fibroids discussed with patient that she needs to establish with GYN as soon as possible. Long standing anemia even when treated puts strain on the heart .  Past Medical History:  Diagnosis Date   Anemia    Review of Systems  Constitutional:  Positive for fatigue.  Neurological:  Positive for dizziness.  All other systems reviewed and are negative.  No Known Allergies Past Surgical History:  Procedure Laterality Date   CHOLECYSTECTOMY  2013   MEDICATIONS: Prior to Admission medications   Medication Sig Start Date End Date Taking? Authorizing Provider  citalopram (CELEXA) 20 MG tablet TAKE 1 TABLET BY MOUTH EVERY DAY 06/28/22   Jerrol Banana, MD  cyclobenzaprine (FLEXERIL) 10 MG tablet Take 1 tablet (10 mg total) by mouth 3 (three) times daily as needed for muscle spasms. Patient not taking: Reported on 07/11/2022 03/29/22   Jerrol Banana, MD  diclofenac (VOLTAREN) 50 MG EC tablet TAKE 1 TABLET (50 MG TOTAL) BY MOUTH 2 (TWO) TIMES DAILY. X 2 WEEKS THEN TWICE DAILY AS-NEEDED Patient not taking: Reported on 07/11/2022 05/06/22   Jerrol Banana, MD  Iron Sucrose (VENOFER IV) Inject 200 mg into the vein every 7 (seven) days.    [provider]  Iron-Vitamin C (VITRON-C) 65-125 MG TABS  Take 1 tablet by mouth daily. 02/15/22   Alinda Dooms, NP  omeprazole (PRILOSEC) 40 MG capsule Take 1 capsule (40 mg total) by mouth daily as needed. 07/04/22   Jerrol Banana, MD    citalopram  20 mg Oral Daily   sodium chloride flush  3 mL Intravenous Q12H     sodium chloride     ED Course: Pt in Ed alert awake oriented afebrile O2 sats of 100% on room air.  Vitals:   08/23/22 2230 08/23/22 2245 08/23/22 2300 08/24/22 0000  BP: (!) 143/87 (!) 141/75 126/80 (!) 141/81  Pulse: 78 81 77 81  Resp: 17 19 (!) 26 18  Temp:    98.6 F (37 C)  TempSrc:    Oral  SpO2: 100% 100% 100% 99%  Weight:      Height:       Total I/O In: 310 [I.V.:310] Out: -  SpO2: 99 % Blood work in ed shows: CMP shows glucose of 104. Iron panel shows elevated TIBC at 528 and a serum iron of 15 ferritin of 3 consistent with iron deficiency. CBC shows anemia with a hemoglobin of 5.2.  Platelet count of 313 white count of 6.3. Results for orders placed or performed during the hospital encounter of 08/23/22 (from the past 72 hour(s))  CBC     Status: Abnormal   Collection Time: 08/23/22  2:14 PM  Result Value Ref Range   WBC 5.5  4.0 - 10.5 K/uL   RBC 2.91 (L) 3.87 - 5.11 MIL/uL   Hemoglobin 5.0 (L) 12.0 - 15.0 g/dL    Comment: Reticulocyte Hemoglobin testing may be clinically indicated, consider ordering this additional test [id number]    HCT 18.7 (L) 36.0 - 46.0 %   MCV 64.3 (L) 80.0 - 100.0 fL   MCH 17.2 (L) 26.0 - 34.0 pg   MCHC 26.7 (L) 30.0 - 36.0 g/dL   RDW 54.0 (H) 98.1 - 19.1 %   Platelets 320 150 - 400 K/uL   nRBC 0.5 (H) 0.0 - 0.2 %    Comment: Performed at [facility], [address], [place], [place] [address]  Basic metabolic panel     Status: Abnormal   Collection Time: 08/23/22  2:14 PM  Result Value Ref Range   Sodium 136 135 - 145 mmol/L   Potassium 3.6 3.5 - 5.1 mmol/L   Chloride 104 98 - 111 mmol/L   CO2 23 22 - 32 mmol/L   Glucose, Bld 104 (H) 70 - 99 mg/dL     Comment: Glucose reference range applies only to samples taken after fasting for at least 8 hours.   BUN 11 6 - 20 mg/dL   Creatinine, Ser 5.62 0.44 - 1.00 mg/dL   Calcium 8.9 8.9 - 13.0 mg/dL   GFR, Estimated >86 >57 mL/min    Comment: (NOTE) Calculated using the CKD-EPI Creatinine Equation (2021)    Anion gap 9 5 - 15    Comment: Performed at [facility], [address] [place], [place] [address]  Type and screen [facility]     Status: None (Preliminary result)   Collection Time: 08/23/22  2:14 PM  Result Value Ref Range   ABO/RH(D) B POS    Antibody Screen NEG    Sample Expiration 08/26/2022,2359    Unit Number [id number]    Blood Component Type RED CELLS,LR    Unit division 00    Status of Unit ISSUED    Transfusion Status OK TO TRANSFUSE    Crossmatch Result Compatible    Unit Number [id number]    Blood Component Type RED CELLS,LR    Unit division 00    Status of Unit ISSUED    Transfusion Status OK TO TRANSFUSE    Crossmatch Result      Compatible Performed at [facility], [address], [place], [place] [address]   Prepare RBC (crossmatch)     Status: None   Collection Time: 08/23/22  3:27 PM  Result Value Ref Range   Order Confirmation      ORDER PROCESSED BY BLOOD BANK Performed at [facility], [address] [place], [place] [address]   ABO/Rh     Status: None   Collection Time: 08/23/22  3:30 PM  Result Value Ref Range   ABO/RH(D)      B POS Performed at [facility], [address] [place], [place] [address]   CBC     Status: Abnormal   Collection Time: 08/23/22  8:13 PM  Result Value Ref Range   WBC 6.3 4.0 - 10.5 K/uL   RBC 3.05 (L) 3.87 - 5.11 MIL/uL   Hemoglobin 6.0 (L) 12.0 - 15.0 g/dL    Comment: Reticulocyte Hemoglobin testing may be clinically indicated, consider ordering this additional test [id number]    HCT 20.4 (L) 36.0 - 46.0 %   MCV 66.9 (L) 80.0 - 100.0 fL    MCH 19.7 (  L) 26.0 - 34.0 pg   MCHC 29.4 (L) 30.0 - 36.0 g/dL   RDW 81.1 (H) 91.4 - 78.2 %   Platelets 313 150 - 400 K/uL   nRBC 0.3 (H) 0.0 - 0.2 %    Comment: Performed at [facility], [address] [place], [place] [address]  Prepare RBC (crossmatch)     Status: None (Preliminary result)   Collection Time: 08/23/22  9:33 PM  Result Value Ref Range   Order Confirmation PENDING     Lab Results  Component Value Date   CREATININE 0.84 08/23/2022   CREATININE 0.81 08/27/2021   CREATININE 0.77 07/13/2020      Latest Ref Rng & Units 08/23/2022    2:14 PM 08/27/2021    8:19 AM 07/13/2020    4:51 PM  CMP  Glucose 70 - 99 mg/dL 308  657  846   BUN 6 - 20 mg/dL 11  13  8    Creatinine 0.44 - 1.00 mg/dL 9.62  9.52  8.41   Sodium 135 - 145 mmol/L 136  137  141   Potassium 3.5 - 5.1 mmol/L 3.6  4.3  4.2   Chloride 98 - 111 mmol/L 104  102  103   CO2 22 - 32 mmol/L 23  20  20    Calcium 8.9 - 10.3 mg/dL 8.9  8.8  9.5   Total Protein 6.0 - 8.5 g/dL  7.4  7.2   Total Bilirubin 0.0 - 1.2 mg/dL  <3.2  <4.4   Alkaline Phos 44 - 121 IU/L  79  87   AST 0 - 40 IU/L  19  13   ALT 0 - 32 IU/L  24  12    Unresulted Labs (From admission, onward)     Start     Ordered   08/24/22 0500  Comprehensive metabolic panel  Tomorrow morning,   STAT        08/23/22 2204   08/24/22 0500  CBC  Tomorrow morning,   STAT        08/23/22 2204   08/23/22 2202  HIV Antibody (routine testing w rflx)  (HIV Antibody (Routine testing w reflex) panel)  Once,   URGENT        08/23/22 2204           Pt has received : Orders Placed This Encounter  Procedures   CBC    Standing Status:   Standing    Number of Occurrences:   1   Basic metabolic panel    Standing Status:   Standing    Number of Occurrences:   1   CBC    After transfusion    Standing Status:   Standing    Number of Occurrences:   1   HIV Antibody (routine testing w rflx)    Standing Status:   Standing    Number of Occurrences:   1    Comprehensive metabolic panel    Standing Status:   Standing    Number of Occurrences:   1   CBC    Standing Status:   Standing    Number of Occurrences:   1   Informed Consent Details: Physician/Practitioner Attestation; Transcribe to consent form and obtain patient signature    Standing Status:   Standing    Number of Occurrences:   1    Order Specific Question:   Physician/Practitioner attestation of informed consent for blood and or blood product transfusion    Answer:  I, the physician/practitioner, attest that I have discussed with the patient the benefits, risks, side effects, alternatives, likelihood of achieving goals and potential problems during recovery for the procedure that I have provided informed consent.    Order Specific Question:   Product(s)    Answer:   All Product(s)   Informed Consent Details: Physician/Practitioner Attestation; Transcribe to consent form and obtain patient signature    Standing Status:   Standing    Number of Occurrences:   1    Order Specific Question:   Physician/Practitioner attestation of informed consent for blood and or blood product transfusion    Answer:   I, the physician/practitioner, attest that I have discussed with the patient the benefits, risks, side effects, alternatives, likelihood of achieving goals and potential problems during recovery for the procedure that I have provided informed consent.    Order Specific Question:   Product(s)    Answer:   All Product(s)   Maintain IV access    Standing Status:   Standing    Number of Occurrences:   1   Vital signs    Standing Status:   Standing    Number of Occurrences:   1   Notify physician (specify)    Standing Status:   Standing    Number of Occurrences:   20    Order Specific Question:   Notify Physician    Answer:   for pulse less than 55 or greater than 120    Order Specific Question:   Notify Physician    Answer:   for respiratory rate less than 12 or greater than 25     Order Specific Question:   Notify Physician    Answer:   for temperature greater than 100.5 F    Order Specific Question:   Notify Physician    Answer:   for urinary output less than 30 mL/hr for four hours    Order Specific Question:   Notify Physician    Answer:   for systolic BP less than 90 or greater than 160, diastolic BP less than 60 or greater than 100    Order Specific Question:   Notify Physician    Answer:   for new hypoxia w/ oxygen saturations < 88%   Progressive Mobility Protocol: No Restrictions    Standing Status:   Standing    Number of Occurrences:   1   Daily weights    Standing Status:   Standing    Number of Occurrences:   1   Intake and Output    Standing Status:   Standing    Number of Occurrences:   1   Do not place and if present remove PureWick    Standing Status:   Standing    Number of Occurrences:   1   Initiate Oral Care Protocol    Standing Status:   Standing    Number of Occurrences:   1   Initiate Carrier Fluid Protocol    Standing Status:   Standing    Number of Occurrences:   1   RN may order General Admission PRN Orders utilizing "General Admission PRN medications" (through manage orders) for the following patient needs: allergy symptoms (Claritin), cold sores (Carmex), cough (Robitussin DM), eye irritation (Liquifilm Tears), hemorrhoids (Tucks), indigestion (Maalox), minor skin irritation (Hydrocortisone Cream), muscle pain (Ben Gay), nose irritation (saline nasal spray) and sore throat (Chloraseptic spray).    Standing Status:   Standing    Number of Occurrences:  [id number]   Cardiac Monitoring Continuous x 48 hours Indications for use: Other; Other indications for use: symptomatic anemia.    Standing Status:   Standing    Number of Occurrences:   1    Order Specific Question:   Indications for use:    Answer:   Other    Order Specific Question:   Other indications for use:    Answer:   symptomatic anemia.   Full code    Standing Status:    Standing    Number of Occurrences:   1    Order Specific Question:   By:    Answer:   Other   Consult to hospitalist  [phone]    Standing Status:   Standing    Number of Occurrences:   1    Order Specific Question:   Place call to:    Answer:   Hospitalist    Order Specific Question:   Reason for Consult    Answer:   Admit    Order Specific Question:   Diagnosis/Clinical Info for Consult:    Answer:   Symptomatic anemia   Pulse oximetry check with vital signs    Standing Status:   Standing    Number of Occurrences:   1   Oxygen therapy Mode or (Route): Nasal cannula; Liters Per Minute: 2; Keep 02 saturation: greater than 92 %    Standing Status:   Standing    Number of Occurrences:   20    Order Specific Question:   Mode or (Route)    Answer:   Nasal cannula    Order Specific Question:   Liters Per Minute    Answer:   2    Order Specific Question:   Keep 02 saturation    Answer:   greater than 92 %   Type and screen [facility]    [facility]     Standing Status:   Standing    Number of Occurrences:   1   Prepare RBC (crossmatch)    Standing Status:   Standing    Number of Occurrences:   1    Order Specific Question:   # of Units    Answer:   1 unit    Order Specific Question:   Transfusion Indications    Answer:   Hemoglobin < 7 gm/dL and symptomatic    Order Specific Question:   Number of Units to Keep Ahead    Answer:   NO units ahead    Order Specific Question:   If emergent release call blood bank    Answer:   Not emergent release   ABO/Rh    Standing Status:   Standing    Number of Occurrences:   1   Prepare RBC (crossmatch)    Standing Status:   Standing    Number of Occurrences:   1    Order Specific Question:   # of Units    Answer:   1 unit    Order Specific Question:   Transfusion Indications    Answer:   Hemoglobin < 7 gm/dL and symptomatic    Order Specific Question:   Number of Units to Keep Ahead     Answer:   NO units ahead    Order Specific Question:   If emergent release call blood bank    Answer:   Not emergent release   Place in observation (patient's expected length of stay will  be less than 2 midnights)    Standing Status:   Standing    Number of Occurrences:   1    Order Specific Question:   Hospital Area    Answer:   [facility] [[id number]]    Order Specific Question:   Level of Care    Answer:   Telemetry Medical [104]    Order Specific Question:   Covid Evaluation    Answer:   Asymptomatic - no recent exposure (last 10 days) testing not required    Order Specific Question:   Diagnosis    Answer:   Symptomatic anemia [0981191]    Order Specific Question:   Admitting Physician    Answer:   Darrold Junker    Order Specific Question:   Attending Physician    Answer:   Darrold Junker    Meds ordered this encounter  Medications   0.9 %  sodium chloride infusion   0.9 %  sodium chloride infusion   citalopram (CELEXA) tablet 20 mg   sodium chloride flush (NS) 0.9 % injection 3 mL   OR Linked Order Group    acetaminophen (TYLENOL) tablet 650 mg    acetaminophen (TYLENOL) suppository 650 mg   morphine (PF) 2 MG/ML injection 2 mg    Admission Imaging : No results found. Physical Examination: Vitals:   08/23/22 2230 08/23/22 2245 08/23/22 2300 08/24/22 0000  BP: (!) 143/87 (!) 141/75 126/80 (!) 141/81  Pulse: 78 81 77 81  Temp:    98.6 F (37 C)  Resp: 17 19 (!) 26 18  Height:      Weight:      SpO2: 100% 100% 100% 99%  TempSrc:    Oral  BMI (Calculated):       Physical Exam Vitals and nursing note reviewed.  Constitutional:      General: She is not in acute distress.    Appearance: Normal appearance. She is not ill-appearing, toxic-appearing or diaphoretic.  HENT:     Head: Normocephalic and atraumatic.     Right Ear: Hearing and external ear normal.     Left Ear: Hearing and external ear normal.     Nose: Nose normal. No  nasal deformity.     Mouth/Throat:     Lips: Pink.     Mouth: Mucous membranes are moist.     Tongue: No lesions.     Pharynx: Oropharynx is clear.  Eyes:     Extraocular Movements: Extraocular movements intact.  Cardiovascular:     Rate and Rhythm: Normal rate and regular rhythm.     Pulses: Normal pulses.     Heart sounds: Normal heart sounds.  Pulmonary:     Effort: Pulmonary effort is normal.     Breath sounds: Normal breath sounds.  Abdominal:     General: Bowel sounds are normal. There is no distension.     Palpations: Abdomen is soft. There is no mass.     Tenderness: There is no abdominal tenderness. There is no guarding.     Hernia: No hernia is present.  Musculoskeletal:     Right lower leg: No edema.     Left lower leg: No edema.  Skin:    General: Skin is warm.  Neurological:     General: No focal deficit present.     Mental Status: She is alert and oriented to person, place, and time.     Cranial Nerves: Cranial nerves 2-12 are intact.  Motor: Motor function is intact.  Psychiatric:        Attention and Perception: Attention normal.        Mood and Affect: Mood normal.        Speech: Speech normal.        Behavior: Behavior normal. Behavior is cooperative.        Cognition and Memory: Cognition normal.     Assessment and Plan: Fibroid Pt has uterine fibroid and menorrhagia.  Pt needs GYN followup for OCP options to prevent future heavy menses.    Anxiety and depression Continue citalopram.   Iron deficiency anemia Type/ cross. Transfuse 2 units with goal hb of 8.  Ob/ GYN referral.  Education on AVOIDING VOLTAREN/ AND ALL NSAIDS.  STARTING IV PPI AND TO CONT ON DISCHARGE.    DVT prophylaxis:  SCD's.  Code Status:  Full code.      08/23/2022    2:12 PM  Advanced Directives  Does Patient Have a Medical Advance Directive? No    Family Communication:  Spouse at bedside. Emergency Contact: Contact Information     Name Relation Home  Work [place], Marlene Bast Significant other   [phone]       Disposition Plan:  Home.  Consults: None.  Admission status: Observation. Unit / Expected LOS: Tele/ 1 day.  Gertha Calkin MD [facility]  6 PM- 2 AM. [phone]( Pager )  For questions regarding this patient please use [email or web address] to contact the current [facility] MD.   Bonita Quin may also call [phone] to contact current Assigned [facility] Attending/Consulting MD for this patient.

## 2022-08-23 NOTE — ED Provider Notes (Signed)
[facility] Provider Note    Event Date/Time   First MD Initiated Contact with Patient 08/23/22 1510     (approximate)   History   Anemia   HPI  Eileen Vargas is a 32 y.o. female with a history of iron deficiency anemia secondary to heavy menses, uterine fibroids, sickle cell trait, and hypertriglyceridemia who presents she was called and told that lab work from a few days ago showed anemia and that she needed a blood transfusion.  The patient states that she has been fatigued for the last few months since about February and has had shortness of breath on exertion but not at rest.  She denies any chest pain.  She has no abnormal bleeding or bruising besides heavy periods.  The patient states that her most recent menstrual period this month was fairly normal but she did have quite a heavy period in March.  She denies any black or tarry stools or other sources of bleeding.  I reviewed the past medical records.  The patient was most recently seen by hematology on 1/30 and at that time her hemoglobin was stable.  She had been receiving Venofer infusions.  She has no recent ED visits or hospitalizations.   Physical Exam   Triage Vital Signs: ED Triage Vitals  Enc Vitals Group     BP 08/23/22 1413 (!) 142/80     Pulse Rate 08/23/22 1411 (!) 105     Resp 08/23/22 1411 20     Temp 08/23/22 1411 99.1 F (37.3 C)     Temp src --      SpO2 08/23/22 1411 97 %     Weight 08/23/22 1412 260 lb (117.9 kg)     Height 08/23/22 1412 5\' 4"  (1.626 m)     Head Circumference --      Peak Flow --      Pain Score 08/23/22 1412 0     Pain Loc --      Pain Edu? --      Excl. in GC? --     Most recent vital signs: Vitals:   08/23/22 2245 08/23/22 2300  BP: (!) 141/75 126/80  Pulse: 81 77  Resp: 19 (!) 26  Temp:    SpO2: 100% 100%     General: Awake, no distress.  CV:  Good peripheral perfusion.  Resp:  Normal effort.  Abd:  No distention.  Other:  Conjunctival  pallor.   ED Results / Procedures / Treatments   Labs (all labs ordered are listed, but only abnormal results are displayed) Labs Reviewed  CBC - Abnormal; Notable for the following components:      Result Value   RBC 2.91 (*)    Hemoglobin 5.0 (*)    HCT 18.7 (*)    MCV 64.3 (*)    MCH 17.2 (*)    MCHC 26.7 (*)    RDW 22.7 (*)    nRBC 0.5 (*)    All other components within normal limits  BASIC METABOLIC PANEL - Abnormal; Notable for the following components:   Glucose, Bld 104 (*)    All other components within normal limits  CBC - Abnormal; Notable for the following components:   RBC 3.05 (*)    Hemoglobin 6.0 (*)    HCT 20.4 (*)    MCV 66.9 (*)    MCH 19.7 (*)    MCHC 29.4 (*)    RDW 24.0 (*)    nRBC 0.3 (*)  All other components within normal limits  HIV ANTIBODY (ROUTINE TESTING W REFLEX)  COMPREHENSIVE METABOLIC PANEL  CBC  TYPE AND SCREEN  PREPARE RBC (CROSSMATCH)  ABO/RH  PREPARE RBC (CROSSMATCH)     EKG    RADIOLOGY    PROCEDURES:  Critical Care performed: No  Procedures   MEDICATIONS ORDERED IN ED: Medications  0.9 %  sodium chloride infusion (has no administration in time range)  citalopram (CELEXA) tablet 20 mg (has no administration in time range)  sodium chloride flush (NS) 0.9 % injection 3 mL (has no administration in time range)  acetaminophen (TYLENOL) tablet 650 mg (has no administration in time range)    Or  acetaminophen (TYLENOL) suppository 650 mg (has no administration in time range)  morphine (PF) 2 MG/ML injection 2 mg (has no administration in time range)  0.9 %  sodium chloride infusion (0 mL/hr Intravenous Stopped 08/23/22 2010)     IMPRESSION / MDM / ASSESSMENT AND PLAN / ED COURSE  I reviewed the triage vital signs and the nursing notes.  32 year old female with a history of heavy menses and iron deficiency anemia presents with worsened anemia, with a current hemoglobin of 5.  The patient reports generalized  fatigue, weakness, and some shortness of breath on exertion.  Physical exam is unremarkable for acute findings.  Differential diagnosis includes, but is not limited to, exacerbation of chronic anemia, likely due to heavy menses.  There is no clinical evidence for GI bleed or other source of acute bleeding.  The patient's vital signs are stable and she is overall well-appearing.  Labs here confirm hemoglobin of 5.0, MCV of 64, and normal platelets and WBC count.  Electrolytes are normal.  We will give blood and reassess.  I anticipate that the patient may be able to go home if she responds well to transfusion.  Patient's presentation is most consistent with acute presentation with potential threat to life or bodily function.  The patient is on the cardiac monitor to evaluate for evidence of arrhythmia and/or significant heart rate changes.   ----------------------------------------- 9:48 PM on 08/23/2022 -----------------------------------------  After the first unit of PRBCs the patient's hemoglobin is now 6.0.  She will need additional blood and therefore inpatient management.  I therefore consulted Dr. Allena Katz from the hospitalist service; based on our discussion she agreed to evaluate the patient for admission.  FINAL CLINICAL IMPRESSION(S) / ED DIAGNOSES   Final diagnoses:  Iron deficiency anemia due to chronic blood loss     Rx / DC Orders   ED Discharge Orders     None        Note:  This document was prepared using Dragon voice recognition software and may include unintentional dictation errors.    Dionne Bucy, MD 08/23/22 2326

## 2022-08-24 ENCOUNTER — Encounter: Payer: Self-pay | Admitting: Internal Medicine

## 2022-08-24 ENCOUNTER — Other Ambulatory Visit: Payer: Self-pay | Admitting: Nurse Practitioner

## 2022-08-24 DIAGNOSIS — D500 Iron deficiency anemia secondary to blood loss (chronic): Secondary | ICD-10-CM

## 2022-08-24 DIAGNOSIS — D649 Anemia, unspecified: Secondary | ICD-10-CM | POA: Diagnosis not present

## 2022-08-24 LAB — COMPREHENSIVE METABOLIC PANEL
ALT: 15 U/L (ref 0–44)
AST: 14 U/L — ABNORMAL LOW (ref 15–41)
Albumin: 3.5 g/dL (ref 3.5–5.0)
Alkaline Phosphatase: 60 U/L (ref 38–126)
Anion gap: 3 — ABNORMAL LOW (ref 5–15)
BUN: 11 mg/dL (ref 6–20)
CO2: 25 mmol/L (ref 22–32)
Calcium: 8.4 mg/dL — ABNORMAL LOW (ref 8.9–10.3)
Chloride: 109 mmol/L (ref 98–111)
Creatinine, Ser: 0.77 mg/dL (ref 0.44–1.00)
GFR, Estimated: >60 mL/min (ref >60)
Glucose, Bld: 107 mg/dL — ABNORMAL HIGH (ref 70–99)
Potassium: 3.4 mmol/L — ABNORMAL LOW (ref 3.5–5.1)
Sodium: 137 mmol/L (ref 135–145)
Total Bilirubin: 0.5 mg/dL (ref 0.3–1.2)
Total Protein: 7.1 g/dL (ref 6.5–8.1)

## 2022-08-24 LAB — BPAM RBC
Blood Product Expiration Date: 202405252359
Blood Product Expiration Date: 202405252359
ISSUE DATE / TIME: 202404301726
ISSUE DATE / TIME: 202404302359
Unit Type and Rh: 7300
Unit Type and Rh: 7300

## 2022-08-24 LAB — TYPE AND SCREEN: Unit division: 0

## 2022-08-24 LAB — CBC
HCT: 24.8 % — ABNORMAL LOW (ref 36.0–46.0)
Hemoglobin: 7.5 g/dL — ABNORMAL LOW (ref 12.0–15.0)
MCH: 20.9 pg — ABNORMAL LOW (ref 26.0–34.0)
MCHC: 30.2 g/dL (ref 30.0–36.0)
MCV: 69.1 fL — ABNORMAL LOW (ref 80.0–100.0)
Platelets: 293 10*3/uL (ref 150–400)
RBC: 3.59 MIL/uL — ABNORMAL LOW (ref 3.87–5.11)
RDW: 24.6 % — ABNORMAL HIGH (ref 11.5–15.5)
WBC: 6.4 10*3/uL (ref 4.0–10.5)
nRBC: 0.8 % — ABNORMAL HIGH (ref 0.0–0.2)

## 2022-08-24 LAB — HIV ANTIBODY (ROUTINE TESTING W REFLEX): HIV Screen 4th Generation wRfx: NONREACTIVE

## 2022-08-24 MED ORDER — SODIUM CHLORIDE 0.9 % IV SOLN
300.0000 mg | Freq: Once | INTRAVENOUS | Status: AC
  Administered 2022-08-24: 300 mg via INTRAVENOUS
  Filled 2022-08-24: qty 300

## 2022-08-24 NOTE — ED Notes (Signed)
Pt resting quietly in bed with eyes closed. Respirations even and unlabored. Call light within reach. No S/S of distress noted.  

## 2022-08-24 NOTE — Hospital Course (Signed)
Eileen Vargas is an 32 y.o. female seen today for low blood count.  Patient was at the cancer where she had CBC done prior to her iron infusion.  And was found to have a hemoglobin of 5.2.  Patient does report feeling fatigued dizzy and weak since February.  Patient does have a history of fibroids and no longer takes diclofenac.  Patient has been trying to decide what kind of plan that she would pursue as far as options for fibroids. Admitted to hospitalist service, received 2 units PRBC and iron infusion. Referral to OBGYN   Consultants:  none  Procedures: none      ASSESSMENT & PLAN:   Principal Problem:   Symptomatic anemia Active Problems:   Iron deficiency anemia   Anxiety and depression   Fibroid  Symptomatic iron deficiency anemia, acute on chronic D/t heavy menstrual bleeding, fibroid uterus  Continue iron outpatient OBGYN referral made on discharge   Anxiety/depression Continue home meds

## 2022-08-24 NOTE — ED Notes (Signed)
Report received, this RN now assuming care.  

## 2022-08-24 NOTE — ED Notes (Signed)
AVS provided to and discussed with patient and significant other. Pt verbalizes understanding of discharge instructions and denies any questions or concerns at this time. Pt has ride home. Pt ambulated out of department independently with steady gait.

## 2022-08-24 NOTE — ED Notes (Signed)
Patient tolerating blood transfusion well. Patient denies any discomfort or reaction at this time.

## 2022-08-24 NOTE — Discharge Summary (Signed)
Physician Discharge Summary   Patient: Eileen Vargas MRN: [id number]  DOB: 1990/08/20   Admit:     Date of Admission: 08/23/2022 Admitted from: home   Discharge: Date of discharge: 08/24/22 Disposition: Home Condition at discharge: good  CODE STATUS: FULL CODE     Discharge Physician: Sunnie Nielsen, DO [facility]     PCP: Jerrol Banana, MD  Recommendations for Outpatient Follow-up:  Follow up with PCP Jerrol Banana, MD in 1-2 weeks for recheck CBC Need to establish w/ OBGYN to address fibroid / AUB Please obtain labs/tests: CBC, consider repeat iron studies as well in 1-2 weeks Please follow up on the following pending results: none PCP AND OTHER OUTPATIENT PROVIDERS: SEE BELOW FOR SPECIFIC DISCHARGE INSTRUCTIONS PRINTED FOR PATIENT IN ADDITION TO GENERIC AVS PATIENT INFO     Discharge Instructions     Call MD for:  extreme fatigue   Complete by: As directed    Call MD for:  persistant dizziness or light-headedness   Complete by: As directed    Diet - low sodium heart healthy   Complete by: As directed    Discharge instructions   Complete by: As directed    Please follow up ASAP w/ OBGYN to address bleeding / fibroids!   Increase activity slowly   Complete by: As directed          Discharge Diagnoses: Principal Problem:   Symptomatic anemia Active Problems:   Iron deficiency anemia   Anxiety and depression   Fibroid       Hospital Course: Eileen Vargas is an 32 y.o. female seen today for low blood count.  Patient was at the cancer where she had CBC done prior to her iron infusion.  And was found to have a hemoglobin of 5.2.  Patient does report feeling fatigued dizzy and weak since February.  Patient does have a history of fibroids and no longer takes diclofenac.  Patient has been trying to decide what kind of plan that she would pursue as far as options for fibroids. Admitted to hospitalist service, received 2 units PRBC and  iron infusion. Referral to OBGYN   Consultants:  none  Procedures: none      ASSESSMENT & PLAN:   Principal Problem:   Symptomatic anemia Active Problems:   Iron deficiency anemia   Anxiety and depression   Fibroid  Symptomatic iron deficiency anemia, acute on chronic D/t heavy menstrual bleeding, fibroid uterus  Continue iron outpatient OBGYN referral made on discharge   Anxiety/depression Continue home meds          Discharge Instructions  Allergies as of 08/24/2022   No Known Allergies      Medication List     TAKE these medications    citalopram 20 MG tablet Commonly known as: CELEXA TAKE 1 TABLET BY MOUTH EVERY DAY   omeprazole 40 MG capsule Commonly known as: PRILOSEC Take 1 capsule (40 mg total) by mouth daily as needed.   VENOFER IV Inject 200 mg into the vein every 7 (seven) days.   Vitron-C 65-125 MG Tabs Generic drug: Iron-Vitamin C Take 1 tablet by mouth daily.          No Known Allergies   Subjective: pt feeling better this morning, ambulating well in the halls without assistance, no SOB, no CP   Discharge Exam: BP 128/82   Pulse 72   Temp 98.1 F (36.7 C) (Oral)   Resp 16   Ht 5\' 4"  (  1.626 m)   Wt 117.9 kg   SpO2 100%   BMI 44.63 kg/m  General: Pt is alert, awake, not in acute distress Cardiovascular: RRR, S1/S2 +, no rubs, no gallops Respiratory: CTA bilaterally, no wheezing, no rhonchi Abdominal: Soft, NT, ND, bowel sounds + Extremities: no edema, no cyanosis     The results of significant diagnostics from this hospitalization (including imaging, microbiology, ancillary and laboratory) are listed below for reference.     Microbiology: No results found for this or any previous visit (from the past 240 hour(s)).   Labs: BNP (last 3 results) No results for input(s): "BNP" in the last 8760 hours. Basic Metabolic Panel: Recent Labs  Lab 08/23/22 1414 08/24/22 0520  NA 136 137  K 3.6 3.4*  CL 104  109  CO2 23 25  GLUCOSE 104* 107*  BUN 11 11  CREATININE 0.84 0.77  CALCIUM 8.9 8.4*   Liver Function Tests: Recent Labs  Lab 08/24/22 0520  AST 14*  ALT 15  ALKPHOS 60  BILITOT 0.5  PROT 7.1  ALBUMIN 3.5   No results for input(s): "LIPASE", "AMYLASE" in the last 168 hours. No results for input(s): "AMMONIA" in the last 168 hours. CBC: Recent Labs  Lab 08/23/22 1304 08/23/22 1414 08/23/22 2013 08/24/22 0520  WBC 6.2 5.5 6.3 6.4  NEUTROABS 3.2  --   --   --   HGB 5.2* 5.0* 6.0* 7.5*  HCT 18.8* 18.7* 20.4* 24.8*  MCV 63.3* 64.3* 66.9* 69.1*  PLT 371 320 313 293   Cardiac Enzymes: No results for input(s): "CKTOTAL", "CKMB", "CKMBINDEX", "TROPONINI" in the last 168 hours. BNP: Invalid input(s): "POCBNP" CBG: No results for input(s): "GLUCAP" in the last 168 hours. D-Dimer No results for input(s): "DDIMER" in the last 72 hours. Hgb A1c No results for input(s): "HGBA1C" in the last 72 hours. Lipid Profile No results for input(s): "CHOL", "HDL", "LDLCALC", "TRIG", "CHOLHDL", "LDLDIRECT" in the last 72 hours. Thyroid function studies No results for input(s): "TSH", "T4TOTAL", "T3FREE", "THYROIDAB" in the last 72 hours.  Invalid input(s): "FREET3" Anemia work up Recent Labs    08/23/22 1304  FERRITIN 3*  TIBC 528*  IRON 15*   Urinalysis    Component Value Date/Time   COLORURINE YELLOW 05/29/2020 1625   APPEARANCEUR CLEAR 05/29/2020 1625   LABSPEC 1.020 05/29/2020 1625   PHURINE 6.0 05/29/2020 1625   GLUCOSEU NEGATIVE 05/29/2020 1625   HGBUR TRACE (A) 05/29/2020 1625   BILIRUBINUR NEGATIVE 05/29/2020 1625   KETONESUR NEGATIVE 05/29/2020 1625   PROTEINUR NEGATIVE 05/29/2020 1625   NITRITE NEGATIVE 05/29/2020 1625   LEUKOCYTESUR NEGATIVE 05/29/2020 1625   Sepsis Labs Recent Labs  Lab 08/23/22 1304 08/23/22 1414 08/23/22 2013 08/24/22 0520  WBC 6.2 5.5 6.3 6.4   Microbiology No results found for this or any previous visit (from the past 240  hour(s)). Imaging No results found.    Time coordinating discharge: over 30 minutes  SIGNED:  Sunnie Nielsen DO [facility]

## 2022-08-25 LAB — PREPARE RBC (CROSSMATCH)

## 2022-08-26 ENCOUNTER — Encounter: Payer: 59 | Admitting: Family Medicine

## 2022-08-26 ENCOUNTER — Other Ambulatory Visit: Payer: Self-pay

## 2022-08-26 DIAGNOSIS — D500 Iron deficiency anemia secondary to blood loss (chronic): Secondary | ICD-10-CM

## 2022-08-29 ENCOUNTER — Ambulatory Visit: Payer: 59 | Admitting: Oncology

## 2022-08-29 ENCOUNTER — Inpatient Hospital Stay: Payer: 59 | Attending: Oncology | Admitting: Nurse Practitioner

## 2022-08-29 ENCOUNTER — Ambulatory Visit: Payer: 59 | Admitting: Nurse Practitioner

## 2022-08-29 ENCOUNTER — Inpatient Hospital Stay: Payer: 59

## 2022-08-29 ENCOUNTER — Encounter: Payer: Self-pay | Admitting: Nurse Practitioner

## 2022-08-29 VITALS — BP 128/79 | HR 69 | Resp 16

## 2022-08-29 VITALS — BP 150/86 | HR 79 | Temp 98.6°F | Wt 255.0 lb

## 2022-08-29 DIAGNOSIS — D500 Iron deficiency anemia secondary to blood loss (chronic): Secondary | ICD-10-CM | POA: Diagnosis not present

## 2022-08-29 DIAGNOSIS — Z6841 Body Mass Index (BMI) 40.0 and over, adult: Secondary | ICD-10-CM | POA: Insufficient documentation

## 2022-08-29 DIAGNOSIS — Z801 Family history of malignant neoplasm of trachea, bronchus and lung: Secondary | ICD-10-CM | POA: Insufficient documentation

## 2022-08-29 DIAGNOSIS — D259 Leiomyoma of uterus, unspecified: Secondary | ICD-10-CM | POA: Diagnosis not present

## 2022-08-29 DIAGNOSIS — R5383 Other fatigue: Secondary | ICD-10-CM | POA: Insufficient documentation

## 2022-08-29 DIAGNOSIS — D573 Sickle-cell trait: Secondary | ICD-10-CM | POA: Diagnosis not present

## 2022-08-29 DIAGNOSIS — D509 Iron deficiency anemia, unspecified: Secondary | ICD-10-CM | POA: Insufficient documentation

## 2022-08-29 DIAGNOSIS — Z79899 Other long term (current) drug therapy: Secondary | ICD-10-CM | POA: Diagnosis not present

## 2022-08-29 DIAGNOSIS — K219 Gastro-esophageal reflux disease without esophagitis: Secondary | ICD-10-CM | POA: Diagnosis not present

## 2022-08-29 DIAGNOSIS — D508 Other iron deficiency anemias: Secondary | ICD-10-CM

## 2022-08-29 LAB — CBC WITH DIFFERENTIAL/PLATELET
Abs Immature Granulocytes: 0.03 10*3/uL (ref 0.00–0.07)
Basophils Absolute: 0.1 10*3/uL (ref 0.0–0.1)
Basophils Relative: 1 %
Eosinophils Absolute: 0.1 10*3/uL (ref 0.0–0.5)
Eosinophils Relative: 1 %
HCT: 27.7 % — ABNORMAL LOW (ref 36.0–46.0)
Hemoglobin: 8.3 g/dL — ABNORMAL LOW (ref 12.0–15.0)
Immature Granulocytes: 0 %
Lymphocytes Relative: 34 %
Lymphs Abs: 2.5 10*3/uL (ref 0.7–4.0)
MCH: 21.1 pg — ABNORMAL LOW (ref 26.0–34.0)
MCHC: 30 g/dL (ref 30.0–36.0)
MCV: 70.5 fL — ABNORMAL LOW (ref 80.0–100.0)
Monocytes Absolute: 0.5 10*3/uL (ref 0.1–1.0)
Monocytes Relative: 7 %
Neutro Abs: 4.2 10*3/uL (ref 1.7–7.7)
Neutrophils Relative %: 57 %
Platelets: 284 10*3/uL (ref 150–400)
RBC: 3.93 MIL/uL (ref 3.87–5.11)
RDW: 26.9 % — ABNORMAL HIGH (ref 11.5–15.5)
WBC: 7.4 10*3/uL (ref 4.0–10.5)
nRBC: 0.4 % — ABNORMAL HIGH (ref 0.0–0.2)

## 2022-08-29 LAB — FERRITIN: Ferritin: 55 ng/mL (ref 11–307)

## 2022-08-29 LAB — IRON AND TIBC
Iron: 29 ug/dL (ref 28–170)
Saturation Ratios: 6 % — ABNORMAL LOW (ref 10.4–31.8)
TIBC: 490 ug/dL — ABNORMAL HIGH (ref 250–450)
UIBC: 461 ug/dL

## 2022-08-29 LAB — SAMPLE TO BLOOD BANK

## 2022-08-29 MED ORDER — SODIUM CHLORIDE 0.9 % IV SOLN
200.0000 mg | Freq: Once | INTRAVENOUS | Status: AC
  Administered 2022-08-29: 200 mg via INTRAVENOUS
  Filled 2022-08-29: qty 200

## 2022-08-29 MED ORDER — SODIUM CHLORIDE 0.9 % IV SOLN
Freq: Once | INTRAVENOUS | Status: AC
  Filled 2022-08-29: qty 250

## 2022-08-29 MED ORDER — SODIUM CHLORIDE 0.9% FLUSH
10.0000 mL | Freq: Once | INTRAVENOUS | Status: AC | PRN
  Administered 2022-08-29: 10 mL
  Filled 2022-08-29: qty 10

## 2022-08-29 NOTE — Patient Instructions (Signed)

## 2022-08-29 NOTE — Progress Notes (Signed)
1610- Patient refused to stay the 30 minute post Venofer observation time period. Patient agreed to stay 10 minutes post Venofer infusion for observation. Vital signs and patient stable at discharge.

## 2022-08-29 NOTE — Progress Notes (Signed)
[facility]  [phone] [phone]  ID: Eileen Vargas OB: Jun 14, 1990  MR#: [id number]  AOZ#:[id number]  Patient Care Team: Jerrol Banana, MD as PCP - General (Family Medicine)  CHIEF COMPLAINT: Iron deficiency anemia  INTERVAL HISTORY: Patient is a 32 year old female with history of iron deficiency anemia secondary to heavy menses who returns to clinic for follow-up. In interim she had covid in February. Had ongoing fatigue and lethargy following. She came to clinic for lab check on 08/23/22 and was found to have hemoglobin 5.2. She was seen in ER, admitted, received 2 units of pRBCs and venofer 300 mg x 1. She has heavy uterine bleeding in setting of fibroids. Was previously referred to [facility] for management but she remains uncertain of what option she would like to take. Today, feels slightly better than last week but ongoing fatigue. Lethargy and increased heart rate with exertion has improved.    REVIEW OF SYSTEMS:   Review of Systems  Constitutional:  Positive for malaise/fatigue. Negative for chills, fever and weight loss.  Respiratory:  Positive for shortness of breath. Negative for cough.   Cardiovascular:  Negative for chest pain, palpitations, orthopnea and claudication.  Gastrointestinal:  Negative for abdominal pain, blood in stool, constipation, diarrhea, melena, nausea and vomiting.  Genitourinary:  Negative for frequency and hematuria.  Musculoskeletal:  Negative for back pain, joint pain, myalgias and neck pain.  Skin: Negative.   Neurological:  Negative for dizziness, tingling, weakness and headaches.  Endo/Heme/Allergies:  Does not bruise/bleed easily.  Psychiatric/Behavioral:  Negative for depression. The patient is not nervous/anxious.   As per HPI. Otherwise, a complete review of systems is negative.  PAST MEDICAL HISTORY: Past Medical History:  Diagnosis Date   Anemia     Patient Active Problem List    Diagnosis Date Noted   Symptomatic anemia 08/23/2022   Need for immunization against influenza 03/29/2022   Annual physical exam 08/26/2021   Right lumbosacral radiculopathy 08/12/2021   Fibroid, uterine 08/10/2021   Hypertriglyceridemia 02/08/2021   Anxiety and depression 07/13/2020   Gastroesophageal reflux disease without esophagitis 07/13/2020   Fibroid 07/13/2020   Class 3 severe obesity due to excess calories with body mass index (BMI) of 45.0 to 49.9 in adult (HCC) 07/13/2020   Sickle cell trait (HCC) 07/01/2020   Iron deficiency anemia 06/06/2020   PAST SURGICAL HISTORY: Past Surgical History:  Procedure Laterality Date   CHOLECYSTECTOMY  2013    FAMILY HISTORY: Family History  Problem Relation Age of Onset   Diabetes Maternal Grandmother    Lung cancer Maternal Grandfather    Kidney disease Mother    Clotting disorder Father    Sickle cell trait Father     ADVANCED DIRECTIVES (Y/N):  N  HEALTH MAINTENANCE: Social History   Tobacco Use   Smoking status: Never   Smokeless tobacco: Never  Vaping Use   Vaping Use: Never used  Substance Use Topics   Alcohol use: Yes    Alcohol/week: 2.0 standard drinks of alcohol    Types: 1 Glasses of wine, 1 Shots of liquor per week   Drug use: Never    Colonoscopy:  PAP:  Bone density:  Lipid panel:  No Known Allergies  Current Outpatient Medications  Medication Sig Dispense Refill   citalopram (CELEXA) 20 MG tablet TAKE 1 TABLET BY MOUTH EVERY DAY 90 tablet 1   Iron Sucrose (VENOFER IV) Inject 200 mg into the vein every 7 (seven) days.     Iron-Vitamin  C (VITRON-C) 65-125 MG TABS Take 1 tablet by mouth daily. 90 tablet 1   omeprazole (PRILOSEC) 40 MG capsule Take 1 capsule (40 mg total) by mouth daily as needed. 30 capsule 0   No current facility-administered medications for this visit.    OBJECTIVE: Vitals:   08/29/22 1537  BP: (!) 150/86  Pulse: 79  Temp: 98.6 F (37 C)  SpO2: 100%      Body mass index  is 43.77 kg/m.    ECOG FS:1 - Symptomatic but completely ambulatory  General: Well-developed, well-nourished, no acute distress. Lungs: No audible wheezing or coughing Heart: Regular rate and rhythm.  Abdomen: Soft, nontender, nondistended.  Musculoskeletal: No edema, cyanosis, or clubbing. Neuro: Alert, answering all questions appropriately. Cranial nerves grossly intact. Skin: No rashes or petechiae noted. Pale Psych: Normal affect.    LAB RESULTS: Lab Results  Component Value Date   WBC 7.4 08/29/2022   NEUTROABS 4.2 08/29/2022   HGB 8.3 (L) 08/29/2022   HCT 27.7 (L) 08/29/2022   MCV 70.5 (L) 08/29/2022   PLT 284 08/29/2022   Lab Results  Component Value Date   IRON 15 (L) 08/23/2022   TIBC 528 (H) 08/23/2022   IRONPCTSAT 3 (L) 08/23/2022   Lab Results  Component Value Date   FERRITIN 3 (L) 08/23/2022    STUDIES: No results found.  ASSESSMENT: Iron deficiency anemia.  PLAN:    1.  Iron deficiency anemia: etiology secondary to heavy menses. Last IV iron 02/15/22. Previously discussed switching from venofer to monoferric but does not appear it was approved by insurance. She will continue venofer. Hemoglobin worsened in interim to 5.2. She received 2 units pRBCs and venofer 300 mg IV in hospital. Today, hemoglobin improved to 8.3. Plan for venofer 200 mg IV x 5. Continue monthly lab checks to monitor for drops. She'll continue oral iron.   2.  Sickle cell trait: No intervention needed.  She does not have any children at this time.  Likely the etiology of her mild baseline anemia.  3. Heavy menses- hx of fibroids. Previously saw Dr. Bonney Aid and discussed options for management in March 2022. Will re-refer to [employer].   Disposition: Venofer today 1 week- venofer 2 weeks- lab (H&H), venofer 3 weeks- venofer 4 weeks- lab (cbc, ferritin, iron studies), see me, venofer- la  I spent a total of 20 minutes reviewing chart data, face-to-face evaluation with the  patient, counseling and coordination of care as detailed above.  Patient expressed understanding and was in agreement with this plan. She also understands that She can call clinic at any time with any questions, concerns, or complaints.   Alinda Dooms, NP  08/29/2022

## 2022-08-31 ENCOUNTER — Encounter: Payer: Self-pay | Admitting: Obstetrics and Gynecology

## 2022-08-31 ENCOUNTER — Ambulatory Visit: Payer: 59 | Admitting: Obstetrics and Gynecology

## 2022-08-31 VITALS — BP 133/83 | HR 69 | Ht 64.0 in | Wt 256.9 lb

## 2022-08-31 DIAGNOSIS — D259 Leiomyoma of uterus, unspecified: Secondary | ICD-10-CM | POA: Diagnosis not present

## 2022-08-31 DIAGNOSIS — D219 Benign neoplasm of connective and other soft tissue, unspecified: Secondary | ICD-10-CM

## 2022-08-31 NOTE — Progress Notes (Signed)
HPI:      Ms. Lashondra Kren is a 32 y.o. G0P0000 who LMP was Patient's last menstrual period was 08/10/2022.  Subjective:   She presents today stating that her menstrual cycles have returned and are very heavy.  She states that she ended up in the emergency department because of her heavy bleeding with one of her cycles.  She has known uterine fibroids and has been considering methods of cycle control.  We discussed this at length at her last visit. She is not currently sexually active.  She does not believe she wants any children in the future.    Hx: The following portions of the patient's history were reviewed and updated as appropriate:             She  has a past medical history of Anemia. She does not have any pertinent problems on file. She  has a past surgical history that includes Cholecystectomy (2013). Her family history includes Clotting disorder in her father; Diabetes in her maternal grandmother; Kidney disease in her mother; Lung cancer in her maternal grandfather; Sickle cell trait in her father. She  reports that she has never smoked. She has never used smokeless tobacco. She reports current alcohol use of about 2.0 standard drinks of alcohol per week. She reports that she does not use drugs. She has a current medication list which includes the following prescription(s): citalopram, iron sucrose, vitron-c, and omeprazole. She has No Known Allergies.       Review of Systems:  Review of Systems  Constitutional: Denied constitutional symptoms, night sweats, recent illness, fatigue, fever, insomnia and weight loss.  Eyes: Denied eye symptoms, eye pain, photophobia, vision change and visual disturbance.  Ears/Nose/Throat/Neck: Denied ear, nose, throat or neck symptoms, hearing loss, nasal discharge, sinus congestion and sore throat.  Cardiovascular: Denied cardiovascular symptoms, arrhythmia, chest pain/pressure, edema, exercise intolerance, orthopnea and palpitations.   Respiratory: Denied pulmonary symptoms, asthma, pleuritic pain, productive sputum, cough, dyspnea and wheezing.  Gastrointestinal: Denied, gastro-esophageal reflux, melena, nausea and vomiting.  Genitourinary: See HPI for additional information.  Musculoskeletal: Denied musculoskeletal symptoms, stiffness, swelling, muscle weakness and myalgia.  Dermatologic: Denied dermatology symptoms, rash and scar.  Neurologic: Denied neurology symptoms, dizziness, headache, neck pain and syncope.  Psychiatric: Denied psychiatric symptoms, anxiety and depression.  Endocrine: Denied endocrine symptoms including hot flashes and night sweats.   Meds:   Current Outpatient Medications on File Prior to Visit  Medication Sig Dispense Refill   citalopram (CELEXA) 20 MG tablet TAKE 1 TABLET BY MOUTH EVERY DAY 90 tablet 1   Iron Sucrose (VENOFER IV) Inject 200 mg into the vein every 7 (seven) days.     Iron-Vitamin C (VITRON-C) 65-125 MG TABS Take 1 tablet by mouth daily. 90 tablet 1   omeprazole (PRILOSEC) 40 MG capsule Take 1 capsule (40 mg total) by mouth daily as needed. 30 capsule 0   No current facility-administered medications on file prior to visit.      Objective:     Vitals:   08/31/22 1510 08/31/22 1515  BP: (!) 153/91 133/83  Pulse: 69    Filed Weights   08/31/22 1510  Weight: 256 lb 14.4 oz (116.5 kg)                        Assessment:    G0P0000 Patient Active Problem List   Diagnosis Date Noted   Symptomatic anemia 08/23/2022   Need for immunization against influenza 03/29/2022  Annual physical exam 08/26/2021   Right lumbosacral radiculopathy 08/12/2021   Fibroid, uterine 08/10/2021   Hypertriglyceridemia 02/08/2021   Anxiety and depression 07/13/2020   Gastroesophageal reflux disease without esophagitis 07/13/2020   Fibroid 07/13/2020   Class 3 severe obesity due to excess calories with body mass index (BMI) of 45.0 to 49.9 in adult [facility]) 07/13/2020   Sickle cell  trait (HCC) 07/01/2020   Iron deficiency anemia 06/06/2020     1. Fibroids     Uterine fibroids causing anemia and very heavy menstrual bleeding   Plan:            1.  We once again discussed multiple methods of hormonal control UFE endometrial ablation and definitive surgery including hysterectomy.  Patient is most interested in UFE.  Handout for [facility] vascular given.  Patient will schedule with them for UFE. Orders Orders Placed This Encounter  Procedures   Ambulatory referral to Vascular Surgery    No orders of the defined types were placed in this encounter.     F/U  No follow-ups on file. I spent 21 minutes involved in the care of this patient preparing to see the patient by obtaining and reviewing her medical history (including labs, imaging tests and prior procedures), documenting clinical information in the electronic health record (EHR), counseling and coordinating care plans, writing and sending prescriptions, ordering tests or procedures and in direct communicating with the patient and medical staff discussing pertinent items from her history and physical exam.  Elonda Husky, M.D. 08/31/2022 3:33 PM

## 2022-08-31 NOTE — Progress Notes (Signed)
Patient presents today to follow-up on fibroids. She states last months cycle was light to moderate, reports no pain.

## 2022-09-05 ENCOUNTER — Inpatient Hospital Stay: Payer: 59

## 2022-09-05 VITALS — BP 133/83 | HR 76 | Resp 16

## 2022-09-05 DIAGNOSIS — K219 Gastro-esophageal reflux disease without esophagitis: Secondary | ICD-10-CM | POA: Diagnosis not present

## 2022-09-05 DIAGNOSIS — D509 Iron deficiency anemia, unspecified: Secondary | ICD-10-CM | POA: Diagnosis not present

## 2022-09-05 DIAGNOSIS — D573 Sickle-cell trait: Secondary | ICD-10-CM | POA: Diagnosis not present

## 2022-09-05 DIAGNOSIS — Z801 Family history of malignant neoplasm of trachea, bronchus and lung: Secondary | ICD-10-CM | POA: Diagnosis not present

## 2022-09-05 DIAGNOSIS — D508 Other iron deficiency anemias: Secondary | ICD-10-CM

## 2022-09-05 DIAGNOSIS — R5383 Other fatigue: Secondary | ICD-10-CM | POA: Diagnosis not present

## 2022-09-05 DIAGNOSIS — Z6841 Body Mass Index (BMI) 40.0 and over, adult: Secondary | ICD-10-CM | POA: Diagnosis not present

## 2022-09-05 DIAGNOSIS — Z79899 Other long term (current) drug therapy: Secondary | ICD-10-CM | POA: Diagnosis not present

## 2022-09-05 MED ORDER — SODIUM CHLORIDE 0.9 % IV SOLN
Freq: Once | INTRAVENOUS | Status: AC
  Filled 2022-09-05: qty 250

## 2022-09-05 MED ORDER — SODIUM CHLORIDE 0.9 % IV SOLN
200.0000 mg | Freq: Once | INTRAVENOUS | Status: AC
  Administered 2022-09-05: 200 mg via INTRAVENOUS
  Filled 2022-09-05: qty 200

## 2022-09-09 ENCOUNTER — Other Ambulatory Visit: Payer: Self-pay | Admitting: *Deleted

## 2022-09-09 DIAGNOSIS — D508 Other iron deficiency anemias: Secondary | ICD-10-CM

## 2022-09-12 ENCOUNTER — Inpatient Hospital Stay: Payer: 59

## 2022-09-12 VITALS — BP 124/79 | HR 64 | Temp 98.4°F | Resp 18

## 2022-09-12 DIAGNOSIS — Z79899 Other long term (current) drug therapy: Secondary | ICD-10-CM | POA: Diagnosis not present

## 2022-09-12 DIAGNOSIS — D573 Sickle-cell trait: Secondary | ICD-10-CM | POA: Diagnosis not present

## 2022-09-12 DIAGNOSIS — D508 Other iron deficiency anemias: Secondary | ICD-10-CM

## 2022-09-12 DIAGNOSIS — Z801 Family history of malignant neoplasm of trachea, bronchus and lung: Secondary | ICD-10-CM | POA: Diagnosis not present

## 2022-09-12 DIAGNOSIS — K219 Gastro-esophageal reflux disease without esophagitis: Secondary | ICD-10-CM | POA: Diagnosis not present

## 2022-09-12 DIAGNOSIS — R5383 Other fatigue: Secondary | ICD-10-CM | POA: Diagnosis not present

## 2022-09-12 DIAGNOSIS — D509 Iron deficiency anemia, unspecified: Secondary | ICD-10-CM | POA: Diagnosis not present

## 2022-09-12 DIAGNOSIS — Z6841 Body Mass Index (BMI) 40.0 and over, adult: Secondary | ICD-10-CM | POA: Diagnosis not present

## 2022-09-12 DIAGNOSIS — D500 Iron deficiency anemia secondary to blood loss (chronic): Secondary | ICD-10-CM

## 2022-09-12 LAB — SAMPLE TO BLOOD BANK

## 2022-09-12 LAB — HEMOGLOBIN AND HEMATOCRIT (CANCER CENTER ONLY)
HCT: 31.9 % — ABNORMAL LOW (ref 36.0–46.0)
Hemoglobin: 9.6 g/dL — ABNORMAL LOW (ref 12.0–15.0)

## 2022-09-12 MED ORDER — SODIUM CHLORIDE 0.9 % IV SOLN
200.0000 mg | Freq: Once | INTRAVENOUS | Status: AC
  Administered 2022-09-12: 200 mg via INTRAVENOUS
  Filled 2022-09-12: qty 200

## 2022-09-12 MED ORDER — SODIUM CHLORIDE 0.9 % IV SOLN
INTRAVENOUS | Status: DC
  Filled 2022-09-12: qty 250

## 2022-09-12 NOTE — Patient Instructions (Signed)

## 2022-09-20 ENCOUNTER — Inpatient Hospital Stay: Payer: 59

## 2022-09-20 VITALS — BP 132/82 | HR 66 | Temp 98.0°F | Resp 18

## 2022-09-20 DIAGNOSIS — Z6841 Body Mass Index (BMI) 40.0 and over, adult: Secondary | ICD-10-CM | POA: Diagnosis not present

## 2022-09-20 DIAGNOSIS — Z801 Family history of malignant neoplasm of trachea, bronchus and lung: Secondary | ICD-10-CM | POA: Diagnosis not present

## 2022-09-20 DIAGNOSIS — D573 Sickle-cell trait: Secondary | ICD-10-CM | POA: Diagnosis not present

## 2022-09-20 DIAGNOSIS — D508 Other iron deficiency anemias: Secondary | ICD-10-CM

## 2022-09-20 DIAGNOSIS — D509 Iron deficiency anemia, unspecified: Secondary | ICD-10-CM | POA: Diagnosis not present

## 2022-09-20 DIAGNOSIS — R5383 Other fatigue: Secondary | ICD-10-CM | POA: Diagnosis not present

## 2022-09-20 DIAGNOSIS — Z79899 Other long term (current) drug therapy: Secondary | ICD-10-CM | POA: Diagnosis not present

## 2022-09-20 DIAGNOSIS — K219 Gastro-esophageal reflux disease without esophagitis: Secondary | ICD-10-CM | POA: Diagnosis not present

## 2022-09-20 MED ORDER — SODIUM CHLORIDE 0.9 % IV SOLN
INTRAVENOUS | Status: DC | PRN
  Filled 2022-09-20: qty 250

## 2022-09-20 MED ORDER — SODIUM CHLORIDE 0.9 % IV SOLN
200.0000 mg | Freq: Once | INTRAVENOUS | Status: AC
  Administered 2022-09-20: 200 mg via INTRAVENOUS
  Filled 2022-09-20: qty 200

## 2022-09-23 ENCOUNTER — Other Ambulatory Visit: Payer: Self-pay

## 2022-09-23 DIAGNOSIS — D508 Other iron deficiency anemias: Secondary | ICD-10-CM

## 2022-09-26 ENCOUNTER — Inpatient Hospital Stay: Payer: 59 | Attending: Oncology

## 2022-09-26 ENCOUNTER — Inpatient Hospital Stay: Payer: 59

## 2022-09-26 ENCOUNTER — Encounter: Payer: Self-pay | Admitting: Nurse Practitioner

## 2022-09-26 ENCOUNTER — Inpatient Hospital Stay: Payer: 59 | Admitting: Nurse Practitioner

## 2022-09-26 VITALS — BP 140/74 | HR 62 | Temp 98.5°F | Wt 251.0 lb

## 2022-09-26 VITALS — BP 133/86 | HR 58 | Resp 18

## 2022-09-26 DIAGNOSIS — E781 Pure hyperglyceridemia: Secondary | ICD-10-CM | POA: Diagnosis not present

## 2022-09-26 DIAGNOSIS — D500 Iron deficiency anemia secondary to blood loss (chronic): Secondary | ICD-10-CM

## 2022-09-26 DIAGNOSIS — D509 Iron deficiency anemia, unspecified: Secondary | ICD-10-CM | POA: Insufficient documentation

## 2022-09-26 DIAGNOSIS — D508 Other iron deficiency anemias: Secondary | ICD-10-CM

## 2022-09-26 DIAGNOSIS — D573 Sickle-cell trait: Secondary | ICD-10-CM | POA: Diagnosis not present

## 2022-09-26 DIAGNOSIS — K219 Gastro-esophageal reflux disease without esophagitis: Secondary | ICD-10-CM | POA: Insufficient documentation

## 2022-09-26 DIAGNOSIS — Z6841 Body Mass Index (BMI) 40.0 and over, adult: Secondary | ICD-10-CM | POA: Diagnosis not present

## 2022-09-26 DIAGNOSIS — Z79899 Other long term (current) drug therapy: Secondary | ICD-10-CM | POA: Diagnosis not present

## 2022-09-26 DIAGNOSIS — Z801 Family history of malignant neoplasm of trachea, bronchus and lung: Secondary | ICD-10-CM | POA: Insufficient documentation

## 2022-09-26 DIAGNOSIS — N920 Excessive and frequent menstruation with regular cycle: Secondary | ICD-10-CM | POA: Diagnosis not present

## 2022-09-26 LAB — CBC WITH DIFFERENTIAL (CANCER CENTER ONLY)
Abs Immature Granulocytes: 0.01 10*3/uL (ref 0.00–0.07)
Basophils Absolute: 0 10*3/uL (ref 0.0–0.1)
Basophils Relative: 1 %
Eosinophils Absolute: 0.1 10*3/uL (ref 0.0–0.5)
Eosinophils Relative: 1 %
HCT: 35.5 % — ABNORMAL LOW (ref 36.0–46.0)
Hemoglobin: 11 g/dL — ABNORMAL LOW (ref 12.0–15.0)
Immature Granulocytes: 0 %
Lymphocytes Relative: 44 %
Lymphs Abs: 2.4 10*3/uL (ref 0.7–4.0)
MCH: 23.8 pg — ABNORMAL LOW (ref 26.0–34.0)
MCHC: 31 g/dL (ref 30.0–36.0)
MCV: 76.7 fL — ABNORMAL LOW (ref 80.0–100.0)
Monocytes Absolute: 0.4 10*3/uL (ref 0.1–1.0)
Monocytes Relative: 8 %
Neutro Abs: 2.5 10*3/uL (ref 1.7–7.7)
Neutrophils Relative %: 46 %
Platelet Count: 300 10*3/uL (ref 150–400)
RBC: 4.63 MIL/uL (ref 3.87–5.11)
RDW: 27.9 % — ABNORMAL HIGH (ref 11.5–15.5)
WBC Count: 5.5 10*3/uL (ref 4.0–10.5)
nRBC: 0 % (ref 0.0–0.2)

## 2022-09-26 LAB — IRON AND TIBC
Iron: 108 ug/dL (ref 28–170)
Saturation Ratios: 29 % (ref 10.4–31.8)
TIBC: 378 ug/dL (ref 250–450)
UIBC: 270 ug/dL

## 2022-09-26 LAB — FERRITIN: Ferritin: 110 ng/mL (ref 11–307)

## 2022-09-26 MED ORDER — SODIUM CHLORIDE 0.9 % IV SOLN
200.0000 mg | Freq: Once | INTRAVENOUS | Status: AC
  Administered 2022-09-26: 200 mg via INTRAVENOUS
  Filled 2022-09-26: qty 200

## 2022-09-26 MED ORDER — SODIUM CHLORIDE 0.9 % IV SOLN
INTRAVENOUS | Status: DC
  Filled 2022-09-26: qty 250

## 2022-09-26 NOTE — Patient Instructions (Signed)

## 2022-09-26 NOTE — Progress Notes (Signed)
[facility]  [phone] [phone]  ID: [name] OB: January 14, 1991  MR#: [id number]  AOZ#:[id number]  Patient Care Team: Jerrol Banana, MD as PCP - General (Family Medicine)  CHIEF COMPLAINT: Iron deficiency anemia  INTERVAL HISTORY: Patient is a 32 year old female with history of iron deficiency anemia secondary to heavy menses who returns to clinic for follow-up. She has seen OB and is waiting to hear from vascular fro possible procedure. She feels well and denies complaints. Has lost some weight intentionally.    REVIEW OF SYSTEMS:   Review of Systems  Constitutional:  Positive for malaise/fatigue. Negative for chills, fever and weight loss.  Respiratory:  Negative for cough and shortness of breath.   Cardiovascular:  Negative for chest pain, palpitations, orthopnea and claudication.  Gastrointestinal:  Negative for abdominal pain, blood in stool, constipation, diarrhea, melena, nausea and vomiting.  Genitourinary:  Negative for frequency and hematuria.       Fibroids  Musculoskeletal:  Negative for back pain, joint pain, myalgias and neck pain.  Skin: Negative.   Neurological:  Negative for dizziness, tingling, weakness and headaches.  Endo/Heme/Allergies:  Does not bruise/bleed easily.  Psychiatric/Behavioral:  Negative for depression. The patient is not nervous/anxious.   As per HPI. Otherwise, a complete review of systems is negative.  PAST MEDICAL HISTORY: Past Medical History:  Diagnosis Date   Anemia     Patient Active Problem List   Diagnosis Date Noted   Symptomatic anemia 08/23/2022   Need for immunization against influenza 03/29/2022   Annual physical exam 08/26/2021   Right lumbosacral radiculopathy 08/12/2021   Fibroid, uterine 08/10/2021   Hypertriglyceridemia 02/08/2021   Anxiety and depression 07/13/2020   Gastroesophageal reflux disease without esophagitis 07/13/2020   Fibroid 07/13/2020   Class 3 severe  obesity due to excess calories with body mass index (BMI) of 45.0 to 49.9 in adult (HCC) 07/13/2020   Sickle cell trait (HCC) 07/01/2020   Iron deficiency anemia 06/06/2020   PAST SURGICAL HISTORY: Past Surgical History:  Procedure Laterality Date   CHOLECYSTECTOMY  2013    FAMILY HISTORY: Family History  Problem Relation Age of Onset   Diabetes Maternal Grandmother    Lung cancer Maternal Grandfather    Kidney disease Mother    Clotting disorder Father    Sickle cell trait Father     ADVANCED DIRECTIVES (Y/N):  N  HEALTH MAINTENANCE: Social History   Tobacco Use   Smoking status: Never   Smokeless tobacco: Never  Vaping Use   Vaping Use: Never used  Substance Use Topics   Alcohol use: Yes    Alcohol/week: 2.0 standard drinks of alcohol    Types: 1 Glasses of wine, 1 Shots of liquor per week   Drug use: Never    Colonoscopy:  PAP:  Bone density:  Lipid panel:  No Known Allergies  Current Outpatient Medications  Medication Sig Dispense Refill   citalopram (CELEXA) 20 MG tablet TAKE 1 TABLET BY MOUTH EVERY DAY 90 tablet 1   Iron Sucrose (VENOFER IV) Inject 200 mg into the vein every 7 (seven) days.     Iron-Vitamin C (VITRON-C) 65-125 MG TABS Take 1 tablet by mouth daily. 90 tablet 1   omeprazole (PRILOSEC) 40 MG capsule Take 1 capsule (40 mg total) by mouth daily as needed. 30 capsule 0   No current facility-administered medications for this visit.    OBJECTIVE: Vitals:   09/26/22 1526  BP: (!) 140/74  Pulse: 62  Temp: 98.5  F (36.9 C)      Body mass index is 43.08 kg/m.    ECOG FS:1 - Symptomatic but completely ambulatory  General: Well-developed, well-nourished, no acute distress. Lungs: No audible wheezing or coughing Heart: Regular rate and rhythm.  Abdomen: Soft, nontender, nondistended.  Musculoskeletal: No edema, cyanosis, or clubbing. Neuro: Alert, answering all questions appropriately. Cranial nerves grossly intact. Skin: No rashes or  petechiae noted. Pale Psych: Normal affect.    LAB RESULTS: Lab Results  Component Value Date   WBC 5.5 09/26/2022   NEUTROABS 2.5 09/26/2022   HGB 11.0 (L) 09/26/2022   HCT 35.5 (L) 09/26/2022   MCV 76.7 (L) 09/26/2022   PLT 300 09/26/2022   Lab Results  Component Value Date   IRON 29 08/29/2022   TIBC 490 (H) 08/29/2022   IRONPCTSAT 6 (L) 08/29/2022   Lab Results  Component Value Date   FERRITIN 55 08/29/2022    STUDIES: No results found.  ASSESSMENT: Iron deficiency anemia.  PLAN:    1.  Iron deficiency anemia: etiology secondary to heavy menses. Last IV iron 02/15/22. Previously discussed switching from venofer to monoferric but does not appear it was approved by insurance. She will continue venofer. Hemoglobin dropped to 5.2 in April 2024- hospitalization, 2 units pRBCs and 300 mg IV venofer in hospital. She has now received 4 of 5 planned venofer doses outpatient. Hemoglobin has improved to 11. Iron studies pending at time of visit. Venofer today then recheck H&H in 1 month. Repeat iron studies in 2 months. Continue oral iron. Iron studies have resulted and are replenished. Iron sat 29%, ferritin 155.   2.  Sickle cell trait: No intervention needed.  She does not have any children at this time.  Likely the etiology of her mild baseline anemia.  3. Heavy menses- hx of fibroids. Previously saw Dr. Bonney Aid and discussed options for management in March 2022. S/p evaluation with Dr. Logan Bores at [facility]. Awaiting appointment with vascular for embolization at [facility]. Recommended she call them to follow up.   Disposition: Venofer today 1 mo- lab (H&H) 2 mo- lab (cbc, ferritin, iron studies) Day to week later see me, +/- venofer- la  I spent a total of 20 minutes reviewing chart data, face-to-face evaluation with the patient, counseling and coordination of care as detailed above.  Patient expressed understanding and was in agreement with this plan. She  also understands that She can call clinic at any time with any questions, concerns, or complaints.   Thank you for allowing me to participate in the care of this very pleasant patient.   Alinda Dooms, NP  09/26/2022

## 2022-10-11 ENCOUNTER — Encounter: Payer: Self-pay | Admitting: Family Medicine

## 2022-10-11 ENCOUNTER — Ambulatory Visit (INDEPENDENT_AMBULATORY_CARE_PROVIDER_SITE_OTHER): Payer: 59 | Admitting: Family Medicine

## 2022-10-11 VITALS — BP 120/80 | HR 80 | Ht 64.0 in | Wt 246.0 lb

## 2022-10-11 DIAGNOSIS — M5417 Radiculopathy, lumbosacral region: Secondary | ICD-10-CM | POA: Diagnosis not present

## 2022-10-11 DIAGNOSIS — K219 Gastro-esophageal reflux disease without esophagitis: Secondary | ICD-10-CM

## 2022-10-11 DIAGNOSIS — R7301 Impaired fasting glucose: Secondary | ICD-10-CM | POA: Insufficient documentation

## 2022-10-11 DIAGNOSIS — D508 Other iron deficiency anemias: Secondary | ICD-10-CM

## 2022-10-11 DIAGNOSIS — E785 Hyperlipidemia, unspecified: Secondary | ICD-10-CM

## 2022-10-11 DIAGNOSIS — F32A Depression, unspecified: Secondary | ICD-10-CM

## 2022-10-11 DIAGNOSIS — Z0000 Encounter for general adult medical examination without abnormal findings: Secondary | ICD-10-CM

## 2022-10-11 DIAGNOSIS — E559 Vitamin D deficiency, unspecified: Secondary | ICD-10-CM

## 2022-10-11 DIAGNOSIS — F419 Anxiety disorder, unspecified: Secondary | ICD-10-CM | POA: Diagnosis not present

## 2022-10-11 MED ORDER — OMEPRAZOLE 40 MG PO CPDR: 40.0000 mg | DELAYED_RELEASE_CAPSULE | Freq: Every day | ORAL | 0 refills | Status: DC | PRN

## 2022-10-11 NOTE — Assessment & Plan Note (Addendum)
Intermittent symptomatology, mostly controlled through dietary/lifestyle changes.  -Continue as needed omeprazole 40 mg for breakthrough symptoms

## 2022-10-11 NOTE — Patient Instructions (Signed)
-   Obtain fasting labs with orders provided (can have water or black coffee but otherwise no food or drink x 8 hours before labs) - Review information provided - Attend eye doctor annually, dentist every 6 months, work towards or maintain 30 minutes of moderate intensity physical activity at least 5 days per week, and consume a balanced diet - Return in 1 year for physical - Contact us for any questions between now and then  Additionally: - Continue as needed omeprazole 40 mg for breakthrough symptoms - Continue home exercises/stretches for low back/legs

## 2022-10-11 NOTE — Progress Notes (Signed)
Annual Physical Exam Visit  Patient Information:  Patient ID: Eileen Vargas, female DOB: 08-18-1990 Age: 32 y.o. MRN: [id number]   Subjective:   CC: Annual Physical Exam  HPI:  Eileen Vargas is here for their annual physical.  I reviewed the past medical history, family history, social history, surgical history, and allergies today and changes were made as necessary.  Please see the problem list section below for additional details.  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   Past Surgical History: Past Surgical History:  Procedure Laterality Date   CHOLECYSTECTOMY  2013   Family History: Family History  Problem Relation Age of Onset   Diabetes Maternal Grandmother    Lung cancer Maternal Grandfather    Kidney disease Mother    Clotting disorder Father    Sickle cell trait Father    Early death Father    Allergies: No Known Allergies Health Maintenance: Health Maintenance  Topic Date Due   COVID-19 Vaccine (4 - 2023-24 season) 04/15/2023 (Originally 12/24/2021)   INFLUENZA VACCINE  11/24/2022   PAP SMEAR-Modifier  07/24/2023   DTaP/Tdap/Td (2 - Td or Tdap) 08/27/2031   Hepatitis C Screening  Completed   HIV Screening  Completed   HPV VACCINES  Aged Out    HM Colonoscopy     This patient has no relevant Health Maintenance data.      Medications: Current Outpatient Medications on File Prior to Visit  Medication Sig Dispense Refill   citalopram (CELEXA) 20 MG tablet TAKE 1 TABLET BY MOUTH EVERY DAY 90 tablet 1   Iron Sucrose (VENOFER IV) Inject 200 mg into the vein every 7 (seven) days.     Iron-Vitamin C (VITRON-C) 65-125 MG TABS Take 1 tablet by mouth daily. 90 tablet 1   No current facility-administered medications on file prior to visit.    Review of Systems: No headache, visual changes, nausea,  vomiting, diarrhea, constipation, dizziness, abdominal pain, skin rash, fevers, chills, night sweats, swollen lymph nodes, weight loss, chest pain, body aches, joint swelling, muscle aches, shortness of breath, mood changes, visual or auditory hallucinations reported.  Objective:   Vitals:   10/11/22 1006  BP: 120/80  Pulse: 80  SpO2: 99%   Vitals:   10/11/22 1006  Weight: 246 lb (111.6 kg)  Height: 5\' 4"  (1.626 m)   Body mass index is 42.23 kg/m.  General: Well Developed, well nourished, and in no acute distress.  Neuro: Alert and oriented x3, extra-ocular muscles intact, sensation grossly intact. Cranial nerves II through XII are grossly intact, motor, sensory, and coordinative functions are intact. HEENT: Normocephalic, atraumatic, pupils equal round reactive to light, neck supple, no masses, no lymphadenopathy, thyroid nonpalpable. Oropharynx, nasopharynx, external ear canals are unremarkable. Skin: Warm and dry, no rashes noted.  Cardiac: Regular rate and rhythm, no murmurs rubs or gallops. No peripheral edema. Pulses symmetric. Respiratory: Clear to auscultation bilaterally. Not using accessory muscles, speaking in full sentences.  Abdominal: Soft, nontender, nondistended, positive bowel sounds, no masses, no organomegaly. Musculoskeletal: Shoulder, elbow, wrist, hip, knee, ankle stable, and with full range of motion.  Female chaperone initials: KG present throughout the physical examination.  Impression and Recommendations:   The patient was counselled, risk factors were discussed, and anticipatory guidance given.  Problem List Items Addressed This Visit  Digestive   Gastroesophageal reflux disease without esophagitis    Intermittent symptomatology, mostly controlled through dietary/lifestyle changes.  -Continue as needed omeprazole 40 mg for breakthrough symptoms      Relevant Medications   omeprazole (PRILOSEC) 40 MG capsule     Nervous and Auditory    Right lumbosacral radiculopathy    Chronic, currently well-controlled, performing home stretches and not requiring medications on a regular basis.        Other   Iron deficiency anemia    Being managed by Hematology.      Hyperlipidemia   Relevant Orders   Lipid panel   Comprehensive metabolic panel   Elevated fasting glucose   Relevant Orders   Hemoglobin A1c   Anxiety and depression    Well controlled on current regimen.      Annual physical exam    Annual examination completed, risk stratification labs ordered, anticipatory guidance provided.  We will follow labs once resulted.      Relevant Orders   Lipid panel   TSH   VITAMIN D 25 Hydroxy (Vit-D Deficiency, Fractures)   Comprehensive metabolic panel   Hemoglobin A1c   Other Visit Diagnoses     Healthcare maintenance    -  Primary   Relevant Orders   Lipid panel   TSH   VITAMIN D 25 Hydroxy (Vit-D Deficiency, Fractures)   Comprehensive metabolic panel   Hemoglobin A1c   Vitamin D deficiency       Relevant Orders   VITAMIN D 25 Hydroxy (Vit-D Deficiency, Fractures)        Orders & Medications Medications:  Meds ordered this encounter  Medications   omeprazole (PRILOSEC) 40 MG capsule    Sig: Take 1 capsule (40 mg total) by mouth daily as needed.    Dispense:  90 capsule    Refill:  0   Orders Placed This Encounter  Procedures   Lipid panel   TSH   VITAMIN D 25 Hydroxy (Vit-D Deficiency, Fractures)   Comprehensive metabolic panel   Hemoglobin A1c     No follow-ups on file.    Jerrol Banana, MD, [facility]   Primary Care Sports Medicine Primary Care and Sports Medicine at [facility]

## 2022-10-11 NOTE — Assessment & Plan Note (Addendum)
Annual examination completed, risk stratification labs ordered, anticipatory guidance provided.  We will follow labs once resulted. 

## 2022-10-11 NOTE — Assessment & Plan Note (Signed)
Being managed by Hematology.

## 2022-10-11 NOTE — Assessment & Plan Note (Signed)
Well-controlled on current regimen. ?

## 2022-10-11 NOTE — Assessment & Plan Note (Signed)
Chronic, currently well-controlled, performing home stretches and not requiring medications on a regular basis.

## 2022-10-12 ENCOUNTER — Other Ambulatory Visit: Payer: Self-pay | Admitting: Family Medicine

## 2022-10-12 DIAGNOSIS — E559 Vitamin D deficiency, unspecified: Secondary | ICD-10-CM

## 2022-10-12 LAB — COMPREHENSIVE METABOLIC PANEL
ALT: 18 IU/L (ref 0–32)
AST: 16 IU/L (ref 0–40)
Albumin: 4.4 g/dL (ref 3.9–4.9)
Alkaline Phosphatase: 87 IU/L (ref 44–121)
BUN/Creatinine Ratio: 11 (ref 9–23)
BUN: 10 mg/dL (ref 6–20)
Bilirubin Total: 0.3 mg/dL (ref 0.0–1.2)
CO2: 21 mmol/L (ref 20–29)
Calcium: 9.4 mg/dL (ref 8.7–10.2)
Chloride: 103 mmol/L (ref 96–106)
Creatinine, Ser: 0.89 mg/dL (ref 0.57–1.00)
Globulin, Total: 2.9 g/dL (ref 1.5–4.5)
Glucose: 88 mg/dL (ref 70–99)
Potassium: 4.5 mmol/L (ref 3.5–5.2)
Sodium: 139 mmol/L (ref 134–144)
Total Protein: 7.3 g/dL (ref 6.0–8.5)
eGFR: 88 mL/min/{1.73_m2} (ref >59)

## 2022-10-12 LAB — LIPID PANEL
Chol/HDL Ratio: 3.6 ratio (ref 0.0–4.4)
Cholesterol, Total: 151 mg/dL (ref 100–199)
HDL: 42 mg/dL (ref >39)
LDL Chol Calc (NIH): 92 mg/dL (ref 0–99)
Triglycerides: 89 mg/dL (ref 0–149)
VLDL Cholesterol Cal: 17 mg/dL (ref 5–40)

## 2022-10-12 LAB — HEMOGLOBIN A1C
Est. average glucose Bld gHb Est-mCnc: 100 mg/dL
Hgb A1c MFr Bld: 5.1 % (ref 4.8–5.6)

## 2022-10-12 LAB — TSH: TSH: 1.07 u[IU]/mL (ref 0.450–4.500)

## 2022-10-12 LAB — VITAMIN D 25 HYDROXY (VIT D DEFICIENCY, FRACTURES): Vit D, 25-Hydroxy: 10.3 ng/mL — ABNORMAL LOW (ref 30.0–100.0)

## 2022-10-12 MED ORDER — VITAMIN D (ERGOCALCIFEROL) 1.25 MG (50000 UNIT) PO CAPS
50000.0000 [IU] | ORAL_CAPSULE | ORAL | 0 refills | Status: DC
Start: 2022-10-12 — End: 2024-03-14

## 2022-10-28 ENCOUNTER — Inpatient Hospital Stay: Payer: 59 | Attending: Oncology

## 2022-10-28 DIAGNOSIS — D509 Iron deficiency anemia, unspecified: Secondary | ICD-10-CM | POA: Insufficient documentation

## 2022-10-28 DIAGNOSIS — D500 Iron deficiency anemia secondary to blood loss (chronic): Secondary | ICD-10-CM

## 2022-10-28 LAB — HEMOGLOBIN AND HEMATOCRIT (CANCER CENTER ONLY)
HCT: 35.3 % — ABNORMAL LOW (ref 36.0–46.0)
Hemoglobin: 11.4 g/dL — ABNORMAL LOW (ref 12.0–15.0)

## 2022-11-02 ENCOUNTER — Other Ambulatory Visit: Payer: Self-pay | Admitting: Family Medicine

## 2022-11-02 DIAGNOSIS — E559 Vitamin D deficiency, unspecified: Secondary | ICD-10-CM

## 2022-11-02 NOTE — Telephone Encounter (Signed)
Requested medication (s) are due for refill today: routing for review  Requested medication (s) are on the active medication list: yes  Last refill:  10/12/22  Future visit scheduled: yes  Notes to clinic:  Manual Review: Route requests for 50,000 IU strength to the provider.     Requested Prescriptions  Pending Prescriptions Disp Refills   Vitamin D, Ergocalciferol, (DRISDOL) 1.25 MG (50000 UNIT) CAPS capsule [Pharmacy Med Name: VITAMIN D2 1.25MG (50,000 UNIT)] 12 capsule 1    Sig: TAKE 1 CAPSULE (50,000UNITS) BY MOUTH EVERY 7 DAYS FOR 8 WEEKS     Endocrinology:  Vitamins - Vitamin D Supplementation 2 Failed - 11/02/2022  9:31 AM      Failed - Manual Review: Route requests for 50,000 IU strength to the provider      Failed - Vitamin D in normal range and within 360 days    Vit D, 25-Hydroxy  Date Value Ref Range Status  10/11/2022 10.3 (L) 30.0 - 100.0 ng/mL Final    Comment:    Vitamin D deficiency has been defined by the Institute of Medicine and an Endocrine Society practice guideline as a level of serum 25-OH vitamin D less than 20 ng/mL (1,2). The Endocrine Society went on to further define vitamin D insufficiency as a level between 21 and 29 ng/mL (2). 1. IOM (Institute of Medicine). 2010. Dietary reference    intakes for calcium and D. [place]: The    [employer]. 2. Holick MF, Binkley [place], Bischoff-Ferrari HA, et al.    Evaluation, treatment, and prevention of vitamin D    deficiency: an Endocrine Society clinical practice    guideline. JCEM. 2011 Jul; 96(7):1911-30.          Passed - Ca in normal range and within 360 days    Calcium  Date Value Ref Range Status  10/11/2022 9.4 8.7 - 10.2 mg/dL Final         Passed - Valid encounter within last 12 months    Recent Outpatient Visits           3 weeks ago Healthcare maintenance   [facility] Primary Care [facility] Medicine at [facility], Ocie Bob, MD   3 months ago Right  lumbosacral radiculopathy   [place] [facility] Care & [facility] Medicine at [facility], Ocie Bob, MD   5 months ago Right lumbosacral radiculopathy   [place] Primary Care & Sports Medicine at [facility], Ocie Bob, MD   7 months ago Right lumbosacral radiculopathy   [place] Primary Care & Sports Medicine at [facility], Ocie Bob, MD   1 year ago Annual physical exam   [facility] Primary Care & Sports Medicine at [facility], Ocie Bob, MD       Future Appointments             In 11 months Ashley Royalty, Ocie Bob, MD [facility] Primary Care & Sports Medicine at [facility]

## 2022-11-14 ENCOUNTER — Other Ambulatory Visit: Payer: Self-pay

## 2022-11-14 ENCOUNTER — Encounter: Payer: Self-pay | Admitting: Emergency Medicine

## 2022-11-14 ENCOUNTER — Emergency Department
Admission: EM | Admit: 2022-11-14 | Discharge: 2022-11-14 | Disposition: A | Discharge status: Home / Self Care | Payer: 59 | Attending: Emergency Medicine | Admitting: Emergency Medicine

## 2022-11-14 ENCOUNTER — Emergency Department: Payer: 59

## 2022-11-14 DIAGNOSIS — N200 Calculus of kidney: Secondary | ICD-10-CM | POA: Diagnosis not present

## 2022-11-14 DIAGNOSIS — N132 Hydronephrosis with renal and ureteral calculous obstruction: Secondary | ICD-10-CM | POA: Insufficient documentation

## 2022-11-14 DIAGNOSIS — I10 Essential (primary) hypertension: Secondary | ICD-10-CM | POA: Diagnosis not present

## 2022-11-14 DIAGNOSIS — R109 Unspecified abdominal pain: Secondary | ICD-10-CM | POA: Diagnosis not present

## 2022-11-14 DIAGNOSIS — K760 Fatty (change of) liver, not elsewhere classified: Secondary | ICD-10-CM | POA: Diagnosis not present

## 2022-11-14 LAB — CBC
HCT: 37.3 % (ref 36.0–46.0)
Hemoglobin: 12.3 g/dL (ref 12.0–15.0)
MCH: 26.7 pg (ref 26.0–34.0)
MCHC: 33 g/dL (ref 30.0–36.0)
MCV: 81.1 fL (ref 80.0–100.0)
Platelets: 277 10*3/uL (ref 150–400)
RBC: 4.6 MIL/uL (ref 3.87–5.11)
RDW: 16.6 % — ABNORMAL HIGH (ref 11.5–15.5)
WBC: 8.4 10*3/uL (ref 4.0–10.5)
nRBC: 0 % (ref 0.0–0.2)

## 2022-11-14 LAB — COMPREHENSIVE METABOLIC PANEL
ALT: 15 U/L (ref 0–44)
AST: 15 U/L (ref 15–41)
Albumin: 4.2 g/dL (ref 3.5–5.0)
Alkaline Phosphatase: 58 U/L (ref 38–126)
Anion gap: 7 (ref 5–15)
BUN: 9 mg/dL (ref 6–20)
CO2: 22 mmol/L (ref 22–32)
Calcium: 8.6 mg/dL — ABNORMAL LOW (ref 8.9–10.3)
Chloride: 106 mmol/L (ref 98–111)
Creatinine, Ser: 0.89 mg/dL (ref 0.44–1.00)
GFR, Estimated: >60 mL/min (ref >60)
Glucose, Bld: 137 mg/dL — ABNORMAL HIGH (ref 70–99)
Potassium: 3.5 mmol/L (ref 3.5–5.1)
Sodium: 135 mmol/L (ref 135–145)
Total Bilirubin: 0.4 mg/dL (ref 0.3–1.2)
Total Protein: 8 g/dL (ref 6.5–8.1)

## 2022-11-14 LAB — URINALYSIS, ROUTINE W REFLEX MICROSCOPIC
Bacteria, UA: NONE SEEN
Bilirubin Urine: NEGATIVE
Glucose, UA: NEGATIVE mg/dL
Ketones, ur: NEGATIVE mg/dL
Leukocytes,Ua: NEGATIVE
Nitrite: NEGATIVE
Protein, ur: NEGATIVE mg/dL
RBC / HPF: >50 RBC/hpf (ref 0–5)
Specific Gravity, Urine: 1.009 (ref 1.005–1.030)
pH: 8 (ref 5.0–8.0)

## 2022-11-14 LAB — PREGNANCY, URINE: Preg Test, Ur: NEGATIVE

## 2022-11-14 LAB — LIPASE, BLOOD: Lipase: 26 U/L (ref 11–51)

## 2022-11-14 MED ORDER — LACTATED RINGERS IV BOLUS
1000.0000 mL | Freq: Once | INTRAVENOUS | Status: AC
  Administered 2022-11-14: 1000 mL via INTRAVENOUS

## 2022-11-14 MED ORDER — ONDANSETRON 4 MG PO TBDP: 4.0000 mg | ORAL_TABLET | Freq: Three times a day (TID) | ORAL | 0 refills | Status: DC | PRN

## 2022-11-14 MED ORDER — ONDANSETRON HCL 4 MG/2ML IJ SOLN
4.0000 mg | Freq: Once | INTRAMUSCULAR | Status: AC
  Administered 2022-11-14: 4 mg via INTRAVENOUS
  Filled 2022-11-14: qty 2

## 2022-11-14 MED ORDER — OXYCODONE-ACETAMINOPHEN 5-325 MG PO TABS: 1.0000 | ORAL_TABLET | ORAL | 0 refills | Status: DC | PRN

## 2022-11-14 MED ORDER — MORPHINE SULFATE (PF) 4 MG/ML IV SOLN
4.0000 mg | Freq: Once | INTRAVENOUS | Status: AC
  Administered 2022-11-14: 4 mg via INTRAVENOUS
  Filled 2022-11-14: qty 1

## 2022-11-14 NOTE — ED Triage Notes (Signed)
Pt presents ambulatory to triage via POV with complaints of bilateral lower abdominal pain and nausea x 1 day. Endorses pain 9/10 with R side worse than L. Of note, the patient started her period yesterday and has hx of fibroids and heavier periods. No meds taken PTA. A&Ox4 at this time. Denies CP or SOB.

## 2022-11-14 NOTE — ED Provider Notes (Signed)
[facility] Provider Note    Event Date/Time   First MD Initiated Contact with Patient 11/14/22 [phone]     (approximate)   History   Chief Complaint Abdominal Pain   HPI  Eileen Vargas is a 32 y.o. female with past medical history of hyperlipidemia, anemia, fibroids, and sickle cell trait who presents to the ED complaining of abdominal pain.  Patient reports that since last night she has been dealing with severe pain in her left flank radiating around to the left side of her abdomen.  She initially attributed it to her menstrual period, however pain seem to worsen throughout the day.  She denies any associated nausea, vomiting, or changes in bowel movements.  She has not had any dysuria, hematuria, vaginal bleeding, or discharge.  She denies any history of similar symptoms, has never had surgery on her abdomen.     Physical Exam   Triage Vital Signs: ED Triage Vitals  Encounter Vitals Group     BP 11/14/22 0252 (!) 172/103     Systolic BP Percentile --      Diastolic BP Percentile --      Pulse Rate 11/14/22 0252 68     Resp 11/14/22 0252 16     Temp 11/14/22 0252 98.3 F (36.8 C)     Temp Source 11/14/22 0252 Oral     SpO2 11/14/22 0252 96 %     Weight 11/14/22 0253 240 lb (108.9 kg)     Height 11/14/22 0253 5\' 4"  (1.626 m)     Head Circumference --      Peak Flow --      Pain Score 11/14/22 0252 9     Pain Loc --      Pain Education --      Exclude from Growth Chart --     Most recent vital signs: Vitals:   11/14/22 0252  BP: (!) 172/103  Pulse: 68  Resp: 16  Temp: 98.3 F (36.8 C)  SpO2: 96%    Constitutional: Alert and oriented. Eyes: Conjunctivae are normal. Head: Atraumatic. Nose: No congestion/rhinnorhea. Mouth/Throat: Mucous membranes are moist.  Cardiovascular: Normal rate, regular rhythm. Grossly normal heart sounds.  2+ radial pulses bilaterally. Respiratory: Normal respiratory effort.  No retractions. Lungs  CTAB. Gastrointestinal: Soft and tender to palpation in the left upper and lower quadrants with no rebound or guarding.  No CVA tenderness bilaterally. No distention. Musculoskeletal: No lower extremity tenderness nor edema.  Neurologic:  Normal speech and language. No gross focal neurologic deficits are appreciated.    ED Results / Procedures / Treatments   Labs (all labs ordered are listed, but only abnormal results are displayed) Labs Reviewed  COMPREHENSIVE METABOLIC PANEL - Abnormal; Notable for the following components:      Result Value   Glucose, Bld 137 (*)    Calcium 8.6 (*)    All other components within normal limits  CBC - Abnormal; Notable for the following components:   RDW 16.6 (*)    All other components within normal limits  URINALYSIS, ROUTINE W REFLEX MICROSCOPIC - Abnormal; Notable for the following components:   Color, Urine STRAW (*)    APPearance HAZY (*)    Hgb urine dipstick LARGE (*)    All other components within normal limits  LIPASE, BLOOD  PREGNANCY, URINE   RADIOLOGY CT renal protocol reviewed and interpreted by me with proximal left ureteral stone and associated hydronephrosis.  PROCEDURES:  Critical Care performed: No  Procedures   MEDICATIONS ORDERED IN ED: Medications  morphine (PF) 4 MG/ML injection 4 mg (4 mg Intravenous Given 11/14/22 0517)  ondansetron (ZOFRAN) injection 4 mg (4 mg Intravenous Given 11/14/22 0517)  lactated ringers bolus 1,000 mL (1,000 mLs Intravenous New Bag/Given 11/14/22 0518)     IMPRESSION / MDM / ASSESSMENT AND PLAN / ED COURSE  I reviewed the triage vital signs and the nursing notes.                              32 y.o. female with past medical history of hyperlipidemia, anemia, fibroids, and sickle cell trait who presents to the ED with increasing left flank pain radiating to the left side of her abdomen since last night.  Patient's presentation is most consistent with acute presentation with potential  threat to life or bodily function.  Differential diagnosis includes, but is not limited to, kidney stone, pyelonephritis, colitis, diverticulitis, gastritis, ectopic pregnancy, UTI.  Patient nontoxic-appearing and in no acute distress, vital signs remarkable for hypertension but otherwise reassuring.  Her abdomen is soft with tenderness over the left side, no CVA tenderness noted.  Labs are reassuring with no significant anemia, leukocytosis, tract abnormality, or AKI.  LFTs and lipase are unremarkable.  Pregnancy testing is negative, urinalysis shows no signs of infection but does show RBCs, raising suspicion for kidney stone.  We will further assess with CT renal protocol, treat symptomatically with IV morphine and Zofran, hydrate with IV fluids.  CT renal protocol shows 7 mm stone at the left proximal ureter with associated hydronephrosis, no other acute findings noted.  On reassessment, patient reports feeling better with minimal pain and she has been able to tolerate oral intake without difficulty.  She is appropriate for outpatient management, will provide referral to urology given moderately sized proximal stone.  Patient prescribed course of pain and nausea medication, was counseled to return to the ED for new or worsening symptoms.  Patient agrees with plan.      FINAL CLINICAL IMPRESSION(S) / ED DIAGNOSES   Final diagnoses:  Kidney stone on left side     Rx / DC Orders   ED Discharge Orders          Ordered    oxyCODONE-acetaminophen (PERCOCET) 5-325 MG tablet  Every 4 hours PRN        11/14/22 0637    ondansetron (ZOFRAN-ODT) 4 MG disintegrating tablet  Every 8 hours PRN        11/14/22 1610             Note:  This document was prepared using Dragon voice recognition software and may include unintentional dictation errors.   Chesley Noon, MD 11/14/22 [phone]

## 2022-11-15 ENCOUNTER — Encounter: Payer: Self-pay | Admitting: Family Medicine

## 2022-11-15 NOTE — Telephone Encounter (Signed)
Please advise, ER visit yesterday

## 2022-11-21 ENCOUNTER — Telehealth: Payer: Self-pay | Admitting: *Deleted

## 2022-11-21 ENCOUNTER — Telehealth: Payer: 59 | Admitting: Family Medicine

## 2022-11-21 NOTE — Telephone Encounter (Signed)
Left message on voice mail to call the office and schedule appt.

## 2022-11-21 NOTE — Telephone Encounter (Signed)
-----   Message from Verna Czech [facility] sent at 11/20/2022  1:42 PM EDT ----- Regarding: ED follow-up visit Please schedule new patient visit with KUB prior

## 2022-11-22 ENCOUNTER — Telehealth: Payer: Self-pay | Admitting: *Deleted

## 2022-11-22 NOTE — Transitions of Care (Post Inpatient/ED Visit) (Signed)
   11/22/2022  Name: Eileen Vargas MRN: [id number] DOB: 03-24-1991  Today's TOC FU Call Status: Today's TOC FU Call Status:: Unsuccessul Call (1st Attempt) Unsuccessful Call (1st Attempt) Date: 11/22/22  Attempted to reach the patient regarding the most recent Inpatient/ED visit.  Follow Up Plan: Additional outreach attempts will be made to reach the patient to complete the Transitions of Care (Post Inpatient/ED visit) call.   Gean Maidens BSN RN [facility] [phone]

## 2022-11-23 ENCOUNTER — Telehealth: Payer: Self-pay | Admitting: *Deleted

## 2022-11-23 NOTE — Transitions of Care (Post Inpatient/ED Visit) (Signed)
11/23/2022  Name: Eileen Vargas MRN: [id number] DOB: 1990/05/21  Today's TOC FU Call Status: Today's TOC FU Call Status:: Successful TOC FU Call Completed TOC FU Call Complete Date: 11/23/22  Transition Care Management Follow-up Telephone Call Date of Discharge: 11/14/22 Discharge Facility: [facility]) Reason for ED Visit: Other: (kidneystone) How have you been since you were released from the hospital?: Better Any questions or concerns?: No  Items Reviewed: Did you receive and understand the discharge instructions provided?: Yes Medications obtained,verified, and reconciled?: Yes (Medications Reviewed) Any new allergies since your discharge?: No Dietary orders reviewed?: No Do you have support at home?: Yes People in Home: alone Name of Support/Comfort Primary Source: Marlene Bast  Medications Reviewed Today: Medications Reviewed Today     Reviewed by Luella Cook, RN (Case Manager) on 11/23/22 at 1540  Med List Status: <None>   Medication Order Taking? Sig Documenting Provider Last Dose Status Informant  citalopram (CELEXA) 20 MG tablet [id number] Yes TAKE 1 TABLET BY MOUTH EVERY DAY Jerrol Banana, MD Taking Active Self  Iron Sucrose (VENOFER IV) [id number] Yes Inject 200 mg into the vein every 7 (seven) days. [provider] Taking Active Self  Iron-Vitamin C (VITRON-C) 65-125 MG TABS [id number] Yes Take 1 tablet by mouth daily. Alinda Dooms, NP Taking Active Self  omeprazole (PRILOSEC) 40 MG capsule [id number] Yes Take 1 capsule (40 mg total) by mouth daily as needed. Jerrol Banana, MD Taking Active   ondansetron (ZOFRAN-ODT) 4 MG disintegrating tablet [id number] Yes Take 1 tablet (4 mg total) by mouth every 8 (eight) hours as needed for nausea or vomiting. Chesley Noon, MD Taking Active   oxyCODONE-acetaminophen (PERCOCET) 5-325 MG tablet [id number] Yes Take 1 tablet by mouth every 4 (four) hours as needed for severe pain. Chesley Noon, MD Taking Active   Vitamin D, Ergocalciferol, (DRISDOL) 1.25 MG (50000 UNIT) CAPS capsule [id number] Yes Take 1 capsule (50,000 Units total) by mouth every 7 (seven) days. Take for 8 total doses(weeks) Jerrol Banana, MD Taking Active             Home Care and Equipment/Supplies: Were Home Health Services Ordered?: NA Any new equipment or medical supplies ordered?: NA  Functional Questionnaire: Do you need assistance with bathing/showering or dressing?: No Do you need assistance with meal preparation?: No Do you need assistance with eating?: No Do you need assistance with getting out of bed/getting out of a chair/moving?: No Do you have difficulty managing or taking your medications?: No  Follow up appointments reviewed: PCP Follow-up appointment confirmed?: NA Specialist Hospital Follow-up appointment confirmed?: Yes Date of Specialist follow-up appointment?: 11/30/22 Follow-Up Specialty Provider:: Legrand Rams Do you need transportation to your follow-up appointment?: No Do you understand care options if your condition(s) worsen?: Yes-patient verbalized understanding  SDOH Interventions Today    Flowsheet Row Most Recent Value  SDOH Interventions   Food Insecurity Interventions Intervention Not Indicated  Housing Interventions Intervention Not Indicated  Transportation Interventions Intervention Not Indicated      Interventions Today    Flowsheet Row Most Recent Value  General Interventions   General Interventions Discussed/Reviewed Doctor Visits, General Interventions Discussed, General Interventions Reviewed  Doctor Visits Discussed/Reviewed Doctor Visits Discussed, Doctor Visits Reviewed  Pharmacy Interventions   Pharmacy Dicussed/Reviewed Pharmacy Topics Discussed      TOC Interventions Today    Flowsheet Row Most Recent Value  TOC Interventions   TOC Interventions Discussed/Reviewed TOC Interventions Discussed, TOC Interventions Reviewed  Gean Maidens BSN RN [facility] [phone]

## 2022-11-24 ENCOUNTER — Inpatient Hospital Stay: Payer: 59 | Attending: Oncology

## 2022-11-24 DIAGNOSIS — R634 Abnormal weight loss: Secondary | ICD-10-CM | POA: Insufficient documentation

## 2022-11-24 DIAGNOSIS — D573 Sickle-cell trait: Secondary | ICD-10-CM | POA: Insufficient documentation

## 2022-11-24 DIAGNOSIS — D509 Iron deficiency anemia, unspecified: Secondary | ICD-10-CM | POA: Insufficient documentation

## 2022-11-24 DIAGNOSIS — Z79899 Other long term (current) drug therapy: Secondary | ICD-10-CM | POA: Insufficient documentation

## 2022-11-24 DIAGNOSIS — Z801 Family history of malignant neoplasm of trachea, bronchus and lung: Secondary | ICD-10-CM | POA: Insufficient documentation

## 2022-11-24 DIAGNOSIS — K760 Fatty (change of) liver, not elsewhere classified: Secondary | ICD-10-CM | POA: Insufficient documentation

## 2022-11-24 DIAGNOSIS — D259 Leiomyoma of uterus, unspecified: Secondary | ICD-10-CM | POA: Insufficient documentation

## 2022-11-28 ENCOUNTER — Encounter: Payer: Self-pay | Admitting: Nurse Practitioner

## 2022-11-28 ENCOUNTER — Inpatient Hospital Stay: Payer: 59

## 2022-11-28 ENCOUNTER — Encounter: Payer: Self-pay | Admitting: Family Medicine

## 2022-11-28 ENCOUNTER — Inpatient Hospital Stay: Payer: 59 | Admitting: Nurse Practitioner

## 2022-11-28 ENCOUNTER — Other Ambulatory Visit: Payer: 59

## 2022-11-28 VITALS — BP 156/91 | HR 67 | Temp 97.3°F | Wt 239.0 lb

## 2022-11-28 VITALS — BP 142/87 | HR 62 | Temp 97.8°F | Resp 17

## 2022-11-28 DIAGNOSIS — K760 Fatty (change of) liver, not elsewhere classified: Secondary | ICD-10-CM | POA: Diagnosis not present

## 2022-11-28 DIAGNOSIS — D509 Iron deficiency anemia, unspecified: Secondary | ICD-10-CM | POA: Diagnosis not present

## 2022-11-28 DIAGNOSIS — D500 Iron deficiency anemia secondary to blood loss (chronic): Secondary | ICD-10-CM

## 2022-11-28 DIAGNOSIS — Z79899 Other long term (current) drug therapy: Secondary | ICD-10-CM | POA: Diagnosis not present

## 2022-11-28 DIAGNOSIS — D508 Other iron deficiency anemias: Secondary | ICD-10-CM

## 2022-11-28 DIAGNOSIS — D573 Sickle-cell trait: Secondary | ICD-10-CM | POA: Diagnosis not present

## 2022-11-28 DIAGNOSIS — Z801 Family history of malignant neoplasm of trachea, bronchus and lung: Secondary | ICD-10-CM | POA: Diagnosis not present

## 2022-11-28 DIAGNOSIS — R634 Abnormal weight loss: Secondary | ICD-10-CM | POA: Diagnosis not present

## 2022-11-28 DIAGNOSIS — D259 Leiomyoma of uterus, unspecified: Secondary | ICD-10-CM | POA: Diagnosis not present

## 2022-11-28 LAB — CBC WITH DIFFERENTIAL/PLATELET
Abs Immature Granulocytes: 0.01 10*3/uL (ref 0.00–0.07)
Basophils Absolute: 0 10*3/uL (ref 0.0–0.1)
Basophils Relative: 0 %
Eosinophils Absolute: 0.1 10*3/uL (ref 0.0–0.5)
Eosinophils Relative: 1 %
HCT: 33.5 % — ABNORMAL LOW (ref 36.0–46.0)
Hemoglobin: 10.9 g/dL — ABNORMAL LOW (ref 12.0–15.0)
Immature Granulocytes: 0 %
Lymphocytes Relative: 36 %
Lymphs Abs: 2.5 10*3/uL (ref 0.7–4.0)
MCH: 27 pg (ref 26.0–34.0)
MCHC: 32.5 g/dL (ref 30.0–36.0)
MCV: 82.9 fL (ref 80.0–100.0)
Monocytes Absolute: 0.4 10*3/uL (ref 0.1–1.0)
Monocytes Relative: 5 %
Neutro Abs: 4 10*3/uL (ref 1.7–7.7)
Neutrophils Relative %: 58 %
Platelets: 282 10*3/uL (ref 150–400)
RBC: 4.04 MIL/uL (ref 3.87–5.11)
RDW: 14.7 % (ref 11.5–15.5)
WBC: 7 10*3/uL (ref 4.0–10.5)
nRBC: 0 % (ref 0.0–0.2)

## 2022-11-28 LAB — IRON AND TIBC
Iron: 91 ug/dL (ref 28–170)
Saturation Ratios: 26 % (ref 10.4–31.8)
TIBC: 357 ug/dL (ref 250–450)
UIBC: 266 ug/dL

## 2022-11-28 LAB — FERRITIN: Ferritin: 37 ng/mL (ref 11–307)

## 2022-11-28 MED ORDER — SODIUM CHLORIDE 0.9 % IV SOLN
Freq: Once | INTRAVENOUS | Status: AC
  Filled 2022-11-28: qty 250

## 2022-11-28 MED ORDER — SODIUM CHLORIDE 0.9 % IV SOLN
200.0000 mg | Freq: Once | INTRAVENOUS | Status: AC
  Administered 2022-11-28: 200 mg via INTRAVENOUS
  Filled 2022-11-28: qty 200

## 2022-11-28 NOTE — Progress Notes (Signed)
[facility]  Telephone:(336) [phone] Fax:(336) [phone]  ID: Eileen Vargas OB: [birth year]  MR#: [id number]  AOZ#:[id number]  Patient Care Team: Jerrol Banana, MD as PCP - General (Family Medicine)  CHIEF COMPLAINT: Iron deficiency anemia  INTERVAL HISTORY: Patient is a 32 year old female with history of iron deficiency anemia secondary to heavy menses who returns to clinic for follow-up. She has seen OB and is considering embolization with vascular. Currently has kidney stone and unsure if she has passed it. Otherwise, she feels well and denies complaints. Has lost some weight intentionally.    REVIEW OF SYSTEMS:   Review of Systems  Constitutional:  Positive for malaise/fatigue. Negative for chills, fever and weight loss.  Respiratory:  Negative for cough and shortness of breath.   Cardiovascular:  Negative for chest pain, palpitations, orthopnea and claudication.  Gastrointestinal:  Negative for abdominal pain, blood in stool, constipation, diarrhea, melena, nausea and vomiting.  Genitourinary:  Negative for frequency and hematuria.       Fibroids  Musculoskeletal:  Negative for back pain, joint pain, myalgias and neck pain.  Skin: Negative.   Neurological:  Negative for dizziness, tingling, weakness and headaches.  Endo/Heme/Allergies:  Does not bruise/bleed easily.  Psychiatric/Behavioral:  Negative for depression. The patient is not nervous/anxious.   As per HPI. Otherwise, a complete review of systems is negative.  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    Patient Active Problem List   Diagnosis Date Noted   Elevated fasting glucose 10/11/2022   Symptomatic anemia 08/23/2022   Need for immunization against influenza 03/29/2022   Annual physical exam 08/26/2021   Right lumbosacral radiculopathy  08/12/2021   Fibroid, uterine 08/10/2021   Hyperlipidemia 02/08/2021   Anxiety and depression 07/13/2020   Gastroesophageal reflux disease without esophagitis 07/13/2020   Fibroid 07/13/2020   Class 3 severe obesity due to excess calories with body mass index (BMI) of 45.0 to 49.9 in adult (HCC) 07/13/2020   Sickle cell trait (HCC) 07/01/2020   Iron deficiency anemia 06/06/2020   PAST SURGICAL HISTORY: Past Surgical History:  Procedure Laterality Date   CHOLECYSTECTOMY  2013    FAMILY HISTORY: Family History  Problem Relation Age of Onset   Diabetes Maternal Grandmother    Lung cancer Maternal Grandfather    Kidney disease Mother    Clotting disorder Father    Sickle cell trait Father    Early death Father     ADVANCED DIRECTIVES (Y/N):  N  HEALTH MAINTENANCE: Social History   Tobacco Use   Smoking status: Never   Smokeless tobacco: Never  Vaping Use   Vaping status: Never Used  Substance Use Topics   Alcohol use: Not Currently    Alcohol/week: 1.0 standard drink of alcohol    Types: 1 Standard drinks or equivalent per week    Comment: Max numbers only   Drug use: Never    Colonoscopy:  PAP:  Bone density:  Lipid panel:  No Known Allergies  Current Outpatient Medications  Medication Sig Dispense Refill   citalopram (CELEXA) 20 MG tablet TAKE 1 TABLET BY MOUTH EVERY DAY 90 tablet 1   Iron Sucrose (VENOFER IV) Inject 200 mg into the vein every 7 (seven) days.     Iron-Vitamin C (VITRON-C) 65-125 MG TABS Take 1 tablet by mouth daily. 90 tablet 1  omeprazole (PRILOSEC) 40 MG capsule Take 1 capsule (40 mg total) by mouth daily as needed. 90 capsule 0   ondansetron (ZOFRAN-ODT) 4 MG disintegrating tablet Take 1 tablet (4 mg total) by mouth every 8 (eight) hours as needed for nausea or vomiting. 12 tablet 0   oxyCODONE-acetaminophen (PERCOCET) 5-325 MG tablet Take 1 tablet by mouth every 4 (four) hours as needed for severe pain. 12 tablet 0   Vitamin D,  Ergocalciferol, (DRISDOL) 1.25 MG (50000 UNIT) CAPS capsule Take 1 capsule (50,000 Units total) by mouth every 7 (seven) days. Take for 8 total doses(weeks) 8 capsule 0   No current facility-administered medications for this visit.    OBJECTIVE: Vitals:   11/28/22 1315  BP: (!) 156/91  Pulse: 67  Temp: (!) 97.3 F (36.3 C)  SpO2: 100%      Body mass index is 41.02 kg/m.    ECOG FS:1 - Symptomatic but completely ambulatory  General: Well-developed, well-nourished, no acute distress. Lungs: No audible wheezing or coughing Heart: Regular rate and rhythm.  Abdomen: Soft, nontender, nondistended.  Musculoskeletal: No edema, cyanosis, or clubbing. Neuro: Alert, answering all questions appropriately. Cranial nerves grossly intact. Skin: No rashes or petechiae noted.  Psych: Normal affect.   LAB RESULTS: Lab Results  Component Value Date   WBC 7.0 11/28/2022   NEUTROABS 4.0 11/28/2022   HGB 10.9 (L) 11/28/2022   HCT 33.5 (L) 11/28/2022   MCV 82.9 11/28/2022   PLT 282 11/28/2022   Lab Results  Component Value Date   IRON 108 09/26/2022   TIBC 378 09/26/2022   IRONPCTSAT 29 09/26/2022   Lab Results  Component Value Date   FERRITIN 110 09/26/2022    STUDIES: CT Renal Stone Study  Result Date: 11/14/2022 CLINICAL DATA:  Abdominal/flank pain with stone suspected EXAM: CT ABDOMEN AND PELVIS WITHOUT CONTRAST TECHNIQUE: Multidetector CT imaging of the abdomen and pelvis was performed following the standard protocol without IV contrast. RADIATION DOSE REDUCTION: This exam was performed according to the departmental dose-optimization program which includes automated exposure control, adjustment of the mA and/or kV according to patient size and/or use of iterative reconstruction technique. COMPARISON:  05/29/2020 FINDINGS: Lower chest:  No contributory findings. Hepatobiliary: Prominent hepatic steatosis.Cholecystectomy. No biliary dilatation Pancreas: Unremarkable. Spleen:  Unremarkable. Adrenals/Urinary Tract: Negative adrenals. Left hydronephrosis and low-density renal expansion due to a proximal ureteric stone measuring 7 mm in length. Three small lower pole calculi at the left kidney. Unremarkable bladder. Stomach/Bowel:  No obstruction. No appendicitis. Vascular/Lymphatic: No acute vascular abnormality. No mass or adenopathy. Reproductive:Enlarged and lobulated uterus, usually fibroids. Other: No ascites or pneumoperitoneum. Musculoskeletal: No acute abnormalities. IMPRESSION: 1. Obstructing 7 mm stone in the upper left ureter. 2. Left nephrolithiasis. 3. Hepatic steatosis. Electronically Signed   By: Tiburcio Pea M.D.   On: 11/14/2022 06:09    ASSESSMENT: Iron deficiency anemia.  PLAN:    1.  Iron deficiency anemia: etiology secondary to heavy menses. Last IV iron 09/26/22. Previously discussed switching from venofer to monoferric but does not appear it was approved by insurance. She will continue venofer. Hemoglobin dropped to 5.2 in April 2024- hospitalization, 2 units pRBCs and 300 mg IV venofer in hospital. Hemoglobin improved to 11. Last received venofer 09/26/22. Tolerating well. Hemoglobin has dropped to 10.9 from 12.3. Plan for venofer today and later this week. Iron studies and ferritin pending. She would like to continue monthly hemoglobin checks given previous history.   2.  Sickle cell trait: No intervention needed.  She  does not have any children at this time.  Likely the etiology of her mild baseline anemia.  3. Heavy menses- hx of fibroids. Previously saw Dr. Bonney Aid and discussed options for management in March 2022. S/p evaluation with Dr. Logan Bores at [facility]. Awaiting appointment with vascular for embolization at [facility]. Recommended she call them to follow up.   Disposition: Venofer today Venofer later this week 1 mo- lab (H&H) 2 mo- lab (cbc, ferritin, iron studies) Day to week later- see me, +/- venofer- la  I spent a total  of 20 minutes reviewing chart data, face-to-face evaluation with the patient, counseling and coordination of care as detailed above.  Patient expressed understanding and was in agreement with this plan. She also understands that She can call clinic at any time with any questions, concerns, or complaints.   Thank you for allowing me to participate in the care of this very pleasant patient.   Alinda Dooms, NP  11/28/2022

## 2022-11-30 ENCOUNTER — Ambulatory Visit
Admission: RE | Admit: 2022-11-30 | Discharge: 2022-11-30 | Disposition: A | Discharge status: Home / Self Care | Payer: 59 | Attending: Urology | Admitting: Urology

## 2022-11-30 ENCOUNTER — Ambulatory Visit: Payer: 59 | Admitting: Urology

## 2022-11-30 ENCOUNTER — Other Ambulatory Visit: Payer: Self-pay

## 2022-11-30 ENCOUNTER — Ambulatory Visit
Admission: RE | Admit: 2022-11-30 | Discharge: 2022-11-30 | Disposition: A | Discharge status: Home / Self Care | Payer: 59 | Source: Ambulatory Visit | Attending: Urology | Admitting: Urology

## 2022-11-30 ENCOUNTER — Other Ambulatory Visit: Payer: Self-pay | Admitting: *Deleted

## 2022-11-30 ENCOUNTER — Encounter: Payer: Self-pay | Admitting: Urology

## 2022-11-30 ENCOUNTER — Other Ambulatory Visit
Admission: RE | Admit: 2022-11-30 | Discharge: 2022-11-30 | Disposition: A | Discharge status: Home / Self Care | Payer: 59 | Source: Home / Self Care | Attending: Urology | Admitting: Urology

## 2022-11-30 VITALS — BP 143/85 | HR 68 | Ht 64.0 in | Wt 240.0 lb

## 2022-11-30 DIAGNOSIS — N200 Calculus of kidney: Secondary | ICD-10-CM

## 2022-11-30 DIAGNOSIS — K5900 Constipation, unspecified: Secondary | ICD-10-CM | POA: Diagnosis not present

## 2022-11-30 DIAGNOSIS — N202 Calculus of kidney with calculus of ureter: Secondary | ICD-10-CM | POA: Diagnosis not present

## 2022-11-30 DIAGNOSIS — N201 Calculus of ureter: Secondary | ICD-10-CM

## 2022-11-30 DIAGNOSIS — R109 Unspecified abdominal pain: Secondary | ICD-10-CM | POA: Diagnosis not present

## 2022-11-30 LAB — URINALYSIS, COMPLETE (UACMP) WITH MICROSCOPIC
Bilirubin Urine: NEGATIVE
Glucose, UA: NEGATIVE mg/dL
Ketones, ur: NEGATIVE mg/dL
Nitrite: NEGATIVE
Protein, ur: NEGATIVE mg/dL
Specific Gravity, Urine: 1.015 (ref 1.005–1.030)
pH: 5.5 (ref 5.0–8.0)

## 2022-11-30 NOTE — Progress Notes (Signed)
[facility] ORDER FORM  Expected date of procedure: 12/01/2022  Surgeon: [name], MD  Post op standing: 2-4wk follow up w/KUB prior  Anticoagulation/Aspirin/NSAID standing order: Hold all 72 hours prior  Anesthesia standing order: MAC  VTE standing: SCD's  Dx: Left Ureteral Stone  Procedure: left Extracorporeal shock wave lithotripsy  CPT : 50590  Standing Order Set:   *NPO after mn, KUB  *NS 136ml/hr, Keflex 500mg  PO, Benadryl 25mg  PO, Valium 10mg  PO, Zofran 4mg  IV    Medications if other than standing orders:   NONE

## 2022-11-30 NOTE — H&P (View-Only) (Signed)
11/30/22 3:28 PM   Eileen Vargas Sep 18, 1990 [id number]  CC: Left ureteral stone  HPI: 32 year old female who presented to the ER on 11/14/2018 for severe left-sided flank pain.  CT showed a 7 mm left proximal ureteral stone with hydronephrosis, as well as a smaller left renal nonobstructing stone.  She denies any prior history of kidney stones.  Urinalysis was benign and she was discharged with pain control and urology follow-up.  She has overall felt well over the last week or so.  She has not taken any NSAIDs, using Tylenol for pain.  She denies any dysuria or UTI symptoms.   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 She has never been exposed to tobacco smoke. She has never used smokeless tobacco. She reports that she does not currently use alcohol after a past usage of about 1.0 standard drink of alcohol per week. She reports that she does not use drugs.  Physical Exam: LMP 11/12/2022 (Exact Date)    Constitutional:  Alert and oriented, No acute distress. Cardiovascular: No clubbing, cyanosis, or edema. Respiratory: Normal respiratory effort, no increased work of breathing. GI: Abdomen is soft, nontender, nondistended, no abdominal masses   Laboratory Data: Reviewed, see epic  Pertinent Imaging: I have personally viewed and interpreted the CT scan showing a 7 mm left proximal ureteral stone, 1200HU, 14cm SSD.  Can be seen on KUB #1 today  between L4/L5 transverse process, better seen on inverted films.  Assessment & Plan:   32 year old female with 7 mm left mid/proximal ureteral stone, appears to still be present on KUB today though symptoms have improved.  She has an upcoming vacation and will be out of town for 1 and half weeks and would like to have this treated prior to leaving town.  We discussed various treatment options for urolithiasis including observation with or without medical expulsive therapy, shockwave lithotripsy (SWL), ureteroscopy and laser lithotripsy with stent placement, and percutaneous nephrolithotomy.We discussed that management is based on stone size, location, density, patient co-morbidities, and patient preference. Stones <47mm in size have a >80% spontaneous passage rate. Data surrounding the use of tamsulosin for medical expulsive therapy is controversial, but meta analyses suggests it is most efficacious for distal stones between 5-71mm in size. Possible side effects include dizziness/lightheadedness, and retrograde ejaculation.SWL has a lower stone free rate in a single procedure, but also a lower complication rate compared to ureteroscopy and avoids a stent and associated stent related symptoms. Possible complications include renal hematoma, steinstrasse, and need for additional treatment.Ureteroscopy with laser lithotripsy and stent placement has a higher stone free rate than SWL in a single procedure, however increased complication rate including possible infection, ureteral injury, bleeding, and stent related morbidity. Common stent related symptoms include dysuria, urgency/frequency, and flank pain.  After an extensive discussion of the risks and benefits of the above treatment options, the  patient would like to proceed with left shockwave lithotripsy tomorrow.   Legrand Rams, MD 11/30/2022  [facility] [address] [place], [place] [address] [phone]

## 2022-11-30 NOTE — Progress Notes (Signed)
11/30/22 3:28 PM   Eileen Vargas Sep 18, 1990 [id number]  CC: Left ureteral stone  HPI: 32 year old female who presented to the ER on 11/14/2018 for severe left-sided flank pain.  CT showed a 7 mm left proximal ureteral stone with hydronephrosis, as well as a smaller left renal nonobstructing stone.  She denies any prior history of kidney stones.  Urinalysis was benign and she was discharged with pain control and urology follow-up.  She has overall felt well over the last week or so.  She has not taken any NSAIDs, using Tylenol for pain.  She denies any dysuria or UTI symptoms.   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 She has never been exposed to tobacco smoke. She has never used smokeless tobacco. She reports that she does not currently use alcohol after a past usage of about 1.0 standard drink of alcohol per week. She reports that she does not use drugs.  Physical Exam: LMP 11/12/2022 (Exact Date)    Constitutional:  Alert and oriented, No acute distress. Cardiovascular: No clubbing, cyanosis, or edema. Respiratory: Normal respiratory effort, no increased work of breathing. GI: Abdomen is soft, nontender, nondistended, no abdominal masses   Laboratory Data: Reviewed, see epic  Pertinent Imaging: I have personally viewed and interpreted the CT scan showing a 7 mm left proximal ureteral stone, 1200HU, 14cm SSD.  Can be seen on KUB #1 today  between L4/L5 transverse process, better seen on inverted films.  Assessment & Plan:   32 year old female with 7 mm left mid/proximal ureteral stone, appears to still be present on KUB today though symptoms have improved.  She has an upcoming vacation and will be out of town for 1 and half weeks and would like to have this treated prior to leaving town.  We discussed various treatment options for urolithiasis including observation with or without medical expulsive therapy, shockwave lithotripsy (SWL), ureteroscopy and laser lithotripsy with stent placement, and percutaneous nephrolithotomy.We discussed that management is based on stone size, location, density, patient co-morbidities, and patient preference. Stones <47mm in size have a >80% spontaneous passage rate. Data surrounding the use of tamsulosin for medical expulsive therapy is controversial, but meta analyses suggests it is most efficacious for distal stones between 5-71mm in size. Possible side effects include dizziness/lightheadedness, and retrograde ejaculation.SWL has a lower stone free rate in a single procedure, but also a lower complication rate compared to ureteroscopy and avoids a stent and associated stent related symptoms. Possible complications include renal hematoma, steinstrasse, and need for additional treatment.Ureteroscopy with laser lithotripsy and stent placement has a higher stone free rate than SWL in a single procedure, however increased complication rate including possible infection, ureteral injury, bleeding, and stent related morbidity. Common stent related symptoms include dysuria, urgency/frequency, and flank pain.  After an extensive discussion of the risks and benefits of the above treatment options, the  patient would like to proceed with left shockwave lithotripsy tomorrow.   Legrand Rams, MD 11/30/2022  [facility] [address] [place], [place] [address] [phone]

## 2022-11-30 NOTE — Patient Instructions (Signed)
ESWL for Kidney Stones  Extracorporeal shock wave lithotripsy (ESWL) is a treatment that can help break up kidney stones that are too large to pass on their own.  This is a nonsurgical procedure that breaks up a kidney stone with shock waves. These shock waves pass through your body and focus on the kidney stone. They cause the kidney stone to break into smaller pieces (fragments) while it is still in the urinary tract. The fragments of stone can pass more easily out of your body in the urine. Tell a health care provider about: Any allergies you have. All medicines you are taking, including vitamins, herbs, eye drops, creams, and over-the-counter medicines. Any problems you or family members have had with anesthetic medicines. Any bleeding problems you have. Any surgeries you have had. Any medical conditions you have. Whether you are pregnant or may be pregnant. What are the risks? Your health care provider will talk with you about risks. These may include: Infection. Bleeding from the kidney. Bruising of the kidney or skin. Scarring of the kidney. This can lead to: Increased blood pressure. Poor kidney function. Return (recurrence) of kidney stones. Damage to other structures or organs. This may include the liver, colon, spleen, or pancreas. Blockage (obstruction) of the tube that carries urine from the kidney to the bladder (ureter). Failure of the kidney stone to break into fragments. What happens before the procedure? When to stop eating and drinking Follow instructions from your health care provider about what you may eat and drink. These may include: 8 hours before your procedure Stop eating most foods. Do not eat meat, fried foods, or fatty foods. Eat only light foods, such as toast or crackers. All liquids are okay except energy drinks and alcohol. 6 hours before your procedure Stop eating. Drink only clear liquids, such as water, clear fruit juice, black coffee, plain tea,  and sports drinks. Do not drink energy drinks or alcohol. 2 hours before your procedure Stop drinking all liquids. You may be allowed to take medicines with small sips of water. If you do not follow your health care provider's instructions, your procedure may be delayed or canceled. Medicines Ask your health care provider about: Changing or stopping your regular medicines. These include any diabetes medicines or blood thinners you take. Taking medicines such as aspirin and ibuprofen. These medicines can thin your blood. Do not take them unless your health care provider tells you to. Taking over-the-counter medicines, vitamins, herbs, and supplements. Tests You may have tests, such as: Blood tests. Urine tests. Imaging tests. This may include a CT scan. Surgery safety Ask your health care provider: How your surgery site will be marked. What steps will be taken to help prevent infection. These steps may include: Washing skin with a soap that kills germs. Receiving antibiotics. General instructions If you will be going home right after the procedure, plan to have a responsible adult: Take you home from the hospital or clinic. You will not be allowed to drive. Care for you for the time you are told. What happens during the procedure?  An IV will be inserted into one of your veins. You may be given: A sedative. This helps you relax. Anesthesia. This will: Numb certain areas of your body. Make you fall asleep for surgery. A water-filled cushion may be placed behind your kidney or on your abdomen. In some cases, you may be placed in a tub of lukewarm water. Your body will be positioned in a way that makes it  easier to target the kidney stone. An X-ray or ultrasound exam will be done to locate your stone. Shock waves will be aimed at the stone. If you are awake, you may feel a tapping sensation as the shock waves pass through your body. A small mesh tube (stent) may be placed in your  ureter. This will help keep urine flowing from the kidney if the fragments of the stone have been blocking the ureter. The stent will be removed at a later time by your health care provider. The procedure may vary among health care providers and hospitals. What happens after the procedure? Your blood pressure, heart rate, breathing rate, and blood oxygen level will be monitored until you leave the hospital or clinic. You may have an X-ray after the procedure to see how many of the kidney stones were broken up. This will also show how much of the stone has passed. If there are still large fragments after treatment, you may need to have a second procedure at a later time. This information is not intended to replace advice given to you by your health care provider. Make sure you discuss any questions you have with your health care provider. Document Revised: 07/08/2022 Document Reviewed: 08/12/2021 Elsevier Patient Education  2024 [employer]  ESWL for Kidney Stones, Care After The following information offers guidance on how to care for yourself after your procedure. Your health care provider may also give you more specific instructions. If you have problems or questions, contact your health care provider. What can I expect after the procedure? After the procedure, it is common to have: Some blood in your urine. This should only last for a few days. Soreness in your back, sides, or upper abdomen for a few days. Blotches or bruises on the area where the shock wave entered the skin. Pain, discomfort, or nausea when pieces (fragments) of the kidney stone move through the tube that carries urine from the kidney to the bladder (ureter). Fragments may pass soon after the procedure. They may also take up to 4-8 weeks to pass. If you have severe pain or nausea, contact your health care provider. This may be caused by a large stone that was not broken up enough. This may mean that you need more  treatment. Some pain or discomfort during urination. Some pain or discomfort in the lower abdomen or at the base of the penis. Follow these instructions at home: Medicines  Take over-the-counter and prescription medicines only as told by your health care provider. If you were prescribed antibiotics, take them as told by your health care provider. Do not stop using the antibiotic even if you start to feel better. Ask your health care provider if the medicine prescribed to you: Requires you to avoid driving or using machinery. Can cause constipation. You may need to take these actions to prevent or treat constipation: Take over-the-counter or prescription medicines. Eat foods that are high in fiber, such as beans, whole grains, and fresh fruits and vegetables. Limit foods that are high in fat and processed sugars, such as fried or sweet foods. Eating and drinking  Follow instructions from your health care provider about what you may eat and drink. You may be told to: Reduce how much salt (sodium) you eat or drink. Check ingredients and nutrition facts on packaged foods and drinks to see how much sodium they contain. Reduce how much meat you eat. Drink enough fluid to keep your urine pale yellow. This can help you pass  any pieces of the stone that are left. It can also prevent new stones from forming. Eat plenty of fresh fruits and vegetables. Eat the recommended amount of calcium for your age and gender. Ask your health care provider how much calcium you should have. Activity Get plenty of rest as told by your health care provider. Avoid sitting for a long time without moving. Get up to take short walks every 1-2 hours. This is important to improve blood flow and breathing. Ask for help if you feel weak or unsteady. Your health care provider may tell you to lie in a certain position (postural drainage) and tap firmly (percuss) over your kidney area to help stone fragments pass. Follow  instructions as told by your health care provider. Return to your normal activities as told by your health care provider. Ask your health care provider what activities are safe for you. Most people can resume normal activities 1-2 days after the procedure. General instructions If told, strain all urine through the strainer that was provided by your health care provider. Keep all fragments for your health care provider to see. Any stones that are found may be sent to a medical lab for examination. The stone may be as small as a grain of salt. Keep all follow-up visits. This is important if you had a stent placed because it may need to stay in place for a few weeks. Ask your health care provider when the stent will be removed. Contact a health care provider if: You have a fever or chills. You have severe nausea that leads to persistent vomiting. You have any of these urinary symptoms: Increased blood or blood clots in the urine. Urine that smells bad or unusual. A strong urge to urinate after emptying your bladder. Pain or burning with urination that does not go away. A continued need to urinate more often than usual. You have a stent, and it comes out. Get help right away if: You have severe pain in your back, sides, or upper abdomen. You faint. You have any of these urinary symptoms: Severe pain while urinating. More blood in your urine, or blood in your urine when you did not have any before. Blood clots in your urine larger than 1 inch (2.5 cm) in size. You pass only a small amount of urine when you urinate or are unable to pass any urine. This information is not intended to replace advice given to you by your health care provider. Make sure you discuss any questions you have with your health care provider. Document Revised: 08/12/2021 Document Reviewed: 08/12/2021 Elsevier Patient Education  2024 [employer].

## 2022-12-01 ENCOUNTER — Ambulatory Visit: Payer: 59

## 2022-12-01 ENCOUNTER — Other Ambulatory Visit: Payer: Self-pay

## 2022-12-01 ENCOUNTER — Ambulatory Visit
Admission: RE | Admit: 2022-12-01 | Discharge: 2022-12-01 | Disposition: A | Discharge status: Home / Self Care | Payer: 59 | Attending: Urology | Admitting: Urology

## 2022-12-01 ENCOUNTER — Encounter
Admission: RE | Disposition: A | Discharge status: Home / Self Care | Payer: Self-pay | Source: Home / Self Care | Attending: Urology

## 2022-12-01 ENCOUNTER — Encounter: Payer: Self-pay | Admitting: Urology

## 2022-12-01 DIAGNOSIS — N201 Calculus of ureter: Secondary | ICD-10-CM | POA: Insufficient documentation

## 2022-12-01 DIAGNOSIS — N202 Calculus of kidney with calculus of ureter: Secondary | ICD-10-CM | POA: Diagnosis not present

## 2022-12-01 HISTORY — PX: EXTRACORPOREAL SHOCK WAVE LITHOTRIPSY: SHX1557

## 2022-12-01 LAB — POCT PREGNANCY, URINE: Preg Test, Ur: NEGATIVE

## 2022-12-01 SURGERY — LITHOTRIPSY, ESWL
Anesthesia: Moderate Sedation | Laterality: Left

## 2022-12-01 MED ORDER — ONDANSETRON HCL 4 MG/2ML IJ SOLN
4.0000 mg | Freq: Once | INTRAMUSCULAR | Status: AC
  Administered 2022-12-01: 4 mg via INTRAVENOUS

## 2022-12-01 MED ORDER — HYDROCODONE-ACETAMINOPHEN 5-325 MG PO TABS: 1.0000 | ORAL_TABLET | Freq: Four times a day (QID) | ORAL | 0 refills | Status: DC | PRN

## 2022-12-01 MED ORDER — CEPHALEXIN 500 MG PO CAPS
ORAL_CAPSULE | ORAL | Status: AC
  Filled 2022-12-01: qty 1

## 2022-12-01 MED ORDER — SODIUM CHLORIDE 0.9 % IV SOLN: INTRAVENOUS | Status: DC

## 2022-12-01 MED ORDER — CEPHALEXIN 500 MG PO CAPS
500.0000 mg | ORAL_CAPSULE | Freq: Once | ORAL | Status: AC
  Administered 2022-12-01: 500 mg via ORAL

## 2022-12-01 MED ORDER — ONDANSETRON HCL 4 MG/2ML IJ SOLN
INTRAMUSCULAR | Status: AC
  Filled 2022-12-01: qty 2

## 2022-12-01 MED ORDER — DIPHENHYDRAMINE HCL 25 MG PO CAPS
25.0000 mg | ORAL_CAPSULE | ORAL | Status: AC
  Administered 2022-12-01: 25 mg via ORAL

## 2022-12-01 MED ORDER — HYDROCODONE-ACETAMINOPHEN 5-325 MG PO TABS
1.0000 | ORAL_TABLET | Freq: Four times a day (QID) | ORAL | 0 refills | Status: DC | PRN
Start: 2022-12-01 — End: 2022-12-29

## 2022-12-01 MED ORDER — TAMSULOSIN HCL 0.4 MG PO CAPS: 0.4000 mg | ORAL_CAPSULE | Freq: Every day | ORAL | 0 refills | Status: DC

## 2022-12-01 MED ORDER — DIAZEPAM 5 MG PO TABS
ORAL_TABLET | ORAL | Status: AC
  Filled 2022-12-01: qty 2

## 2022-12-01 MED ORDER — DIPHENHYDRAMINE HCL 25 MG PO CAPS
ORAL_CAPSULE | ORAL | Status: AC
  Filled 2022-12-01: qty 1

## 2022-12-01 MED ORDER — DIAZEPAM 5 MG PO TABS
10.0000 mg | ORAL_TABLET | ORAL | Status: AC
  Administered 2022-12-01: 10 mg via ORAL

## 2022-12-01 NOTE — Discharge Instructions (Addendum)
See [facility] discharge instructions in chart. AMBULATORY SURGERY  DISCHARGE INSTRUCTIONS   The drugs that you were given will stay in your system until tomorrow so for the next 24 hours you should not:  Drive an automobile Make any legal decisions Drink any alcoholic beverage   You may resume regular meals tomorrow.  Today it is better to start with liquids and gradually work up to solid foods.  You may eat anything you prefer, but it is better to start with liquids, then soup and crackers, and gradually work up to solid foods.   Please notify your doctor immediately if you have any unusual bleeding, trouble breathing, redness and pain at the surgery site, drainage, fever, or pain not relieved by medication.    Additional Instructions:        Please contact your physician with any problems or Same Day Surgery at [phone], Monday through Friday 6 am to 4 pm, or  at [facility] Main number at [phone].  

## 2022-12-01 NOTE — Interval H&P Note (Signed)
History and Physical Interval Note:  12/01/2022 11:51 AM  Eileen Vargas  has presented today for surgery, with the diagnosis of Left Ureteral Stone.  The various methods of treatment have been discussed with the patient and family. After consideration of risks, benefits and other options for treatment, the patient has consented to  Procedure(s): EXTRACORPOREAL SHOCK WAVE LITHOTRIPSY (ESWL) (Left) as a surgical intervention.  The patient's history has been reviewed, patient examined, no change in status, stable for surgery.  I have reviewed the patient's chart and labs.  Questions were answered to the patient's satisfaction.    RRR CTAb   [name]

## 2022-12-02 ENCOUNTER — Inpatient Hospital Stay: Payer: 59

## 2022-12-02 VITALS — BP 135/78 | HR 64 | Temp 99.1°F | Resp 16

## 2022-12-02 DIAGNOSIS — D509 Iron deficiency anemia, unspecified: Secondary | ICD-10-CM | POA: Diagnosis not present

## 2022-12-02 DIAGNOSIS — D508 Other iron deficiency anemias: Secondary | ICD-10-CM

## 2022-12-02 DIAGNOSIS — Z801 Family history of malignant neoplasm of trachea, bronchus and lung: Secondary | ICD-10-CM | POA: Diagnosis not present

## 2022-12-02 DIAGNOSIS — D573 Sickle-cell trait: Secondary | ICD-10-CM | POA: Diagnosis not present

## 2022-12-02 DIAGNOSIS — D259 Leiomyoma of uterus, unspecified: Secondary | ICD-10-CM | POA: Diagnosis not present

## 2022-12-02 DIAGNOSIS — K760 Fatty (change of) liver, not elsewhere classified: Secondary | ICD-10-CM | POA: Diagnosis not present

## 2022-12-02 DIAGNOSIS — Z79899 Other long term (current) drug therapy: Secondary | ICD-10-CM | POA: Diagnosis not present

## 2022-12-02 DIAGNOSIS — R634 Abnormal weight loss: Secondary | ICD-10-CM | POA: Diagnosis not present

## 2022-12-02 MED ORDER — SODIUM CHLORIDE 0.9 % IV SOLN
200.0000 mg | Freq: Once | INTRAVENOUS | Status: AC
  Administered 2022-12-02: 200 mg via INTRAVENOUS
  Filled 2022-12-02: qty 200

## 2022-12-02 MED ORDER — SODIUM CHLORIDE 0.9 % IV SOLN
Freq: Once | INTRAVENOUS | Status: AC
  Filled 2022-12-02: qty 250

## 2022-12-05 ENCOUNTER — Other Ambulatory Visit: Payer: Self-pay

## 2022-12-05 DIAGNOSIS — N201 Calculus of ureter: Secondary | ICD-10-CM

## 2022-12-20 NOTE — Progress Notes (Signed)
12/21/2022 6:11 PM   Eileen Vargas 1991-02-16 [id number]  Referring provider: Jerrol Banana, MD [address] [place],  [place] [address]  Urological history: 1. Nephrolithiasis  Chief Complaint  Patient presents with   Follow-up   Nephrolithiasis   HPI: Eileen Vargas is a 32 y.o. who is status post ESWL who presents today for follow up.  Underwent ESWL on 12/01/2022 for left ESWL with Dr. Apolinar Junes.  Their postprocedural course was as expected and uneventful.   They hav passed fragments.    They bring in fragments for analysis.   KUB fragment no longer visualized.   Patient denies any modifying or aggravating factors.  Patient denies any recent UTI's, gross hematuria, dysuria or suprapubic/flank pain.  Patient denies any fevers, chills, nausea or vomiting.    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    Surgical History: Past Surgical History:  Procedure Laterality Date   CHOLECYSTECTOMY  2013   EXTRACORPOREAL SHOCK WAVE LITHOTRIPSY Left 12/01/2022   Procedure: EXTRACORPOREAL SHOCK WAVE LITHOTRIPSY (ESWL);  Surgeon: [name], MD;  Location: [facility] ORS;  Service: Urology;  Laterality: Left;    Home Medications:  Current Outpatient Medications on File Prior to Visit  Medication Sig Dispense Refill   citalopram (CELEXA) 20 MG tablet TAKE 1 TABLET BY MOUTH EVERY DAY 90 tablet 1   HYDROcodone-acetaminophen (NORCO/VICODIN) 5-325 MG tablet Take 1-2 tablets by mouth every 6 (six) hours as needed for moderate pain. 10 tablet 0   Iron Sucrose (VENOFER IV) Inject 200 mg into the vein every 7 (seven) days.     Iron-Vitamin C (VITRON-C) 65-125 MG TABS Take 1 tablet by mouth daily. 90 tablet 1   omeprazole (PRILOSEC) 40 MG capsule Take 1 capsule (40 mg total) by mouth daily as needed. 90 capsule 0   ondansetron (ZOFRAN-ODT) 4 MG  disintegrating tablet Take 1 tablet (4 mg total) by mouth every 8 (eight) hours as needed for nausea or vomiting. 12 tablet 0   oxyCODONE-acetaminophen (PERCOCET) 5-325 MG tablet Take 1 tablet by mouth every 4 (four) hours as needed for severe pain. 12 tablet 0   tamsulosin (FLOMAX) 0.4 MG CAPS capsule Take 1 capsule (0.4 mg total) by mouth daily. 30 capsule 0   Vitamin D, Ergocalciferol, (DRISDOL) 1.25 MG (50000 UNIT) CAPS capsule Take 1 capsule (50,000 Units total) by mouth every 7 (seven) days. Take for 8 total doses(weeks) 8 capsule 0   No current facility-administered medications on file prior to visit.    Allergies: No Known Allergies  Family History: Family History  Problem Relation Age of Onset   Diabetes Maternal Grandmother    Lung cancer Maternal Grandfather    Kidney disease Mother    Clotting disorder Father    Sickle cell trait Father    Early death Father     Social History:  reports that she has never smoked. She has never been exposed to tobacco smoke. She has never used smokeless tobacco. She reports that she does not currently use alcohol after a past usage of about 1.0 standard drink of alcohol per week. She reports that she does not use drugs.  ROS: Pertinent ROS in HPI  Physical Exam: BP (!) 164/106   Pulse 79   Ht 5\' 4"  (1.626 m)   Wt 240 lb (108.9 kg)   LMP 11/17/2022 (  Exact Date) Comment: neg preg test on 12/01/22  BMI 41.20 kg/m   Constitutional:  Well nourished. Alert and oriented, No acute distress. HEENT: [place] AT, mask in place.   Trachea midline. Cardiovascular: No clubbing, cyanosis, or edema. Respiratory: Normal respiratory effort, no increased work of breathing. Neurologic: Grossly intact, no focal deficits, moving all 4 extremities. Psychiatric: Normal mood and affect.  Laboratory Data: Lab Results  Component Value Date   WBC 7.0 11/28/2022   HGB 10.9 (L) 11/28/2022   HCT 33.5 (L) 11/28/2022   MCV 82.9 11/28/2022   PLT 282 11/28/2022     Lab Results  Component Value Date   CREATININE 0.89 11/14/2022       Component Value Date/Time   CHOL 151 10/11/2022 1109   HDL 42 10/11/2022 1109   CHOLHDL 3.6 10/11/2022 1109   LDLCALC 92 10/11/2022 1109    Urinalysis    Component Value Date/Time   COLORURINE YELLOW 11/30/2022 1513   APPEARANCEUR CLEAR 11/30/2022 1513   LABSPEC 1.015 11/30/2022 1513   PHURINE 5.5 11/30/2022 1513   GLUCOSEU NEGATIVE 11/30/2022 1513   HGBUR TRACE (A) 11/30/2022 1513   BILIRUBINUR NEGATIVE 11/30/2022 1513   KETONESUR NEGATIVE 11/30/2022 1513   PROTEINUR NEGATIVE 11/30/2022 1513   NITRITE NEGATIVE 11/30/2022 1513   LEUKOCYTESUR SMALL (A) 11/30/2022 1513  I have reviewed the labs.   Pertinent Imaging: CLINICAL DATA:  Follow up stones   EXAM: ABDOMEN - 1 VIEW   COMPARISON:  12/01/2022.   FINDINGS: The bowel gas pattern is normal. 3-4 mm calcification overlies the left kidney. Calcification overlying the left midabdomen observed previously is no longer seen.   IMPRESSION: Barely perceptible left-sided nephrolithiasis. Stone that had been overlying the left mid ureter is no longer seen.     Electronically Signed   By: Layla Maw M.D.   On: 12/21/2022 23:54 I have independently reviewed the films.    Assessment & Plan:    1. Left ureteral stone -left ureteral stone no longer visible on today's Xray  -stone fragments sent for analysis   Return for Patient will be contacted with stone analysis report .  These notes generated with voice recognition software. I apologize for typographical errors.  Cloretta Ned  [facility] [address]  [address] [place], [place] [address] [phone]

## 2022-12-21 ENCOUNTER — Ambulatory Visit (INDEPENDENT_AMBULATORY_CARE_PROVIDER_SITE_OTHER): Payer: 59 | Admitting: Urology

## 2022-12-21 ENCOUNTER — Ambulatory Visit
Admission: RE | Admit: 2022-12-21 | Discharge: 2022-12-21 | Disposition: A | Discharge status: Home / Self Care | Payer: 59 | Source: Ambulatory Visit | Attending: Urology | Admitting: Urology

## 2022-12-21 VITALS — BP 164/106 | HR 79 | Ht 64.0 in | Wt 240.0 lb

## 2022-12-21 DIAGNOSIS — N201 Calculus of ureter: Secondary | ICD-10-CM

## 2022-12-21 DIAGNOSIS — Z09 Encounter for follow-up examination after completed treatment for conditions other than malignant neoplasm: Secondary | ICD-10-CM

## 2022-12-21 DIAGNOSIS — N200 Calculus of kidney: Secondary | ICD-10-CM | POA: Diagnosis not present

## 2022-12-21 DIAGNOSIS — Z87442 Personal history of urinary calculi: Secondary | ICD-10-CM

## 2022-12-21 DIAGNOSIS — N2889 Other specified disorders of kidney and ureter: Secondary | ICD-10-CM | POA: Diagnosis not present

## 2022-12-23 ENCOUNTER — Other Ambulatory Visit: Payer: Self-pay | Admitting: Urology

## 2022-12-25 ENCOUNTER — Other Ambulatory Visit: Payer: Self-pay | Admitting: Family Medicine

## 2022-12-25 DIAGNOSIS — F32A Depression, unspecified: Secondary | ICD-10-CM

## 2022-12-27 ENCOUNTER — Encounter: Payer: Self-pay | Admitting: Oncology

## 2022-12-28 DIAGNOSIS — N920 Excessive and frequent menstruation with regular cycle: Secondary | ICD-10-CM | POA: Insufficient documentation

## 2022-12-28 NOTE — Telephone Encounter (Signed)
Requested Prescriptions  Pending Prescriptions Disp Refills   citalopram (CELEXA) 20 MG tablet [Pharmacy Med Name: CITALOPRAM HBR 20 MG TABLET] 30 tablet 5    Sig: TAKE 1 TABLET BY MOUTH EVERY DAY     Psychiatry:  Antidepressants - SSRI Passed - 12/25/2022  8:45 AM      Passed - Completed PHQ-2 or PHQ-9 in the last 360 days      Passed - Valid encounter within last 6 months    Recent Outpatient Visits           2 months ago Healthcare maintenance   [facility] at [facility], Ocie Bob, MD   5 months ago Right lumbosacral radiculopathy   [place] [facility], Ocie Bob, MD   7 months ago Right lumbosacral radiculopathy   [place] [facility], Ocie Bob, MD   9 months ago Right lumbosacral radiculopathy   [place] [facility], Ocie Bob, MD   1 year ago Annual physical exam   [facility] at [facility], Ocie Bob, MD       Future Appointments             In 9 months Ashley Royalty, Ocie Bob, MD [facility] at [facility], [facility]

## 2022-12-28 NOTE — Progress Notes (Signed)
MRN : [id number]  Carrianne Cutting is a 32 y.o. (11-Apr-1991) female who presents with chief complaint of check circulation.  History of Present Illness:   I am asked to see the patient by Dr. Logan Bores.  Patient is a 32 year old woman with heavy menstrual cycles and increasing cramping pain associated with anemia.  She was seen by Dr. Logan Bores in May 2024 at which time various methods for treatment of her mental menorrhagia and pelvic pain were discussed.  The patient consider these treatment options which included hormonal control, UFE, endometrial ablation and surgical intervention including hysterectomy.  At this point she presents today for discussion regarding UFE.  She continues to have heavy menstrual bleeding difficulties with anemia she is required transfusions as well as iron therapy.  No outpatient medications have been marked as taking for the 12/29/22 encounter (Appointment) with Gilda Crease, Latina Craver, MD.    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    Past Surgical History:  Procedure Laterality Date   CHOLECYSTECTOMY  2013   EXTRACORPOREAL SHOCK WAVE LITHOTRIPSY Left 12/01/2022   Procedure: EXTRACORPOREAL SHOCK WAVE LITHOTRIPSY (ESWL);  Surgeon: [name], MD;  Location: [facility] ORS;  Service: Urology;  Laterality: Left;    Social History Social History   Tobacco Use   Smoking status: Never    Passive exposure: Never   Smokeless tobacco: Never  Vaping Use   Vaping status: Never Used  Substance Use Topics   Alcohol use: Not Currently    Alcohol/week: 1.0 standard drink of alcohol    Types: 1 Standard drinks or equivalent per week    Comment: Max numbers only   Drug use: Never    Family History Family History  Problem Relation Age of Onset   Diabetes Maternal  Grandmother    Lung cancer Maternal Grandfather    Kidney disease Mother    Clotting disorder Father    Sickle cell trait Father    Early death Father     No Known Allergies   REVIEW OF SYSTEMS (Negative unless checked)  Constitutional: [] Weight loss  [] Fever  [] Chills Cardiac: [] Chest pain   [] Chest pressure   [] Palpitations   [] Shortness of breath when laying flat   [] Shortness of breath with exertion. Vascular:  [x] Pain in legs with walking   [] Pain in legs at rest  [] History of DVT   [] Phlebitis   [] Swelling in legs   [] Varicose veins   [] Non-healing ulcers Pulmonary:   [] Uses home oxygen   [] Productive cough   [] Hemoptysis   [] Wheeze  [] COPD   [] Asthma Neurologic:  [] Dizziness   [] Seizures   [] History of stroke   [] History of TIA  [] Aphasia   [] Vissual changes   [] Weakness or numbness in arm   [] Weakness or numbness in leg Musculoskeletal:   [] Joint swelling   [] Joint pain   [] Low back pain Hematologic:  [] Easy bruising  [] Easy bleeding   [] Hypercoagulable state   [] Anemic Gastrointestinal:  [] Diarrhea   []   Vomiting  [x] Gastroesophageal reflux/heartburn   [] Difficulty swallowing. Genitourinary:  [] Chronic kidney disease   [] Difficult urination  [] Frequent urination   [] Blood in urine Skin:  [] Rashes   [] Ulcers  Psychological:  [] History of anxiety   []  History of major depression.  Physical Examination  There were no vitals filed for this visit. There is no height or weight on file to calculate BMI. Gen: WD/WN, NAD Head: [place]/AT, No temporalis wasting.  Ear/Nose/Throat: Hearing grossly intact, nares w/o erythema or drainage Eyes: PER, EOMI, sclera nonicteric.  Neck: Supple, no masses.  No bruit or JVD.  Pulmonary:  Good air movement, no audible wheezing, no use of accessory muscles.  Cardiac: RRR, normal S1, S2, no Murmurs. Vascular:  mild trophic changes, no open wounds Vessel Right Left  Radial Palpable Palpable  Gastrointestinal: soft, non-distended. No guarding/no  peritoneal signs.  Musculoskeletal: M/S 5/5 throughout.  No visible deformity.  Neurologic: CN 2-12 intact. Pain and light touch intact in extremities.  Symmetrical.  Speech is fluent. Motor exam as listed above. Psychiatric: Judgment intact, Mood & affect appropriate for pt's clinical situation. Dermatologic: No rashes or ulcers noted.  No changes consistent with cellulitis.   CBC Lab Results  Component Value Date   WBC 7.0 11/28/2022   HGB 10.9 (L) 11/28/2022   HCT 33.5 (L) 11/28/2022   MCV 82.9 11/28/2022   PLT 282 11/28/2022    BMET    Component Value Date/Time   NA 135 11/14/2022 0251   NA 139 10/11/2022 1109   K 3.5 11/14/2022 0251   CL 106 11/14/2022 0251   CO2 22 11/14/2022 0251   GLUCOSE 137 (H) 11/14/2022 0251   BUN 9 11/14/2022 0251   BUN 10 10/11/2022 1109   CREATININE 0.89 11/14/2022 0251   CALCIUM 8.6 (L) 11/14/2022 0251   GFRNONAA >60 11/14/2022 0251   CrCl cannot be calculated (Patient's most recent lab result is older than the maximum 21 days allowed.).  COAG No results found for: "INR", "PROTIME"  Radiology Abdomen 1 view (KUB)  Result Date: 12/21/2022 CLINICAL DATA:  Follow up stones EXAM: ABDOMEN - 1 VIEW COMPARISON:  12/01/2022. FINDINGS: The bowel gas pattern is normal. 3-4 mm calcification overlies the left kidney. Calcification overlying the left midabdomen observed previously is no longer seen. IMPRESSION: Barely perceptible left-sided nephrolithiasis. Stone that had been overlying the left mid ureter is no longer seen. Electronically Signed   By: Layla Maw M.D.   On: 12/21/2022 23:54   DG Abd 1 View  Result Date: 12/01/2022 CLINICAL DATA:  Flank pain EXAM: ABDOMEN - 1 VIEW COMPARISON:  L-spine x-ray 08/26/2021. CT 11/14/2022. FINDINGS: Increased stool noted consistent with constipation. No bowel dilatation identified. Surgical clips in the right upper quadrant. Mid left ureteral stone observed on the prior CT scan from 11/14/2022 measures  about 6 mm adjacent to L4. Punctate calcifications overlie the left kidney consistent with intrarenal stones observed on the CT scan. The larger left-sided renal stone observed previously has progressed to the mid ureter. IMPRESSION: Constipation. Left-sided nephrolithiasis and mid left ureteral 6 mm stone. Electronically Signed   By: Layla Maw M.D.   On: 12/01/2022 10:43   DG Abd 1 View  Result Date: 12/01/2022 CLINICAL DATA:  Left ureteral stone EXAM: ABDOMEN - 1 VIEW COMPARISON:  11/30/2022 and CT 11/14/2022. FINDINGS: Increased stool noted consistent with constipation. No bowel dilatation identified. Surgical clips in the right upper quadrant. Mid left ureteral stone observed on the prior CT scan from 11/14/2022 is barely  perceptible on this study measuring about 6 mm adjacent to L4. Punctate calcification overlies the left kidney consistent with intrarenal stones observed on the CT scan. The CT scan was more definitive with respect to the presence of stones. IMPRESSION: Left-sided nephrolithiasis and mid ureteral stone are barely perceptible on plain film. Electronically Signed   By: Layla Maw M.D.   On: 12/01/2022 10:40     Assessment/Plan 1. Excessive bleeding in premenopausal period The patient is having excessive uterine bleeding which is resulted in anemia and blood transfusions in the past.  This is an ongoing problem.  She has discussed this with her OB/GYN as well as with myself.  Of the options available including medical treatment, embolization of the fibroids and surgery including hysterectomy she is considering embolization.  The risks and benefits have been reviewed the discussion focused on the techniques of the procedure and the pre and postoperative care.  All questions have been answered.  Patient wishes to consider this and will follow-up at her convenience.  2. Uterine leiomyoma, unspecified location 1. Excessive bleeding in premenopausal period The patient is  having excessive uterine bleeding which is resulted in anemia and blood transfusions in the past.  This is an ongoing problem.  She has discussed this with her OB/GYN as well as with myself.  Of the options available including medical treatment, embolization of the fibroids and surgery including hysterectomy she is considering embolization.  The risks and benefits have been reviewed the discussion focused on the techniques of the procedure and the pre and postoperative care.  All questions have been answered.  Patient wishes to consider this and will follow-up at her convenience.  3. Gastroesophageal reflux disease without esophagitis Continue PPI as already ordered, this medication has been reviewed and there are no changes at this time.  Avoidence of caffeine and alcohol  Moderate elevation of the head of the bed   4. Right lumbosacral radiculopathy Continue NSAID medications as already ordered, these medications have been reviewed and there are no changes at this time.  Continued activity and therapy was stressed.  5. Mixed hyperlipidemia Continue statin as ordered and reviewed, no changes at this time   Levora Dredge, MD  12/28/2022 8:39 AM

## 2022-12-29 ENCOUNTER — Ambulatory Visit (INDEPENDENT_AMBULATORY_CARE_PROVIDER_SITE_OTHER): Payer: 59 | Admitting: Vascular Surgery

## 2022-12-29 ENCOUNTER — Encounter (INDEPENDENT_AMBULATORY_CARE_PROVIDER_SITE_OTHER): Payer: Self-pay | Admitting: Vascular Surgery

## 2022-12-29 VITALS — BP 153/90 | HR 69 | Resp 18 | Ht 64.0 in | Wt 243.0 lb

## 2022-12-29 DIAGNOSIS — M5417 Radiculopathy, lumbosacral region: Secondary | ICD-10-CM | POA: Diagnosis not present

## 2022-12-29 DIAGNOSIS — K219 Gastro-esophageal reflux disease without esophagitis: Secondary | ICD-10-CM | POA: Diagnosis not present

## 2022-12-29 DIAGNOSIS — D259 Leiomyoma of uterus, unspecified: Secondary | ICD-10-CM

## 2022-12-29 DIAGNOSIS — E782 Mixed hyperlipidemia: Secondary | ICD-10-CM | POA: Diagnosis not present

## 2022-12-29 DIAGNOSIS — N924 Excessive bleeding in the premenopausal period: Secondary | ICD-10-CM

## 2023-01-02 ENCOUNTER — Inpatient Hospital Stay: Payer: 59

## 2023-01-02 ENCOUNTER — Inpatient Hospital Stay: Payer: 59 | Attending: Oncology

## 2023-01-02 DIAGNOSIS — D509 Iron deficiency anemia, unspecified: Secondary | ICD-10-CM | POA: Insufficient documentation

## 2023-01-02 DIAGNOSIS — D500 Iron deficiency anemia secondary to blood loss (chronic): Secondary | ICD-10-CM

## 2023-01-02 LAB — CALCULI, WITH PHOTOGRAPH (CLINICAL LAB)
Calcium Oxalate Dihydrate: 10 %
Calcium Oxalate Monohydrate: 90 %
Weight Calculi: 65 mg

## 2023-01-02 LAB — HEMOGLOBIN AND HEMATOCRIT (CANCER CENTER ONLY)
HCT: 35.2 % — ABNORMAL LOW (ref 36.0–46.0)
Hemoglobin: 11.6 g/dL — ABNORMAL LOW (ref 12.0–15.0)

## 2023-01-24 ENCOUNTER — Other Ambulatory Visit: Payer: Self-pay

## 2023-01-24 ENCOUNTER — Inpatient Hospital Stay: Payer: 59 | Attending: Oncology

## 2023-01-24 DIAGNOSIS — Z801 Family history of malignant neoplasm of trachea, bronchus and lung: Secondary | ICD-10-CM | POA: Insufficient documentation

## 2023-01-24 DIAGNOSIS — D508 Other iron deficiency anemias: Secondary | ICD-10-CM

## 2023-01-24 DIAGNOSIS — E785 Hyperlipidemia, unspecified: Secondary | ICD-10-CM | POA: Diagnosis not present

## 2023-01-24 DIAGNOSIS — Z79899 Other long term (current) drug therapy: Secondary | ICD-10-CM | POA: Insufficient documentation

## 2023-01-24 DIAGNOSIS — K219 Gastro-esophageal reflux disease without esophagitis: Secondary | ICD-10-CM | POA: Diagnosis not present

## 2023-01-24 DIAGNOSIS — D509 Iron deficiency anemia, unspecified: Secondary | ICD-10-CM | POA: Diagnosis not present

## 2023-01-24 DIAGNOSIS — D500 Iron deficiency anemia secondary to blood loss (chronic): Secondary | ICD-10-CM

## 2023-01-24 DIAGNOSIS — D573 Sickle-cell trait: Secondary | ICD-10-CM | POA: Diagnosis not present

## 2023-01-24 DIAGNOSIS — Z23 Encounter for immunization: Secondary | ICD-10-CM | POA: Diagnosis not present

## 2023-01-24 DIAGNOSIS — N920 Excessive and frequent menstruation with regular cycle: Secondary | ICD-10-CM | POA: Diagnosis not present

## 2023-01-24 LAB — HEMOGLOBIN AND HEMATOCRIT (CANCER CENTER ONLY)
HCT: 35.1 % — ABNORMAL LOW (ref 36.0–46.0)
Hemoglobin: 11.6 g/dL — ABNORMAL LOW (ref 12.0–15.0)

## 2023-01-24 LAB — IRON AND TIBC
Iron: 70 ug/dL (ref 28–170)
Saturation Ratios: 18 % (ref 10.4–31.8)
TIBC: 388 ug/dL (ref 250–450)
UIBC: 318 ug/dL

## 2023-01-24 LAB — FERRITIN: Ferritin: 30 ng/mL (ref 11–307)

## 2023-01-27 ENCOUNTER — Encounter: Payer: Self-pay | Admitting: Nurse Practitioner

## 2023-01-27 ENCOUNTER — Inpatient Hospital Stay: Payer: 59

## 2023-01-27 ENCOUNTER — Other Ambulatory Visit: Payer: 59

## 2023-01-27 ENCOUNTER — Inpatient Hospital Stay (HOSPITAL_BASED_OUTPATIENT_CLINIC_OR_DEPARTMENT_OTHER): Payer: 59 | Admitting: Nurse Practitioner

## 2023-01-27 VITALS — BP 135/87 | HR 63 | Resp 20

## 2023-01-27 VITALS — BP 151/94 | HR 69 | Temp 98.0°F | Wt 236.0 lb

## 2023-01-27 DIAGNOSIS — D509 Iron deficiency anemia, unspecified: Secondary | ICD-10-CM | POA: Diagnosis not present

## 2023-01-27 DIAGNOSIS — Z79899 Other long term (current) drug therapy: Secondary | ICD-10-CM | POA: Diagnosis not present

## 2023-01-27 DIAGNOSIS — K219 Gastro-esophageal reflux disease without esophagitis: Secondary | ICD-10-CM | POA: Diagnosis not present

## 2023-01-27 DIAGNOSIS — Z23 Encounter for immunization: Secondary | ICD-10-CM

## 2023-01-27 DIAGNOSIS — D573 Sickle-cell trait: Secondary | ICD-10-CM | POA: Diagnosis not present

## 2023-01-27 DIAGNOSIS — E785 Hyperlipidemia, unspecified: Secondary | ICD-10-CM | POA: Diagnosis not present

## 2023-01-27 DIAGNOSIS — D500 Iron deficiency anemia secondary to blood loss (chronic): Secondary | ICD-10-CM

## 2023-01-27 DIAGNOSIS — N920 Excessive and frequent menstruation with regular cycle: Secondary | ICD-10-CM | POA: Diagnosis not present

## 2023-01-27 DIAGNOSIS — D508 Other iron deficiency anemias: Secondary | ICD-10-CM

## 2023-01-27 DIAGNOSIS — Z801 Family history of malignant neoplasm of trachea, bronchus and lung: Secondary | ICD-10-CM | POA: Diagnosis not present

## 2023-01-27 MED ORDER — INFLUENZA VIRUS VACC SPLIT PF (FLUZONE) 0.5 ML IM SUSY
0.5000 mL | PREFILLED_SYRINGE | Freq: Once | INTRAMUSCULAR | Status: AC
  Administered 2023-01-27: 0.5 mL via INTRAMUSCULAR
  Filled 2023-01-27: qty 0.5

## 2023-01-27 MED ORDER — SODIUM CHLORIDE 0.9 % IV SOLN
200.0000 mg | Freq: Once | INTRAVENOUS | Status: AC
  Administered 2023-01-27: 200 mg via INTRAVENOUS
  Filled 2023-01-27: qty 200

## 2023-01-27 MED ORDER — SODIUM CHLORIDE 0.9 % IV SOLN
INTRAVENOUS | Status: DC
  Filled 2023-01-27: qty 250

## 2023-01-27 NOTE — Progress Notes (Signed)
[facility]  Telephone:(336) [phone] Fax:(336) [phone]  ID: Eileen Vargas OB: 10/07/1990  MR#: [id number]  AOZ#:[id number]  Patient Care Team: Jerrol Banana, MD as PCP - General (Family Medicine)  CHIEF COMPLAINT: Iron deficiency anemia  INTERVAL HISTORY: Patient is a 32 year old female with history of iron deficiency anemia secondary to heavy menses who returns to clinic for follow-up. She reports increased fatigue over past month and feels like she has similar symptoms as to when she had previous drop in her hemoglobin and iron levels. Feels like her fatigue is disproportionate to her exertion. She continues to have heavy menstrual bleeding secondary to fibroids but it has improved slightly this month. She continues to lose weight intentionally. Denies any neurologic complaints. Denies recent fevers or illnesses. Denies any easy bleeding or bruising. No melena or hematochezia. No pica or restless leg. Reports good appetite. Denies chest pain. Denies any nausea, vomiting, constipation, or diarrhea. Denies urinary complaints. Patient offers no further specific complaints today.   REVIEW OF SYSTEMS:   Review of Systems  Constitutional:  Positive for malaise/fatigue. Negative for chills, fever and weight loss.  Respiratory:  Negative for cough and shortness of breath.   Cardiovascular:  Negative for chest pain, palpitations, orthopnea and claudication.  Gastrointestinal:  Negative for abdominal pain, blood in stool, constipation, diarrhea, melena, nausea and vomiting.  Genitourinary:  Negative for frequency and hematuria.       Fibroids  Musculoskeletal:  Negative for back pain, joint pain, myalgias and neck pain.  Skin: Negative.   Neurological:  Negative for dizziness, tingling, weakness and headaches.  Endo/Heme/Allergies:  Does not bruise/bleed easily.  Psychiatric/Behavioral:  Negative for depression. The patient is not nervous/anxious.   As per HPI. Otherwise, a  complete review of systems is negative.  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    Patient Active Problem List   Diagnosis Date Noted   Menorrhagia 12/28/2022   Elevated fasting glucose 10/11/2022   Symptomatic anemia 08/23/2022   Need for immunization against influenza 03/29/2022   Annual physical exam 08/26/2021   Right lumbosacral radiculopathy 08/12/2021   Fibroid, uterine 08/10/2021   Hyperlipidemia 02/08/2021   Anxiety and depression 07/13/2020   Gastroesophageal reflux disease without esophagitis 07/13/2020   Fibroid 07/13/2020   Class 3 severe obesity due to excess calories with body mass index (BMI) of 45.0 to 49.9 in adult (HCC) 07/13/2020   Sickle cell trait (HCC) 07/01/2020   Iron deficiency anemia 06/06/2020   PAST SURGICAL HISTORY: Past Surgical History:  Procedure Laterality Date   CHOLECYSTECTOMY  2013   EXTRACORPOREAL SHOCK WAVE LITHOTRIPSY Left 12/01/2022   Procedure: EXTRACORPOREAL SHOCK WAVE LITHOTRIPSY (ESWL);  Surgeon: [name], MD;  Location: [facility] ORS;  Service: Urology;  Laterality: Left;    FAMILY HISTORY: Family History  Problem Relation Age of Onset   Diabetes Maternal Grandmother    Lung cancer Maternal Grandfather    Kidney disease Mother    Clotting disorder Father    Sickle cell trait Father    Early death Father     ADVANCED DIRECTIVES (Y/N):  N  HEALTH MAINTENANCE: Social History   Tobacco Use   Smoking status: Never    Passive exposure: Never   Smokeless tobacco: Never  Vaping Use   Vaping status: Never Used  Substance Use Topics  Alcohol use: Not Currently    Alcohol/week: 1.0 standard drink of alcohol    Types: 1 Standard drinks or equivalent per week    Comment: Max numbers only   Drug use: Never    Colonoscopy:  PAP:  Bone density:  Lipid panel:  No  Known Allergies  Current Outpatient Medications  Medication Sig Dispense Refill   citalopram (CELEXA) 20 MG tablet TAKE 1 TABLET BY MOUTH EVERY DAY 30 tablet 5   Iron Sucrose (VENOFER IV) Inject 200 mg into the vein every 7 (seven) days.     Iron-Vitamin C (VITRON-C) 65-125 MG TABS Take 1 tablet by mouth daily. 90 tablet 1   omeprazole (PRILOSEC) 40 MG capsule Take 1 capsule (40 mg total) by mouth daily as needed. 90 capsule 0   Vitamin D, Ergocalciferol, (DRISDOL) 1.25 MG (50000 UNIT) CAPS capsule Take 1 capsule (50,000 Units total) by mouth every 7 (seven) days. Take for 8 total doses(weeks) 8 capsule 0   No current facility-administered medications for this visit.    OBJECTIVE: Vitals:   01/27/23 1439  BP: (!) 151/94  Pulse: 69  Temp: 98 F (36.7 C)  SpO2: 100%      Body mass index is 40.51 kg/m.    ECOG FS:1 - Symptomatic but completely ambulatory  General: Well-developed, well-nourished, no acute distress. Lungs: No audible wheezing or coughing Heart: Regular rate and rhythm.  Abdomen: Soft, nontender, nondistended.  Musculoskeletal: No edema, cyanosis, or clubbing. Neuro: Alert, answering all questions appropriately. Cranial nerves grossly intact. Skin: No rashes or petechiae noted.  Psych: Normal affect.   LAB RESULTS: Lab Results  Component Value Date   WBC 7.0 11/28/2022   NEUTROABS 4.0 11/28/2022   HGB 11.6 (L) 01/24/2023   HCT 35.1 (L) 01/24/2023   MCV 82.9 11/28/2022   PLT 282 11/28/2022   Lab Results  Component Value Date   IRON 70 01/24/2023   TIBC 388 01/24/2023   IRONPCTSAT 18 01/24/2023   Lab Results  Component Value Date   FERRITIN 30 01/24/2023    STUDIES: No results found.  ASSESSMENT: Iron deficiency anemia.  PLAN:    1.  Iron deficiency anemia: etiology secondary to heavy menses. Last IV iron 09/26/22. Previously discussed switching from venofer to monoferric but does not appear it was approved by insurance. She will continue venofer.  Hemoglobin dropped to 5.2 in April 2024- hospitalization, 2 units pRBCs and 300 mg IV venofer in hospital. Hemoglobin improved to 11. Last received venofer 12/02/22. Hemoglobin 11.6. Ferritin 30, down. Iron sat 18%, down. Increasing symptoms. Plan for venofer today then 2 additional doses weekly. We have been checking her hemoglobin monthly given her previous drop. She's recently shown more stability. Plan to check cbc in 6 weeks then iron studies with cbc in 12 weeks.   2.  Sickle cell trait: No intervention needed.  She does not have any children at this time.  Likely the etiology of her mild baseline anemia.  3. Heavy menses- hx of fibroids. Previously saw Dr. Bonney Aid and discussed options for management in March 2022. S/p evaluation with Dr. Logan Bores at [facility]. Awaiting appointment with vascular for embolization at [facility]. Recommended she call them to follow up.   Disposition: Venofer today 2 additional doses of venofer over next 2 weeks 6 weeks- lab (cbc) 12 weeks- lab (cbc, ferritin, iron studies) Day to 3 days later see me +/- venofer- la  I spent a total of 20 minutes reviewing chart data, face-to-face evaluation with  the patient, counseling and coordination of care as detailed above.  Patient expressed understanding and was in agreement with this plan. She also understands that She can call clinic at any time with any questions, concerns, or complaints.   Thank you for allowing me to participate in the care of this very pleasant patient.   Alinda Dooms, NP  01/27/2023

## 2023-01-27 NOTE — Patient Instructions (Signed)

## 2023-01-30 ENCOUNTER — Ambulatory Visit: Payer: 59

## 2023-01-30 ENCOUNTER — Ambulatory Visit: Payer: 59 | Admitting: Nurse Practitioner

## 2023-02-03 ENCOUNTER — Inpatient Hospital Stay: Payer: 59

## 2023-02-06 ENCOUNTER — Inpatient Hospital Stay: Payer: 59

## 2023-02-06 VITALS — BP 138/91 | HR 66 | Temp 98.3°F | Resp 18

## 2023-02-06 DIAGNOSIS — D509 Iron deficiency anemia, unspecified: Secondary | ICD-10-CM | POA: Diagnosis not present

## 2023-02-06 DIAGNOSIS — E785 Hyperlipidemia, unspecified: Secondary | ICD-10-CM | POA: Diagnosis not present

## 2023-02-06 DIAGNOSIS — N920 Excessive and frequent menstruation with regular cycle: Secondary | ICD-10-CM | POA: Diagnosis not present

## 2023-02-06 DIAGNOSIS — D508 Other iron deficiency anemias: Secondary | ICD-10-CM

## 2023-02-06 DIAGNOSIS — K219 Gastro-esophageal reflux disease without esophagitis: Secondary | ICD-10-CM | POA: Diagnosis not present

## 2023-02-06 DIAGNOSIS — D573 Sickle-cell trait: Secondary | ICD-10-CM | POA: Diagnosis not present

## 2023-02-06 DIAGNOSIS — Z801 Family history of malignant neoplasm of trachea, bronchus and lung: Secondary | ICD-10-CM | POA: Diagnosis not present

## 2023-02-06 DIAGNOSIS — Z79899 Other long term (current) drug therapy: Secondary | ICD-10-CM | POA: Diagnosis not present

## 2023-02-06 DIAGNOSIS — Z23 Encounter for immunization: Secondary | ICD-10-CM | POA: Diagnosis not present

## 2023-02-06 MED ORDER — SODIUM CHLORIDE 0.9 % IV SOLN
200.0000 mg | Freq: Once | INTRAVENOUS | Status: AC
  Administered 2023-02-06: 200 mg via INTRAVENOUS
  Filled 2023-02-06: qty 200

## 2023-02-06 MED ORDER — SODIUM CHLORIDE 0.9 % IV SOLN
INTRAVENOUS | Status: DC | PRN
  Filled 2023-02-06: qty 250

## 2023-02-10 ENCOUNTER — Inpatient Hospital Stay: Payer: 59

## 2023-02-10 VITALS — BP 144/89 | HR 69 | Temp 99.2°F | Resp 18

## 2023-02-10 DIAGNOSIS — D509 Iron deficiency anemia, unspecified: Secondary | ICD-10-CM | POA: Diagnosis not present

## 2023-02-10 DIAGNOSIS — E785 Hyperlipidemia, unspecified: Secondary | ICD-10-CM | POA: Diagnosis not present

## 2023-02-10 DIAGNOSIS — Z801 Family history of malignant neoplasm of trachea, bronchus and lung: Secondary | ICD-10-CM | POA: Diagnosis not present

## 2023-02-10 DIAGNOSIS — D573 Sickle-cell trait: Secondary | ICD-10-CM | POA: Diagnosis not present

## 2023-02-10 DIAGNOSIS — K219 Gastro-esophageal reflux disease without esophagitis: Secondary | ICD-10-CM | POA: Diagnosis not present

## 2023-02-10 DIAGNOSIS — N920 Excessive and frequent menstruation with regular cycle: Secondary | ICD-10-CM | POA: Diagnosis not present

## 2023-02-10 DIAGNOSIS — D508 Other iron deficiency anemias: Secondary | ICD-10-CM

## 2023-02-10 DIAGNOSIS — Z23 Encounter for immunization: Secondary | ICD-10-CM | POA: Diagnosis not present

## 2023-02-10 DIAGNOSIS — Z79899 Other long term (current) drug therapy: Secondary | ICD-10-CM | POA: Diagnosis not present

## 2023-02-10 MED ORDER — SODIUM CHLORIDE 0.9 % IV SOLN
200.0000 mg | Freq: Once | INTRAVENOUS | Status: AC
  Administered 2023-02-10: 200 mg via INTRAVENOUS
  Filled 2023-02-10: qty 200

## 2023-02-10 MED ORDER — SODIUM CHLORIDE 0.9 % IV SOLN
Freq: Once | INTRAVENOUS | Status: AC
  Filled 2023-02-10: qty 250

## 2023-02-27 ENCOUNTER — Encounter: Payer: Self-pay | Admitting: Family Medicine

## 2023-02-27 ENCOUNTER — Telehealth (INDEPENDENT_AMBULATORY_CARE_PROVIDER_SITE_OTHER): Payer: Self-pay

## 2023-02-27 NOTE — Telephone Encounter (Signed)
Please review.  KP

## 2023-02-27 NOTE — Telephone Encounter (Signed)
I attempted to contact the patient to schedule a fibroid embolization with Dr. Gilda Crease. A message was left for a return call.

## 2023-03-08 ENCOUNTER — Telehealth (INDEPENDENT_AMBULATORY_CARE_PROVIDER_SITE_OTHER): Payer: Self-pay

## 2023-03-08 NOTE — Telephone Encounter (Signed)
 I attempted to contact the patient to schedule a fibroid embolization with Dr. Gilda Crease. A message was left for a return call.

## 2023-03-10 ENCOUNTER — Other Ambulatory Visit: Payer: Self-pay

## 2023-03-10 ENCOUNTER — Inpatient Hospital Stay: Payer: 59 | Attending: Oncology

## 2023-03-10 DIAGNOSIS — D508 Other iron deficiency anemias: Secondary | ICD-10-CM

## 2023-03-10 DIAGNOSIS — D509 Iron deficiency anemia, unspecified: Secondary | ICD-10-CM | POA: Insufficient documentation

## 2023-03-10 LAB — CBC WITH DIFFERENTIAL/PLATELET
Abs Immature Granulocytes: 0.02 10*3/uL (ref 0.00–0.07)
Basophils Absolute: 0 10*3/uL (ref 0.0–0.1)
Basophils Relative: 1 %
Eosinophils Absolute: 0.1 10*3/uL (ref 0.0–0.5)
Eosinophils Relative: 1 %
HCT: 34.5 % — ABNORMAL LOW (ref 36.0–46.0)
Hemoglobin: 11.4 g/dL — ABNORMAL LOW (ref 12.0–15.0)
Immature Granulocytes: 0 %
Lymphocytes Relative: 39 %
Lymphs Abs: 2.1 10*3/uL (ref 0.7–4.0)
MCH: 29 pg (ref 26.0–34.0)
MCHC: 33 g/dL (ref 30.0–36.0)
MCV: 87.8 fL (ref 80.0–100.0)
Monocytes Absolute: 0.3 10*3/uL (ref 0.1–1.0)
Monocytes Relative: 6 %
Neutro Abs: 2.9 10*3/uL (ref 1.7–7.7)
Neutrophils Relative %: 53 %
Platelets: 282 10*3/uL (ref 150–400)
RBC: 3.93 MIL/uL (ref 3.87–5.11)
RDW: 14 % (ref 11.5–15.5)
WBC: 5.4 10*3/uL (ref 4.0–10.5)
nRBC: 0 % (ref 0.0–0.2)

## 2023-03-15 ENCOUNTER — Telehealth (INDEPENDENT_AMBULATORY_CARE_PROVIDER_SITE_OTHER): Payer: Self-pay

## 2023-03-15 NOTE — Telephone Encounter (Signed)
I attempted to contact the patient to schedule her for a fibroid embolization with Dr. Gilda Crease. A message was left for a return call.

## 2023-04-18 ENCOUNTER — Other Ambulatory Visit: Payer: Self-pay | Admitting: *Deleted

## 2023-04-18 DIAGNOSIS — D500 Iron deficiency anemia secondary to blood loss (chronic): Secondary | ICD-10-CM

## 2023-04-21 ENCOUNTER — Inpatient Hospital Stay: Payer: 59 | Attending: Oncology

## 2023-04-21 DIAGNOSIS — Z801 Family history of malignant neoplasm of trachea, bronchus and lung: Secondary | ICD-10-CM | POA: Insufficient documentation

## 2023-04-21 DIAGNOSIS — N920 Excessive and frequent menstruation with regular cycle: Secondary | ICD-10-CM | POA: Diagnosis not present

## 2023-04-21 DIAGNOSIS — D573 Sickle-cell trait: Secondary | ICD-10-CM | POA: Insufficient documentation

## 2023-04-21 DIAGNOSIS — D509 Iron deficiency anemia, unspecified: Secondary | ICD-10-CM | POA: Diagnosis present

## 2023-04-21 DIAGNOSIS — D500 Iron deficiency anemia secondary to blood loss (chronic): Secondary | ICD-10-CM

## 2023-04-21 LAB — CBC WITH DIFFERENTIAL (CANCER CENTER ONLY)
Abs Immature Granulocytes: 0.01 10*3/uL (ref 0.00–0.07)
Basophils Absolute: 0 10*3/uL (ref 0.0–0.1)
Basophils Relative: 0 %
Eosinophils Absolute: 0.1 10*3/uL (ref 0.0–0.5)
Eosinophils Relative: 1 %
HCT: 36.6 % (ref 36.0–46.0)
Hemoglobin: 12.3 g/dL (ref 12.0–15.0)
Immature Granulocytes: 0 %
Lymphocytes Relative: 44 %
Lymphs Abs: 2.2 10*3/uL (ref 0.7–4.0)
MCH: 28.9 pg (ref 26.0–34.0)
MCHC: 33.6 g/dL (ref 30.0–36.0)
MCV: 85.9 fL (ref 80.0–100.0)
Monocytes Absolute: 0.3 10*3/uL (ref 0.1–1.0)
Monocytes Relative: 7 %
Neutro Abs: 2.4 10*3/uL (ref 1.7–7.7)
Neutrophils Relative %: 48 %
Platelet Count: 278 10*3/uL (ref 150–400)
RBC: 4.26 MIL/uL (ref 3.87–5.11)
RDW: 13.8 % (ref 11.5–15.5)
WBC Count: 5 10*3/uL (ref 4.0–10.5)
nRBC: 0 % (ref 0.0–0.2)

## 2023-04-21 LAB — IRON AND TIBC
Iron: 68 ug/dL (ref 28–170)
Saturation Ratios: 17 % (ref 10.4–31.8)
TIBC: 392 ug/dL (ref 250–450)
UIBC: 324 ug/dL

## 2023-04-21 LAB — FERRITIN: Ferritin: 38 ng/mL (ref 11–307)

## 2023-04-24 ENCOUNTER — Encounter: Payer: Self-pay | Admitting: Nurse Practitioner

## 2023-04-24 ENCOUNTER — Inpatient Hospital Stay (HOSPITAL_BASED_OUTPATIENT_CLINIC_OR_DEPARTMENT_OTHER): Payer: 59 | Admitting: Nurse Practitioner

## 2023-04-24 ENCOUNTER — Inpatient Hospital Stay: Payer: 59

## 2023-04-24 VITALS — BP 149/90 | HR 70 | Temp 98.9°F | Wt 243.0 lb

## 2023-04-24 DIAGNOSIS — D500 Iron deficiency anemia secondary to blood loss (chronic): Secondary | ICD-10-CM

## 2023-04-24 DIAGNOSIS — D509 Iron deficiency anemia, unspecified: Secondary | ICD-10-CM | POA: Diagnosis not present

## 2023-04-24 NOTE — Progress Notes (Signed)
[facility]  Telephone:(336) [phone] Fax:(336) [phone]  ID: Eileen Vargas OB: 09/29/1990  MR#: [id number]  ION#:[id number]  Patient Care Team: Jerrol Banana, MD as PCP - General (Family Medicine) Jeralyn Ruths, MD as Consulting Physician (Oncology)  CHIEF COMPLAINT: Iron deficiency anemia  INTERVAL HISTORY: Patient is a 32 year old female with history of iron deficiency anemia secondary to heavy menses who returns to clinic for follow-up. She's feeling well. Is taking vitron-c daily. Tolerating well without gi side effects. She continues to have heavy menses but waxes and wanes. Denies any neurologic complaints. Denies recent fevers or illnesses. Denies any easy bleeding or bruising. No melena or hematochezia. No pica or restless leg. Reports good appetite and denies weight loss. Denies chest pain. Denies any nausea, vomiting, constipation, or diarrhea. Denies urinary complaints. Patient offers no further specific complaints today. Planning a trip to [place].    REVIEW OF SYSTEMS:   Review of Systems  Constitutional:  Negative for chills, fever, malaise/fatigue and weight loss.  Respiratory:  Negative for cough and shortness of breath.   Cardiovascular:  Negative for chest pain, palpitations, orthopnea and claudication.  Gastrointestinal:  Negative for abdominal pain, blood in stool, constipation, diarrhea, melena, nausea and vomiting.  Genitourinary:  Negative for frequency and hematuria.       Fibroids  Musculoskeletal:  Negative for back pain, joint pain, myalgias and neck pain.  Skin: Negative.   Neurological:  Negative for dizziness, tingling, weakness and headaches.  Endo/Heme/Allergies:  Does not bruise/bleed easily.  Psychiatric/Behavioral:  Negative for depression. The patient is not nervous/anxious.   As per HPI. Otherwise, a complete review of systems is negative.  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    Patient Active Problem List   Diagnosis Date Noted   Menorrhagia 12/28/2022   Elevated fasting glucose 10/11/2022   Symptomatic anemia 08/23/2022   Need for immunization against influenza 03/29/2022   Annual physical exam 08/26/2021   Right lumbosacral radiculopathy 08/12/2021   Fibroid, uterine 08/10/2021   Hyperlipidemia 02/08/2021   Anxiety and depression 07/13/2020   Gastroesophageal reflux disease without esophagitis 07/13/2020   Fibroid 07/13/2020   Class 3 severe obesity due to excess calories with body mass index (BMI) of 45.0 to 49.9 in adult (HCC) 07/13/2020   Sickle cell trait (HCC) 07/01/2020   Iron deficiency anemia 06/06/2020   PAST SURGICAL HISTORY: Past Surgical History:  Procedure Laterality Date   CHOLECYSTECTOMY  2013   EXTRACORPOREAL SHOCK WAVE LITHOTRIPSY Left 12/01/2022   Procedure: EXTRACORPOREAL SHOCK WAVE LITHOTRIPSY (ESWL);  Surgeon: [name], MD;  Location: [facility] OR[facility];  Service: Urology;  Laterality: Left;    FAMILY HISTORY: Family History  Problem Relation Age of Onset   Diabetes Maternal Grandmother    Lung cancer Maternal Grandfather    Kidney disease Mother    Clotting disorder Father    Sickle cell trait Father    Early death Father     ADVANCED DIRECTIVES (Y/N):  N  HEALTH MAINTENANCE: Social History   Tobacco Use   Smoking status: Never    Passive exposure: Never   Smokeless tobacco: Never  Vaping Use   Vaping status: Never Used  Substance Use Topics   Alcohol use: Not Currently    Alcohol/week: 1.0 standard drink of alcohol    Types: 1 Standard drinks  or equivalent per week    Comment: Max numbers only   Drug use: Never    Colonoscopy:  PAP:  Bone density:  Lipid panel:  No Known Allergies  Current Outpatient Medications  Medication Sig Dispense Refill   citalopram (CELEXA) 20 MG tablet  TAKE 1 TABLET BY MOUTH EVERY DAY 30 tablet 5   Iron Sucrose (VENOFER IV) Inject 200 mg into the vein every 7 (seven) days.     Iron-Vitamin C (VITRON-C) 65-125 MG TABS Take 1 tablet by mouth daily. 90 tablet 1   omeprazole (PRILOSEC) 40 MG capsule Take 1 capsule (40 mg total) by mouth daily as needed. 90 capsule 0   Vitamin D, Ergocalciferol, (DRISDOL) 1.25 MG (50000 UNIT) CAPS capsule Take 1 capsule (50,000 Units total) by mouth every 7 (seven) days. Take for 8 total doses(weeks) 8 capsule 0   No current facility-administered medications for this visit.    OBJECTIVE: Vitals:   04/24/23 1515  BP: (!) 149/90  Pulse: 70  Temp: 98.9 F (37.2 C)  SpO2: 97%      Body mass index is 41.71 kg/m.    ECOG FS:0 - Asymptomatic  General: Well-developed, well-nourished, no acute distress. Lungs: No audible wheezing or coughing Abdomen: Soft, nontender, nondistended.  Musculoskeletal: No edema, cyanosis, or clubbing. Neuro: Alert, answering all questions appropriately. Cranial nerves grossly intact. Skin: No rashes or petechiae noted.  Psych: Normal affect.   LAB RESULTS: Lab Results  Component Value Date   WBC 5.0 04/21/2023   NEUTROABS 2.4 04/21/2023   HGB 12.3 04/21/2023   HCT 36.6 04/21/2023   MCV 85.9 04/21/2023   PLT 278 04/21/2023   Lab Results  Component Value Date   IRON 68 04/21/2023   TIBC 392 04/21/2023   IRONPCTSAT 17 04/21/2023   Lab Results  Component Value Date   FERRITIN 38 04/21/2023    STUDIES: No results found.  ASSESSMENT: Iron deficiency anemia.  PLAN:    1.  Iron deficiency anemia: etiology secondary to heavy menses. Last IV iron 09/26/22. Previously discussed switching from venofer to monoferric but does not appear it was approved by insurance. She will continue venofer. Hemoglobin dropped to 5.2 in April 2024. She's had close monitoring and venofer outpatient. Last received venofer 02/10/23. Today, hmg 12.3, ferritin 38, iron sat 17%. Clinically  asymptomatic. Hold venofer. Plan to recheck in 3 months or sooner if symptomatic.   2.  Sickle cell trait: No intervention needed.  She does not have any children at this time.  Likely the etiology of her mild baseline anemia.  3. Heavy menses- hx of fibroids. Previously saw Dr. Bonney Aid and discussed options for management in March 2022. S/p evaluation with Dr. Logan B[facility] at [facility]. She has declined appt with Dr Schnier/Vascular surgery for fibroid embolization. Can reconsider in future if needed.   Disposition: 3 mo- lab (cbc, bmp, ferritin, iron studies) Day to week later- see dr Orlie Dakin or me, +/- venofer- la  I spent a total of 10 minutes reviewing chart data, face-to-face evaluation with the patient, counseling and coordination of care as detailed above.  Patient expressed understanding and was in agreement with this plan. She also understands that She can call clinic at any time with any questions, concerns, or complaints.   Thank you for allowing me to participate in the care of this very pleasant patient.   Alinda Dooms, NP  04/24/2023

## 2023-05-02 ENCOUNTER — Other Ambulatory Visit: Payer: Self-pay | Admitting: Family Medicine

## 2023-05-02 DIAGNOSIS — K219 Gastro-esophageal reflux disease without esophagitis: Secondary | ICD-10-CM

## 2023-05-04 NOTE — Telephone Encounter (Signed)
 Requested by interface surescripts. Future visit in 5 months.  Requested Prescriptions  Pending Prescriptions Disp Refills   omeprazole  (PRILOSEC) 40 MG capsule [Pharmacy Med Name: OMEPRAZOLE  DR 40 MG CAPSULE] 90 capsule 0    Sig: TAKE 1 CAPSULE BY MOUTH DAILY AS NEEDED.     Gastroenterology: Proton Pump Inhibitors Passed - 05/04/2023  1:44 PM      Passed - Valid encounter within last 12 months    Recent Outpatient Visits           6 months ago Healthcare maintenance   [facility] at [facility], Selinda PARAS, MD   9 months ago Right lumbosacral radiculopathy   [place] [facility], Selinda PARAS, MD   11 months ago Right lumbosacral radiculopathy   [place] [facility] at [facility], Selinda PARAS, MD   1 year ago Right lumbosacral radiculopathy   [place] [facility] at [facility], Selinda PARAS, MD   1 year ago Annual physical exam   [facility] at [facility], Selinda PARAS, MD       Future Appointments             In 5 months Ku, Selinda PARAS, MD [facility] at [facility], [facility]

## 2023-05-27 ENCOUNTER — Other Ambulatory Visit: Payer: Self-pay | Admitting: Family Medicine

## 2023-05-27 DIAGNOSIS — M5417 Radiculopathy, lumbosacral region: Secondary | ICD-10-CM

## 2023-05-29 NOTE — Telephone Encounter (Signed)
DiscontinuedPatient PreferenceCorprew, Eileen Vargas, CPhT4/30/24 [id number]  Requested Prescriptions  Refused Prescriptions Disp Refills   diclofenac (VOLTAREN) 50 MG EC tablet [Pharmacy Med Name: DICLOFENAC SOD EC 50 MG TAB] 60 tablet 2    Sig: TAKE 1 TABLET (50 MG TOTAL) BY MOUTH 2 (TWO) TIMES DAILY. X 2 WEEKS THEN TWICE DAILY AS-NEEDED     Analgesics:  NSAIDS Failed - 05/29/2023  2:31 PM      Failed - Manual Review: Labs are only required if the patient has taken medication for more than 8 weeks.      Passed - Cr in normal range and within 360 days    Creatinine, Ser  Date Value Ref Range Status  11/14/2022 0.89 0.44 - 1.00 mg/dL Final         Passed - HGB in normal range and within 360 days    Hemoglobin  Date Value Ref Range Status  04/21/2023 12.3 12.0 - 15.0 g/dL Final  56/21/3086 57.8 (L) 11.1 - 15.9 g/dL Final         Passed - PLT in normal range and within 360 days    Platelets  Date Value Ref Range Status  08/27/2021 318 150 - 450 x10E3/uL Final   Platelet Count  Date Value Ref Range Status  04/21/2023 278 150 - 400 K/uL Final         Passed - HCT in normal range and within 360 days    HCT  Date Value Ref Range Status  04/21/2023 36.6 36.0 - 46.0 % Final   Hematocrit  Date Value Ref Range Status  08/27/2021 34.9 34.0 - 46.6 % Final         Passed - eGFR is 30 or above and within 360 days    GFR, Estimated  Date Value Ref Range Status  11/14/2022 >60 >60 mL/min Final    Comment:    (NOTE) Calculated using the CKD-EPI Creatinine Equation (2021)    eGFR  Date Value Ref Range Status  10/11/2022 88 >59 mL/min/1.73 Final         Passed - Patient is not pregnant      Passed - Valid encounter within last 12 months    Recent Outpatient Visits           7 months ago Healthcare maintenance   [facility] at [facility] Royalty, Ocie Bob, MD   10 months ago Right lumbosacral radiculopathy   [place] [facility], Ocie Bob, MD   1 year ago Right lumbosacral radiculopathy   [place] [facility] at [facility], Ocie Bob, MD   1 year ago Right lumbosacral radiculopathy   [place] [facility] at [facility], Ocie Bob, MD   1 year ago Annual physical exam   [facility] at [facility], Ocie Bob, MD       Future Appointments             In 4 months Ashley Royalty, Ocie Bob, MD [facility] at [facility]

## 2023-06-01 ENCOUNTER — Encounter: Payer: Self-pay | Admitting: Oncology

## 2023-06-01 ENCOUNTER — Telehealth (INDEPENDENT_AMBULATORY_CARE_PROVIDER_SITE_OTHER): Payer: Self-pay | Admitting: Vascular Surgery

## 2023-06-01 NOTE — Telephone Encounter (Signed)
 Patient came to office to set up surgery. Missed voicemails. Advised pt that Leita tried to call her multiple times. Verified we have her correct phone number in system. Scanned in her new insurance card- [employer]. Requested that if she doesn't answer please call her Significant Other: Mason at [phone].

## 2023-06-02 ENCOUNTER — Telehealth (INDEPENDENT_AMBULATORY_CARE_PROVIDER_SITE_OTHER): Payer: Self-pay

## 2023-06-02 ENCOUNTER — Encounter: Payer: Self-pay | Admitting: Oncology

## 2023-06-02 NOTE — Telephone Encounter (Signed)
 Spoke with the patient by calling her significant other, she is scheduled with Dr. Prescilla Brod for a fibroid embolization on 06/13/23 with a 11:00 am arrival time to the [facility]. Pre-procedure instructions were discussed and will be sent to Mychart and mailed.

## 2023-06-08 ENCOUNTER — Encounter: Payer: Self-pay | Admitting: Oncology

## 2023-06-13 ENCOUNTER — Other Ambulatory Visit: Payer: Self-pay

## 2023-06-13 ENCOUNTER — Ambulatory Visit
Admission: RE | Admit: 2023-06-13 | Discharge: 2023-06-13 | Disposition: A | Discharge status: Home / Self Care | Payer: Medicaid Other | Attending: Vascular Surgery | Admitting: Vascular Surgery

## 2023-06-13 ENCOUNTER — Encounter
Admission: RE | Disposition: A | Discharge status: Home / Self Care | Payer: Self-pay | Source: Home / Self Care | Attending: Vascular Surgery

## 2023-06-13 ENCOUNTER — Encounter: Payer: Self-pay | Admitting: Vascular Surgery

## 2023-06-13 DIAGNOSIS — K219 Gastro-esophageal reflux disease without esophagitis: Secondary | ICD-10-CM | POA: Insufficient documentation

## 2023-06-13 DIAGNOSIS — I771 Stricture of artery: Secondary | ICD-10-CM | POA: Diagnosis not present

## 2023-06-13 DIAGNOSIS — D259 Leiomyoma of uterus, unspecified: Secondary | ICD-10-CM | POA: Diagnosis not present

## 2023-06-13 DIAGNOSIS — D649 Anemia, unspecified: Secondary | ICD-10-CM | POA: Diagnosis not present

## 2023-06-13 DIAGNOSIS — N921 Excessive and frequent menstruation with irregular cycle: Secondary | ICD-10-CM | POA: Diagnosis not present

## 2023-06-13 DIAGNOSIS — Z79899 Other long term (current) drug therapy: Secondary | ICD-10-CM | POA: Insufficient documentation

## 2023-06-13 DIAGNOSIS — E782 Mixed hyperlipidemia: Secondary | ICD-10-CM | POA: Insufficient documentation

## 2023-06-13 DIAGNOSIS — M5417 Radiculopathy, lumbosacral region: Secondary | ICD-10-CM | POA: Diagnosis not present

## 2023-06-13 HISTORY — PX: EMBOLIZATION (CATH LAB): CATH118239

## 2023-06-13 LAB — CREATININE, SERUM
Creatinine, Ser: 0.88 mg/dL (ref 0.44–1.00)
GFR, Estimated: >60 mL/min (ref >60)

## 2023-06-13 LAB — BUN: BUN: 8 mg/dL (ref 6–20)

## 2023-06-13 SURGERY — EMBOLIZATION
Anesthesia: Moderate Sedation

## 2023-06-13 MED ORDER — HYDROMORPHONE HCL 1 MG/ML IJ SOLN
INTRAMUSCULAR | Status: AC
  Filled 2023-06-13: qty 0.5

## 2023-06-13 MED ORDER — CEFAZOLIN SODIUM-DEXTROSE 2-4 GM/100ML-% IV SOLN
INTRAVENOUS | Status: AC
  Filled 2023-06-13: qty 100

## 2023-06-13 MED ORDER — ONDANSETRON HCL 4 MG/2ML IJ SOLN
INTRAMUSCULAR | Status: AC
  Filled 2023-06-13: qty 2

## 2023-06-13 MED ORDER — FENTANYL CITRATE (PF) 100 MCG/2ML IJ SOLN
INTRAMUSCULAR | Status: AC
  Filled 2023-06-13: qty 2

## 2023-06-13 MED ORDER — SODIUM CHLORIDE 0.9 % IV SOLN: INTRAVENOUS | Status: DC

## 2023-06-13 MED ORDER — MIDAZOLAM HCL 2 MG/ML PO SYRP: 8.0000 mg | ORAL_SOLUTION | Freq: Once | ORAL | Status: DC | PRN

## 2023-06-13 MED ORDER — CEFAZOLIN SODIUM-DEXTROSE 2-4 GM/100ML-% IV SOLN
2.0000 g | INTRAVENOUS | Status: AC
  Administered 2023-06-13: 2 g via INTRAVENOUS

## 2023-06-13 MED ORDER — IODIXANOL 320 MG/ML IV SOLN
INTRAVENOUS | Status: DC | PRN
  Administered 2023-06-13: 40 mL

## 2023-06-13 MED ORDER — MIDAZOLAM HCL 5 MG/5ML IJ SOLN
INTRAMUSCULAR | Status: AC
  Filled 2023-06-13: qty 5

## 2023-06-13 MED ORDER — DIPHENHYDRAMINE HCL 50 MG/ML IJ SOLN: 50.0000 mg | Freq: Once | INTRAMUSCULAR | Status: DC | PRN

## 2023-06-13 MED ORDER — METHYLPREDNISOLONE SODIUM SUCC 125 MG IJ SOLR: 125.0000 mg | Freq: Once | INTRAMUSCULAR | Status: DC | PRN

## 2023-06-13 MED ORDER — LIDOCAINE HCL (PF) 1 % IJ SOLN
INTRAMUSCULAR | Status: DC | PRN
  Administered 2023-06-13: 10 mL

## 2023-06-13 MED ORDER — OXYCODONE-ACETAMINOPHEN 5-325 MG PO TABS: 1.0000 | ORAL_TABLET | Freq: Four times a day (QID) | ORAL | 0 refills | Status: DC | PRN

## 2023-06-13 MED ORDER — FENTANYL CITRATE (PF) 100 MCG/2ML IJ SOLN
INTRAMUSCULAR | Status: DC | PRN
  Administered 2023-06-13 (×3): 25 ug via INTRAVENOUS
  Administered 2023-06-13: 50 ug via INTRAVENOUS

## 2023-06-13 MED ORDER — ONDANSETRON HCL 4 MG PO TABS: 4.0000 mg | ORAL_TABLET | Freq: Four times a day (QID) | ORAL | 1 refills | Status: AC | PRN

## 2023-06-13 MED ORDER — HYDROMORPHONE HCL 1 MG/ML IJ SOLN
1.0000 mg | Freq: Once | INTRAMUSCULAR | Status: AC | PRN
  Administered 2023-06-13: 1 mg via INTRAVENOUS

## 2023-06-13 MED ORDER — ONDANSETRON HCL 4 MG/2ML IJ SOLN
4.0000 mg | Freq: Four times a day (QID) | INTRAMUSCULAR | Status: DC | PRN
  Administered 2023-06-13: 4 mg via INTRAVENOUS

## 2023-06-13 MED ORDER — FAMOTIDINE 20 MG PO TABS: 40.0000 mg | ORAL_TABLET | Freq: Once | ORAL | Status: DC | PRN

## 2023-06-13 MED ORDER — HEPARIN (PORCINE) IN NACL 1000-0.9 UT/500ML-% IV SOLN
INTRAVENOUS | Status: DC | PRN
  Administered 2023-06-13: 1000 mL

## 2023-06-13 MED ORDER — HEPARIN SODIUM (PORCINE) 1000 UNIT/ML IJ SOLN
INTRAMUSCULAR | Status: AC
Start: 2023-06-13 — End: ?
  Filled 2023-06-13: qty 10

## 2023-06-13 MED ORDER — HYDROMORPHONE HCL 1 MG/ML IJ SOLN
INTRAMUSCULAR | Status: AC
  Filled 2023-06-13: qty 1

## 2023-06-13 MED ORDER — OXYCODONE-ACETAMINOPHEN 5-325 MG PO TABS
1.0000 | ORAL_TABLET | ORAL | Status: DC | PRN
  Administered 2023-06-13: 1 via ORAL

## 2023-06-13 MED ORDER — OXYCODONE-ACETAMINOPHEN 5-325 MG PO TABS
ORAL_TABLET | ORAL | Status: AC
  Filled 2023-06-13: qty 1

## 2023-06-13 MED ORDER — HYDROMORPHONE HCL 1 MG/ML IJ SOLN
0.5000 mg | INTRAMUSCULAR | Status: DC | PRN
  Administered 2023-06-13: 0.5 mg via INTRAVENOUS

## 2023-06-13 MED ORDER — HEPARIN SODIUM (PORCINE) 1000 UNIT/ML IJ SOLN
INTRAMUSCULAR | Status: DC | PRN
  Administered 2023-06-13: 3000 [IU] via INTRAVENOUS

## 2023-06-13 MED ORDER — MIDAZOLAM HCL 2 MG/2ML IJ SOLN
INTRAMUSCULAR | Status: DC | PRN
  Administered 2023-06-13: .5 mg via INTRAVENOUS
  Administered 2023-06-13: 1 mg via INTRAVENOUS
  Administered 2023-06-13: .5 mg via INTRAVENOUS
  Administered 2023-06-13: 1 mg via INTRAVENOUS

## 2023-06-13 SURGICAL SUPPLY — 21 items
CATH ANGIO 5F PIGTAIL 65CM (CATHETERS) IMPLANT
CATH BEACON 5 .035 65 KMP TIP (CATHETERS) IMPLANT
CATH MICROCATH PRGRT 2.8F 110 (CATHETERS) IMPLANT
CATH TEMPO 5F RIM 65CM (CATHETERS) IMPLANT
COVER PROBE ULTRASOUND 5X96 (MISCELLANEOUS) IMPLANT
DEVICE STARCLOSE SE CLOSURE (Vascular Products) IMPLANT
DEVICE TORQUE (MISCELLANEOUS) IMPLANT
GUIDEWIRE ANGLED .035 180CM (WIRE) IMPLANT
MICROCATH PROGREAT 2.8F 110 CM (CATHETERS) ×1 IMPLANT
MICROSPHERE 600+75UM (Embolic) IMPLANT
MICROSPHERE 800+75UM (Embolic) IMPLANT
NDL ENTRY 21GA 7CM ECHOTIP (NEEDLE) IMPLANT
NEEDLE ENTRY 21GA 7CM ECHOTIP (NEEDLE) ×1 IMPLANT
PACK ANGIOGRAPHY (CUSTOM PROCEDURE TRAY) ×1 IMPLANT
PANNUS RETENTION SYSTEM 2 PAD (MISCELLANEOUS) IMPLANT
SET INTRO CAPELLA COAXIAL (SET/KITS/TRAYS/PACK) IMPLANT
SHEATH BRITE TIP 5FRX11 (SHEATH) IMPLANT
STOPCOCK 3WAY MALE LL (IV SETS) IMPLANT
SYR MEDRAD MARK 7 150ML (SYRINGE) IMPLANT
TUBING CONTRAST HIGH PRESS 72 (TUBING) IMPLANT
WIRE GUIDERIGHT .035X150 (WIRE) IMPLANT

## 2023-06-13 NOTE — Interval H&P Note (Signed)
 History and Physical Interval Note:  06/13/2023 11:54 AM  Eileen Vargas  has presented today for surgery, with the diagnosis of Fibroid Embolization    Menometrorrhagia.  The various methods of treatment have been discussed with the patient and family. After consideration of risks, benefits and other options for treatment, the patient has consented to  Procedure(s): EMBOLIZATION (N/A) as a surgical intervention.  The patient's history has been reviewed, patient examined, no change in status, stable for surgery.  I have reviewed the patient's chart and labs.  Questions were answered to the patient's satisfaction.     Levora Dredge

## 2023-06-13 NOTE — Progress Notes (Signed)
 MRN : [id number]  Eileen Vargas is a 33 y.o. ([birth year]) female who presents with chief complaint of check circulation.  History of Present Illness:   Patient presents to [facility] for uterine fibroid embolization.  She was last seen in the office in September 2024.  She is a 33 year old woman with heavy menstrual cycles and increasing cramping pain associated with anemia.  She was seen by Dr. Logan Bores in May 2024 at which time various methods for treatment of her menorrhagia and pelvic pain were discussed.  The patient consider these treatment options which included hormonal control, UFE, endometrial ablation and surgical intervention including hysterectomy.  At this point she presents today for discussion regarding UFE.   At the time of my initial assessment heavy menstrual bleeding difficulties with anemia she is required transfusions as well as iron therapy.  Based on updating the history today she has continued to have heavy bleeding.  She has not required transfusions since September but she continues iron therapy.  Current Meds  Medication Sig   citalopram (CELEXA) 20 MG tablet TAKE 1 TABLET BY MOUTH EVERY DAY   Iron-Vitamin C (VITRON-C) 65-125 MG TABS Take 1 tablet by mouth daily.   omeprazole (PRILOSEC) 40 MG capsule TAKE 1 CAPSULE BY MOUTH DAILY AS NEEDED.   Vitamin D, Ergocalciferol, (DRISDOL) 1.25 MG (50000 UNIT) CAPS capsule Take 1 capsule (50,000 Units total) by mouth every 7 (seven) days. 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    Past Surgical History:  Procedure Laterality Date   CHOLECYSTECTOMY  2013   EXTRACORPOREAL SHOCK WAVE LITHOTRIPSY Left 12/01/2022   Procedure: EXTRACORPOREAL SHOCK WAVE LITHOTRIPSY (ESWL);   Surgeon: [name], MD;  Location: [facility] ORS;  Service: Urology;  Laterality: Left;    Social History Social History   Tobacco Use   Smoking status: Never    Passive exposure: Never   Smokeless tobacco: Never  Vaping Use   Vaping status: Never Used  Substance Use Topics   Alcohol use: Yes    Alcohol/week: 1.0 standard drink of alcohol    Types: 1 Standard drinks or equivalent per week    Comment: Max numbers only   Drug use: Never    Family History Family History  Problem Relation Age of Onset   Diabetes Maternal Grandmother    Lung cancer Maternal Grandfather    Kidney disease Mother    Clotting disorder Father    Sickle cell trait Father    Early death Father     No Known Allergies   REVIEW OF SYSTEMS (Negative unless checked)  Constitutional: [] Weight loss  [] Fever  [] Chills Cardiac: [] Chest pain   [] Chest pressure   [] Palpitations   [] Shortness of breath when laying flat   [] Shortness of breath with exertion. Vascular:  [x] Pain in legs with walking   [] Pain in legs at rest  [] History of DVT   [] Phlebitis   []   Swelling in legs   [] Varicose veins   [] Non-healing ulcers Pulmonary:   [] Uses home oxygen   [] Productive cough   [] Hemoptysis   [] Wheeze  [] COPD   [] Asthma Neurologic:  [] Dizziness   [] Seizures   [] History of stroke   [] History of TIA  [] Aphasia   [] Vissual changes   [] Weakness or numbness in arm   [] Weakness or numbness in leg Musculoskeletal:   [] Joint swelling   [] Joint pain   [] Low back pain Hematologic:  [] Easy bruising  [] Easy bleeding   [] Hypercoagulable state   [] Anemic Gastrointestinal:  [] Diarrhea   [] Vomiting  [] Gastroesophageal reflux/heartburn   [] Difficulty swallowing. Genitourinary:  [] Chronic kidney disease   [] Difficult urination  [] Frequent urination   [] Blood in urine Skin:  [] Rashes   [] Ulcers  Psychological:  [] History of anxiety   []  History of major depression.  Physical Examination  Vitals:   06/13/23 1136  BP: 130/89  Pulse:  64  Resp: (!) 21  Temp: 97.7 F (36.5 C)  TempSrc: Oral  SpO2: 98%  Weight: 112.5 kg  Height: 5\' 4"  (1.626 m)   Body mass index is 42.57 kg/m. Gen: WD/WN, NAD Head: [place]/AT, No temporalis wasting.  Ear/Nose/Throat: Hearing grossly intact, nares w/o erythema or drainage Eyes: PER, EOMI, sclera nonicteric.  Neck: Supple, no masses.  No bruit or JVD.  Pulmonary:  Good air movement, no audible wheezing, no use of accessory muscles.  Cardiac: RRR, normal S1, S2, no Murmurs. Vascular:   Vessel Right Left  Radial Palpable Palpable  Gastrointestinal: soft, non-distended. No guarding/no peritoneal signs.  Musculoskeletal: M/S 5/5 throughout.  No visible deformity.  Neurologic: CN 2-12 intact. Pain and light touch intact in extremities.  Symmetrical.  Speech is fluent. Motor exam as listed above. Psychiatric: Judgment intact, Mood & affect appropriate for pt's clinical situation. Dermatologic: No rashes or ulcers noted.  No changes consistent with cellulitis.   CBC Lab Results  Component Value Date   WBC 5.0 04/21/2023   HGB 12.3 04/21/2023   HCT 36.6 04/21/2023   MCV 85.9 04/21/2023   PLT 278 04/21/2023    BMET    Component Value Date/Time   NA 135 11/14/2022 0251   NA 139 10/11/2022 1109   K 3.5 11/14/2022 0251   CL 106 11/14/2022 0251   CO2 22 11/14/2022 0251   GLUCOSE 137 (H) 11/14/2022 0251   BUN 9 11/14/2022 0251   BUN 10 10/11/2022 1109   CREATININE 0.89 11/14/2022 0251   CALCIUM 8.6 (L) 11/14/2022 0251   GFRNONAA >60 11/14/2022 0251   CrCl cannot be calculated (Patient's most recent lab result is older than the maximum 21 days allowed.).  COAG No results found for: "INR", "PROTIME"  Radiology No results found.   Assessment/Plan 1. Excessive bleeding in premenopausal period The patient is having excessive uterine bleeding which is resulted in anemia and blood transfusions in the past.  This is an ongoing problem.  She has discussed this with her OB/GYN as  well as with myself.  Of the options available including medical treatment, embolization of the fibroids and surgery including hysterectomy she has decided to proceed with embolization.   The risks and benefits have been reviewed again with the patient today when she arrived to the hospital.  The discussion focused on the techniques of the procedure and the pre and postoperative care as well as potential complications including but not exclusive to bleeding, dye reactions, continued menorrhagia, be disbursement outside the uterine artery and possible future pregnancy.  All  questions have been answered and she wishes to move forward as she has continued to have problems with bleeding.     2. Uterine leiomyoma, unspecified location The patient is having excessive uterine bleeding which is resulted in anemia and blood transfusions in the past.  This is an ongoing problem.  She has discussed this with her OB/GYN as well as with myself.  Of the options available including medical treatment, embolization of the fibroids and surgery including hysterectomy she has decided to proceed with embolization.   The risks and benefits have been reviewed again with the patient today when she arrived to the hospital.  The discussion focused on the techniques of the procedure and the pre and postoperative care as well as potential complications including but not exclusive to bleeding, dye reactions, continued menorrhagia, be disbursement outside the uterine artery and possible future pregnancy.  All questions have been answered and she wishes to move forward as she has continued to have problems with bleeding.     3. Gastroesophageal reflux disease without esophagitis Continue PPI as already ordered, this medication has been reviewed and there are no changes at this time.   Avoidence of caffeine and alcohol   Moderate elevation of the head of the bed    4. Right lumbosacral radiculopathy Continue NSAID medications as  already ordered, these medications have been reviewed and there are no changes at this time.   Continued activity and therapy was stressed.   5. Mixed hyperlipidemia Continue statin as ordered and reviewed, no changes at this time   Levora Dredge, MD  06/13/2023 11:47 AM

## 2023-06-13 NOTE — Op Note (Signed)
 [facility]  Percutaneous Study/Intervention Procedural Note   Date of Surgery: 06/13/2023,1:50 PM  Surgeon:Deisi Salonga, Latina Craver   Pre-operative Diagnosis: Menometrorrhagia secondary to uterine fibroids; bleeding uterine fibroids resulting in anemia of blood loss requiring iron supplementation and past history of transfusion  Post-operative diagnosis:  Same  Procedure(s) Performed:  1.  Pelvic angiogram  2.  Right uterine artery catheter placement third order  3.  Left uterine artery catheter placement third order  4.  Embolization of uterine fibroids bilateral uterine artery approach  5.  Ultrasound-guided access to the right common femoral artery  6.  StarClose closure right common femoral artery    Anesthesia: Conscious sedation was administered by the interventional radiology RN under my direct supervision. IV Versed plus fentanyl were utilized. Continuous ECG, pulse oximetry and blood pressure was monitored throughout the entire procedure. Conscious sedation was administered for a total of 1 hour 3 minutes and 29 seconds.  Sheath: 5 French 11 cm Pinnacle retrograde right common femoral  Contrast: 40 cc   Fluoroscopy Time: 9.1 minutes  Indications: Patient has had persistent bleeding from her uterine fibroids and to the point that she is required transfusions in the past and continues on iron therapy.  Procedure:  Eileen Pringleis a 33 y.o. female who was identified and appropriate procedural time out was performed.  The patient was then placed supine on the table and prepped and draped in the usual sterile fashion.  Ultrasound was used to evaluate the right common femoral artery.  It was echolucent and pulsatile indicating it is patent .  An ultrasound image was acquired for the permanent record.  A micropuncture needle was used to access the right common femoral artery under direct ultrasound guidance.  The microwire was then advanced under fluoroscopic  guidance without difficulty followed by the micro-sheath.  A 0.035 J wire was advanced without resistance and a 5Fr sheath was placed.    Pigtail catheter is advanced up a J-wire and positioned in the distal aorta and AP projection of the aortic bifurcation and iliac arteries is obtained.  Floppy Glidewire is then advanced through the pigtail and the pigtail catheter is exchanged for a rim catheter.  I positioned the detector in the LAO projection.  Using the Glidewire and the rim catheter I selected the right internal iliac artery.  Glidewire was removed and a prograde catheter that was then advanced.  Several images through the prograde catheter were then utilized to demonstrate the origin of the right uterine artery which was then selected with the prograde catheter and wire.  Hand-injection of contrast verified catheter positioning within the mid to distal right uterine artery.  I began with 2 cc of the medium sized PVC beads 500 to 700 m in size.  Follow-up imaging demonstrated a significant decrease in flow.  I then added 3 cc of the large sized PVC beads 700 to 900 m in size.  Again, follow-up imaging demonstrated further reduction in flow but the main right uterine artery remained patent slight amount of reflux of contrast is now noted on injection through the prograde with a marked decrease in the distal vasculature of the uterine artery.  The uterine fibroid itself no longer exhibits the original blush.  Given this excellent result I elected to move forward with treatment of the left side.  Glidewire was then reintroduced into the rim catheter and the rim catheter was negotiated up into the distal aorta where the rim catheter was then used to select  the left common iliac.  Hand-injection of contrast in the RAO projection through the rim catheter clearly demonstrated the internal iliac on the left.  I then selected this with the prograde catheter and wire combination.  Several images through the  prograde catheter were then utilized to demonstrate the origin of the left uterine artery which was then selected with the prograde catheter and wire.  Hand-injection of contrast verified catheter positioning within the mid to distal left uterine artery.  I began with 1 cc of the medium sized PVC beads 500 to 700 m in size.  Follow-up imaging demonstrated a significant decrease in flow.  I then added 2 cc of the large sized PVC beads 700 to 900 m in size.  Again, follow-up imaging demonstrated further reduction in flow but the main left uterine artery remained patent slight amount of reflux of contrast is now noted on injection through the prograde with a marked decrease in the distal vasculature of the uterine artery.  The uterine fibroid itself no longer exhibits the original blush.    Findings:  Initial imaging from the distal aorta demonstrated normal aortic bifurcation with normal common internal and external iliac artery anatomy.  Even on the initial AP view very large tortuous uterine arteries are identified bilaterally.  Selective injection of the right uterine artery demonstrates the enlarged uterus in association with the blushes from the fibroids.  Following embolization as described above there is now a marked reduction in distal flow demonstrating successful embolization.  Selective injection of the left uterine artery demonstrates the enlarged uterus in association with the blushes from the fibroids.  Following embolization as described above there is now a marked reduction in distal flow demonstrating successful embolization.  Successful bilateral uterine artery embolization    Disposition: Patient was taken to the recovery room in stable condition having tolerated the procedure well.  Eileen Vargas 06/13/2023,1:50 PM

## 2023-06-13 NOTE — H&P (View-Only) (Signed)
 MRN : [id number]  Eileen Vargas is a 33 y.o. ([birth year]) female who presents with chief complaint of check circulation.  History of Present Illness:   Patient presents to [facility] for uterine fibroid embolization.  She was last seen in the office in September 2024.  She is a 33 year old woman with heavy menstrual cycles and increasing cramping pain associated with anemia.  She was seen by Dr. Logan Bores in May 2024 at which time various methods for treatment of her menorrhagia and pelvic pain were discussed.  The patient consider these treatment options which included hormonal control, UFE, endometrial ablation and surgical intervention including hysterectomy.  At this point she presents today for discussion regarding UFE.   At the time of my initial assessment heavy menstrual bleeding difficulties with anemia she is required transfusions as well as iron therapy.  Based on updating the history today she has continued to have heavy bleeding.  She has not required transfusions since September but she continues iron therapy.  Current Meds  Medication Sig   citalopram (CELEXA) 20 MG tablet TAKE 1 TABLET BY MOUTH EVERY DAY   Iron-Vitamin C (VITRON-C) 65-125 MG TABS Take 1 tablet by mouth daily.   omeprazole (PRILOSEC) 40 MG capsule TAKE 1 CAPSULE BY MOUTH DAILY AS NEEDED.   Vitamin D, Ergocalciferol, (DRISDOL) 1.25 MG (50000 UNIT) CAPS capsule Take 1 capsule (50,000 Units total) by mouth every 7 (seven) days. 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    Past Surgical History:  Procedure Laterality Date   CHOLECYSTECTOMY  2013   EXTRACORPOREAL SHOCK WAVE LITHOTRIPSY Left 12/01/2022   Procedure: EXTRACORPOREAL SHOCK WAVE LITHOTRIPSY (ESWL);   Surgeon: [name], MD;  Location: [facility];  Service: Urology;  Laterality: Left;    Social History Social History   Tobacco Use   Smoking status: Never    Passive exposure: Never   Smokeless tobacco: Never  Vaping Use   Vaping status: Never Used  Substance Use Topics   Alcohol use: Yes    Alcohol/week: 1.0 standard drink of alcohol    Types: 1 Standard drinks or equivalent per week    Comment: Max numbers only   Drug use: Never    Family History Family History  Problem Relation Age of Onset   Diabetes Maternal Grandmother    Lung cancer Maternal Grandfather    Kidney disease Mother    Clotting disorder Father    Sickle cell trait Father    Early death Father     No Known Allergies   REVIEW OF SYSTEMS (Negative unless checked)  Constitutional: [] Weight loss  [] Fever  [] Chills Cardiac: [] Chest pain   [] Chest pressure   [] Palpitations   [] Shortness of breath when laying flat   [] Shortness of breath with exertion. Vascular:  [x] Pain in legs with walking   [] Pain in legs at rest  [] History of DVT   [] Phlebitis   []   Swelling in legs   [] Varicose veins   [] Non-healing ulcers Pulmonary:   [] Uses home oxygen   [] Productive cough   [] Hemoptysis   [] Wheeze  [] COPD   [] Asthma Neurologic:  [] Dizziness   [] Seizures   [] History of stroke   [] History of TIA  [] Aphasia   [] Vissual changes   [] Weakness or numbness in arm   [] Weakness or numbness in leg Musculoskeletal:   [] Joint swelling   [] Joint pain   [] Low back pain Hematologic:  [] Easy bruising  [] Easy bleeding   [] Hypercoagulable state   [] Anemic Gastrointestinal:  [] Diarrhea   [] Vomiting  [] Gastroesophageal reflux/heartburn   [] Difficulty swallowing. Genitourinary:  [] Chronic kidney disease   [] Difficult urination  [] Frequent urination   [] Blood in urine Skin:  [] Rashes   [] Ulcers  Psychological:  [] History of anxiety   []  History of major depression.  Physical Examination  Vitals:   06/13/23 1136  BP: 130/89  Pulse:  64  Resp: (!) 21  Temp: 97.7 F (36.5 C)  TempSrc: Oral  SpO2: 98%  Weight: 112.5 kg  Height: 5\' 4"  (1.626 m)   Body mass index is 42.57 kg/m. Gen: WD/WN, NAD Head: [place]/AT, No temporalis wasting.  Ear/Nose/Throat: Hearing grossly intact, nares w/o erythema or drainage Eyes: PER, EOMI, sclera nonicteric.  Neck: Supple, no masses.  No bruit or JVD.  Pulmonary:  Good air movement, no audible wheezing, no use of accessory muscles.  Cardiac: RRR, normal S1, S2, no Murmurs. Vascular:   Vessel Right Left  Radial Palpable Palpable  Gastrointestinal: soft, non-distended. No guarding/no peritoneal signs.  Musculoskeletal: M/S 5/5 throughout.  No visible deformity.  Neurologic: CN 2-12 intact. Pain and light touch intact in extremities.  Symmetrical.  Speech is fluent. Motor exam as listed above. Psychiatric: Judgment intact, Mood & affect appropriate for pt's clinical situation. Dermatologic: No rashes or ulcers noted.  No changes consistent with cellulitis.   CBC Lab Results  Component Value Date   WBC 5.0 04/21/2023   HGB 12.3 04/21/2023   HCT 36.6 04/21/2023   MCV 85.9 04/21/2023   PLT 278 04/21/2023    BMET    Component Value Date/Time   NA 135 11/14/2022 0251   NA 139 10/11/2022 1109   K 3.5 11/14/2022 0251   CL 106 11/14/2022 0251   CO2 22 11/14/2022 0251   GLUCOSE 137 (H) 11/14/2022 0251   BUN 9 11/14/2022 0251   BUN 10 10/11/2022 1109   CREATININE 0.89 11/14/2022 0251   CALCIUM 8.6 (L) 11/14/2022 0251   GFRNONAA >60 11/14/2022 0251   CrCl cannot be calculated (Patient's most recent lab result is older than the maximum 21 days allowed.).  COAG No results found for: "INR", "PROTIME"  Radiology No results found.   Assessment/Plan 1. Excessive bleeding in premenopausal period The patient is having excessive uterine bleeding which is resulted in anemia and blood transfusions in the past.  This is an ongoing problem.  She has discussed this with her OB/GYN as  well as with myself.  Of the options available including medical treatment, embolization of the fibroids and surgery including hysterectomy she has decided to proceed with embolization.   The risks and benefits have been reviewed again with the patient today when she arrived to the hospital.  The discussion focused on the techniques of the procedure and the pre and postoperative care as well as potential complications including but not exclusive to bleeding, dye reactions, continued menorrhagia, be disbursement outside the uterine artery and possible future pregnancy.  All  questions have been answered and she wishes to move forward as she has continued to have problems with bleeding.     2. Uterine leiomyoma, unspecified location The patient is having excessive uterine bleeding which is resulted in anemia and blood transfusions in the past.  This is an ongoing problem.  She has discussed this with her OB/GYN as well as with myself.  Of the options available including medical treatment, embolization of the fibroids and surgery including hysterectomy she has decided to proceed with embolization.   The risks and benefits have been reviewed again with the patient today when she arrived to the hospital.  The discussion focused on the techniques of the procedure and the pre and postoperative care as well as potential complications including but not exclusive to bleeding, dye reactions, continued menorrhagia, be disbursement outside the uterine artery and possible future pregnancy.  All questions have been answered and she wishes to move forward as she has continued to have problems with bleeding.     3. Gastroesophageal reflux disease without esophagitis Continue PPI as already ordered, this medication has been reviewed and there are no changes at this time.   Avoidence of caffeine and alcohol   Moderate elevation of the head of the bed    4. Right lumbosacral radiculopathy Continue NSAID medications as  already ordered, these medications have been reviewed and there are no changes at this time.   Continued activity and therapy was stressed.   5. Mixed hyperlipidemia Continue statin as ordered and reviewed, no changes at this time   Levora Dredge, MD  06/13/2023 11:47 AM

## 2023-06-13 NOTE — Interval H&P Note (Signed)
 History and Physical Interval Note:  06/13/2023 11:53 AM  Eileen Vargas  has presented today for surgery, with the diagnosis of Fibroid Embolization    Menometrorrhagia.  The various methods of treatment have been discussed with the patient and family. After consideration of risks, benefits and other options for treatment, the patient has consented to  Procedure(s): EMBOLIZATION (N/A) as a surgical intervention.  The patient's history has been reviewed, patient examined, no change in status, stable for surgery.  I have reviewed the patient's chart and labs.  Questions were answered to the patient's satisfaction.     Levora Dredge

## 2023-06-14 ENCOUNTER — Encounter: Payer: Self-pay | Admitting: Vascular Surgery

## 2023-06-19 ENCOUNTER — Encounter: Payer: Self-pay | Admitting: Oncology

## 2023-06-19 ENCOUNTER — Encounter (INDEPENDENT_AMBULATORY_CARE_PROVIDER_SITE_OTHER): Payer: Self-pay | Admitting: Vascular Surgery

## 2023-06-20 ENCOUNTER — Encounter (INDEPENDENT_AMBULATORY_CARE_PROVIDER_SITE_OTHER): Payer: Self-pay | Admitting: Nurse Practitioner

## 2023-06-20 ENCOUNTER — Other Ambulatory Visit: Payer: Self-pay | Admitting: Family Medicine

## 2023-06-20 DIAGNOSIS — F419 Anxiety disorder, unspecified: Secondary | ICD-10-CM

## 2023-06-20 NOTE — Telephone Encounter (Signed)
 I sent a letter to patient's my Chart

## 2023-06-20 NOTE — Telephone Encounter (Signed)
 Left VM

## 2023-06-21 NOTE — Telephone Encounter (Signed)
 Requested medication (s) are due for refill today: Yes  Requested medication (s) are on the active medication list: Yes  Last refill:  12/28/22,#30, 5 refills  Future visit scheduled: Yes  Notes to clinic:  Unable to refill per protocol, appointment needed.      Requested Prescriptions  Pending Prescriptions Disp Refills   citalopram (CELEXA) 20 MG tablet [Pharmacy Med Name: CITALOPRAM HBR 20 MG TABLET] 30 tablet 5    Sig: TAKE 1 TABLET BY MOUTH EVERY DAY     Psychiatry:  Antidepressants - SSRI Failed - 06/21/2023  8:49 AM      Failed - Completed PHQ-2 or PHQ-9 in the last 360 days      Failed - Valid encounter within last 6 months    Recent Outpatient Visits           8 months ago Healthcare maintenance   [facility] at [facility], Ocie Bob, MD   11 months ago Right lumbosacral radiculopathy   [place] [facility] at [facility], Ocie Bob, MD   1 year ago Right lumbosacral radiculopathy   [place] [facility] at [facility], Ocie Bob, MD   1 year ago Right lumbosacral radiculopathy   [place] [facility] at [facility], Ocie Bob, MD   1 year ago Annual physical exam   [facility] at [facility] [place], Ocie Bob, MD       Future Appointments             In 3 months Ashley Royalty, Ocie Bob, MD [facility] at [facility], [facility]

## 2023-06-25 NOTE — Progress Notes (Unsigned)
 MRN : [id number]  Eileen Vargas is a 33 y.o. (April 11, 1991) female who presents with chief complaint of check circulation.  History of Present Illness:   Patient is status post uterine fibroid embolization on June 13, 2023.  Procedure was performed for:  Menometrorrhagia secondary to uterine fibroids; bleeding uterine fibroids resulting in anemia of blood loss requiring iron supplementation and past history of transfusion.  She notes that within a few days of her procedure she did have a menstrual cycle.  Bleeding was about the same.  She has not had significant bleeding since that menstrual period ended.  Her cramping has completely resolved.  Overall she feels she is getting stronger and doing well.   No outpatient medications have been marked as taking for the 06/26/23 encounter (Appointment) with Gilda Crease, Latina Craver, MD.    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    Past Surgical History:  Procedure Laterality Date   CHOLECYSTECTOMY  2013   EMBOLIZATION (CATH LAB) N/A 06/13/2023   Procedure: EMBOLIZATION;  Surgeon: Renford Dills, MD;  Location: [facility];  Service: Cardiovascular;  Laterality: N/A;   EXTRACORPOREAL SHOCK WAVE LITHOTRIPSY Left 12/01/2022   Procedure: EXTRACORPOREAL SHOCK WAVE LITHOTRIPSY (ESWL);  Surgeon: [name], MD;  Location: [facility];  Service: Urology;  Laterality: Left;    Social History Social History   Tobacco Use   Smoking status: Never    Passive exposure: Never   Smokeless tobacco: Never  Vaping Use   Vaping status: Never Used  Substance Use Topics   Alcohol use: Yes    Alcohol/week: 1.0 standard drink of alcohol    Types: 1 Standard drinks or equivalent per week    Comment: Max numbers only   Drug use: Never     Family History Family History  Problem Relation Age of Onset   Diabetes Maternal Grandmother    Lung cancer Maternal Grandfather    Kidney disease Mother    Clotting disorder Father    Sickle cell trait Father    Early death Father     No Known Allergies   REVIEW OF SYSTEMS (Negative unless checked)  Constitutional: [] Weight loss  [] Fever  [] Chills Cardiac: [] Chest pain   [] Chest pressure   [] Palpitations   [] Shortness of breath when laying flat   [] Shortness of breath with exertion. Vascular:  [x] Pain in legs with walking   [] Pain in legs at rest  [] History of DVT   [] Phlebitis   [] Swelling in legs   [] Varicose veins   [] Non-healing ulcers Pulmonary:   [] Uses home oxygen   [] Productive cough   [] Hemoptysis   [] Wheeze  [] COPD   [] Asthma Neurologic:  [] Dizziness   [] Seizures   [] History of stroke   [] History of TIA  [] Aphasia   [] Vissual changes   [] Weakness or numbness in arm   [] Weakness or numbness in leg Musculoskeletal:   [] Joint swelling   [] Joint pain   [] Low back pain  Hematologic:  [] Easy bruising  [] Easy bleeding   [] Hypercoagulable state   [] Anemic Gastrointestinal:  [] Diarrhea   [] Vomiting  [] Gastroesophageal reflux/heartburn   [] Difficulty swallowing. Genitourinary:  [] Chronic kidney disease   [] Difficult urination  [] Frequent urination   [] Blood in urine Skin:  [] Rashes   [] Ulcers  Psychological:  [] History of anxiety   []  History of major depression.  Physical Examination  There were no vitals filed for this visit. There is no height or weight on file to calculate BMI. Gen: WD/WN, NAD Head: [place]/AT, No temporalis wasting.  Ear/Nose/Throat: Hearing grossly intact, nares w/o erythema or drainage Eyes: PER, EOMI, sclera nonicteric.  Neck: Supple, no masses.  No bruit or JVD.  Pulmonary:  Good air movement, no audible wheezing, no use of accessory muscles.  Cardiac: RRR, normal S1, S2, no Murmurs. Vascular:   Vessel Right Left  Radial Palpable Palpable   Gastrointestinal: soft, non-distended. No guarding/no peritoneal signs.  Musculoskeletal: M/S 5/5 throughout.  No visible deformity.  Neurologic: CN 2-12 intact. Pain and light touch intact in extremities.  Symmetrical.  Speech is fluent. Motor exam as listed above. Psychiatric: Judgment intact, Mood & affect appropriate for pt's clinical situation. Dermatologic: No rashes or ulcers noted.  No changes consistent with cellulitis.   CBC Lab Results  Component Value Date   WBC 5.0 04/21/2023   HGB 12.3 04/21/2023   HCT 36.6 04/21/2023   MCV 85.9 04/21/2023   PLT 278 04/21/2023    BMET    Component Value Date/Time   NA 135 11/14/2022 0251   NA 139 10/11/2022 1109   K 3.5 11/14/2022 0251   CL 106 11/14/2022 0251   CO2 22 11/14/2022 0251   GLUCOSE 137 (H) 11/14/2022 0251   BUN 8 06/13/2023 1130   BUN 10 10/11/2022 1109   CREATININE 0.88 06/13/2023 1130   CALCIUM 8.6 (L) 11/14/2022 0251   GFRNONAA >60 06/13/2023 1130   Estimated Creatinine Clearance: 110.6 mL/min (by C-G formula based on SCr of 0.88 mg/dL).  COAG No results found for: "INR", "PROTIME"  Radiology PERIPHERAL VASCULAR CATHETERIZATION Result Date: 06/13/2023 See surgical note for result.    Assessment/Plan 1. Uterine leiomyoma, unspecified location (Primary) The patient is status post successful uterine fibroid embolization.  She appears to be having a normal postprocedural course.  At this point she will contact Dr. Logan Bores for follow-up.  We will continue to observe over the next several months to see how effective embolization has been at reducing the overall menstrual flow.  2. Iron deficiency anemia due to chronic blood loss Patient will continue treating her iron deficiency anemia secondary to blood loss.  Hopefully now that we have successfully embolized her fibroids we will be able to get better control of her anemia.    Levora Dredge, MD  06/25/2023 1:39 PM

## 2023-06-26 ENCOUNTER — Encounter (INDEPENDENT_AMBULATORY_CARE_PROVIDER_SITE_OTHER): Payer: Self-pay | Admitting: Vascular Surgery

## 2023-06-26 ENCOUNTER — Ambulatory Visit (INDEPENDENT_AMBULATORY_CARE_PROVIDER_SITE_OTHER): Payer: 59 | Admitting: Vascular Surgery

## 2023-06-26 VITALS — BP 133/88 | HR 74 | Resp 18 | Ht 64.0 in | Wt 246.2 lb

## 2023-06-26 DIAGNOSIS — D500 Iron deficiency anemia secondary to blood loss (chronic): Secondary | ICD-10-CM | POA: Diagnosis not present

## 2023-06-26 DIAGNOSIS — D259 Leiomyoma of uterus, unspecified: Secondary | ICD-10-CM | POA: Diagnosis not present

## 2023-07-03 ENCOUNTER — Telehealth (INDEPENDENT_AMBULATORY_CARE_PROVIDER_SITE_OTHER): Payer: Self-pay | Admitting: Nurse Practitioner

## 2023-07-03 NOTE — Telephone Encounter (Signed)
 I contacted the patient regarding her FMLA paperwork to request:  Dates that she was out (start to end dates) and if she felt she required any additional accommodations.  Patient is requested to call us back with information.

## 2023-07-30 ENCOUNTER — Other Ambulatory Visit: Payer: Self-pay | Admitting: Family Medicine

## 2023-07-30 DIAGNOSIS — K219 Gastro-esophageal reflux disease without esophagitis: Secondary | ICD-10-CM

## 2023-08-01 NOTE — Telephone Encounter (Signed)
 Last OV 10/11/22 Requested Prescriptions  Pending Prescriptions Disp Refills   omeprazole (PRILOSEC) 40 MG capsule [Pharmacy Med Name: OMEPRAZOLE DR 40 MG CAPSULE] 90 capsule 0    Sig: TAKE 1 CAPSULE BY MOUTH DAILY AS NEEDED.     Gastroenterology: Proton Pump Inhibitors Failed - 08/01/2023  8:43 AM      Failed - Valid encounter within last 12 months    Recent Outpatient Visits   None     Future Appointments             In 2 months Ashley Royalty, Ocie Bob, MD [facility] at [facility], [facility]

## 2023-08-24 ENCOUNTER — Encounter (INDEPENDENT_AMBULATORY_CARE_PROVIDER_SITE_OTHER): Payer: Self-pay | Admitting: Vascular Surgery

## 2023-08-24 ENCOUNTER — Ambulatory Visit (INDEPENDENT_AMBULATORY_CARE_PROVIDER_SITE_OTHER): Admitting: Vascular Surgery

## 2023-08-24 VITALS — BP 133/87 | HR 66 | Resp 18 | Ht 64.0 in | Wt 243.8 lb

## 2023-08-24 DIAGNOSIS — E782 Mixed hyperlipidemia: Secondary | ICD-10-CM | POA: Diagnosis not present

## 2023-08-24 DIAGNOSIS — D259 Leiomyoma of uterus, unspecified: Secondary | ICD-10-CM | POA: Diagnosis not present

## 2023-08-24 DIAGNOSIS — K219 Gastro-esophageal reflux disease without esophagitis: Secondary | ICD-10-CM

## 2023-08-24 DIAGNOSIS — N924 Excessive bleeding in the premenopausal period: Secondary | ICD-10-CM | POA: Diagnosis not present

## 2023-08-24 NOTE — Progress Notes (Unsigned)
 MRN : [id number]  Eileen Vargas is a 33 y.o. (1990-08-18) female who presents with chief complaint of check circulation.  History of Present Illness:   The patient returns to the office for followup and review status post angiogram with intervention on 06/13/2023.   Procedure: Embolization of uterine fibroids bilateral uterine artery approach   Patient notes that her pain is much much better.  Mostly it has been eliminated.  However she still notes she is having fairly heavy menstrual cycles.  She has not been able to good back into see her GYN yet so we are uncertain as to whether there has been a significant change in size.  She has an appointment in June.  There have been no significant changes to the patient's overall health care.   No documented history of DVT, PE or superficial thrombophlebitis.  The patient denies recent episodes of chest pain or shortness of breath.    No outpatient medications have been marked as taking for the 08/24/23 encounter (Appointment) with Prescilla Brod, Ninette Basque, MD.    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    Past Surgical History:  Procedure Laterality Date   CHOLECYSTECTOMY  2013   EMBOLIZATION (CATH LAB) N/A 06/13/2023   Procedure: EMBOLIZATION;  Surgeon: Jackquelyn Mass, MD;  Location: [facility];  Service: Cardiovascular;  Laterality: N/A;   EXTRACORPOREAL SHOCK WAVE LITHOTRIPSY Left 12/01/2022   Procedure: EXTRACORPOREAL SHOCK WAVE LITHOTRIPSY (ESWL);  Surgeon: Dustin Gimenez, MD;  Location: [facility];  Service: Urology;  Laterality: Left;    Social History Social History   Tobacco Use   Smoking status: Never    Passive exposure: Never   Smokeless tobacco: Never  Vaping Use   Vaping status: Never Used  Substance Use  Topics   Alcohol use: Yes    Alcohol/week: 1.0 standard drink of alcohol    Types: 1 Standard drinks or equivalent per week    Comment: Max numbers only   Drug use: Never    Family History Family History  Problem Relation Age of Onset   Diabetes Maternal Grandmother    Lung cancer Maternal Grandfather    Kidney disease Mother    Clotting disorder Father    Sickle cell trait Father    Early death Father     No Known Allergies   REVIEW OF SYSTEMS (Negative unless checked)  Constitutional: [] Weight loss  [] Fever  [] Chills Cardiac: [] Chest pain   [] Chest pressure   [] Palpitations   [] Shortness of breath when laying flat   [] Shortness of breath with exertion. Vascular:  [x] Pain in legs with walking   [] Pain in legs at rest  [] History of DVT   [] Phlebitis   [] Swelling in legs   [] Varicose veins   [] Non-healing ulcers Pulmonary:   [] Uses home oxygen   [] Productive cough   [] Hemoptysis   [] Wheeze  [] COPD   [] Asthma Neurologic:  [] Dizziness   [] Seizures   [] History of  stroke   [] History of TIA  [] Aphasia   [] Vissual changes   [] Weakness or numbness in arm   [] Weakness or numbness in leg Musculoskeletal:   [] Joint swelling   [x] Joint pain   [x] Low back pain Hematologic:  [] Easy bruising  [] Easy bleeding   [] Hypercoagulable state   [] Anemic Gastrointestinal:  [] Diarrhea   [] Vomiting  [x] Gastroesophageal reflux/heartburn   [] Difficulty swallowing. Genitourinary:  [] Chronic kidney disease   [] Difficult urination  [] Frequent urination   [] Blood in urine Skin:  [] Rashes   [] Ulcers  Psychological:  [] History of anxiety   []  History of major depression.  Physical Examination  There were no vitals filed for this visit. There is no height or weight on file to calculate BMI. Gen: WD/WN, NAD Head: [place]/AT, No temporalis wasting.  Ear/Nose/Throat: Hearing grossly intact, nares w/o erythema or drainage Eyes: PER, EOMI, sclera nonicteric.  Neck: Supple, no masses.  No bruit or JVD.  Pulmonary:   Good air movement, no audible wheezing, no use of accessory muscles.  Cardiac: RRR, normal S1, S2, no Murmurs. Vascular:  mild trophic changes, no open wounds Vessel Right Left  Radial Palpable Palpable  Gastrointestinal: soft, non-distended. No guarding/no peritoneal signs.  Musculoskeletal: M/S 5/5 throughout.  No visible deformity.  Neurologic: CN 2-12 intact. Pain and light touch intact in extremities.  Symmetrical.  Speech is fluent. Motor exam as listed above. Psychiatric: Judgment intact, Mood & affect appropriate for pt's clinical situation. Dermatologic: No rashes or ulcers noted.  No changes consistent with cellulitis.   CBC Lab Results  Component Value Date   WBC 5.0 04/21/2023   HGB 12.3 04/21/2023   HCT 36.6 04/21/2023   MCV 85.9 04/21/2023   PLT 278 04/21/2023    BMET    Component Value Date/Time   NA 135 11/14/2022 0251   NA 139 10/11/2022 1109   K 3.5 11/14/2022 0251   CL 106 11/14/2022 0251   CO2 22 11/14/2022 0251   GLUCOSE 137 (H) 11/14/2022 0251   BUN 8 06/13/2023 1130   BUN 10 10/11/2022 1109   CREATININE 0.88 06/13/2023 1130   CALCIUM 8.6 (L) 11/14/2022 0251   GFRNONAA >60 06/13/2023 1130   CrCl cannot be calculated (Patient's most recent lab result is older than the maximum 21 days allowed.).  COAG No results found for: "INR", "PROTIME"  Radiology No results found.   Assessment/Plan 1. Excessive bleeding in premenopausal period (Primary) The patient is still having some bleeding but sounds like it is slightly improved but not significantly so.  Her pain has been eliminated which she is very happy about.  She has not yet seen her GYN.  She is scheduled to follow-up in June.  She will follow-up with me pending her GYN appointment.  No further intervention planned at this time but we did discuss that it is possible to perform a follow-up embolization if needed.  2. Uterine leiomyoma, unspecified location The patient is still having some bleeding  but sounds like it is slightly improved but not significantly so.  Her pain has been eliminated which she is very happy about.  She has not yet seen her GYN.  She is scheduled to follow-up in June.  She will follow-up with me pending her GYN appointment.  No further intervention planned at this time but we did discuss that it is possible to perform a follow-up embolization if needed.  3. Mixed hyperlipidemia Continue statin as ordered and reviewed, no changes at this time  4. Gastroesophageal reflux disease without  esophagitis Continue PPI as already ordered, this medication has been reviewed and there are no changes at this time.  Avoidence of caffeine and alcohol  Moderate elevation of the head of the bed     Devon Fogo, MD  08/24/2023 12:00 PM

## 2023-08-26 ENCOUNTER — Encounter (INDEPENDENT_AMBULATORY_CARE_PROVIDER_SITE_OTHER): Payer: Self-pay | Admitting: Vascular Surgery

## 2023-09-12 ENCOUNTER — Encounter (INDEPENDENT_AMBULATORY_CARE_PROVIDER_SITE_OTHER): Payer: Self-pay

## 2023-10-13 ENCOUNTER — Encounter: Payer: Self-pay | Admitting: Family Medicine

## 2023-12-04 ENCOUNTER — Other Ambulatory Visit: Payer: Self-pay | Admitting: Family Medicine

## 2023-12-04 DIAGNOSIS — K219 Gastro-esophageal reflux disease without esophagitis: Secondary | ICD-10-CM

## 2023-12-07 NOTE — Telephone Encounter (Signed)
<   3 months overdue OV  30 day courtesy refill provided. Sent pt MyChart message to call office and make appt.  Requested Prescriptions  Pending Prescriptions Disp Refills   omeprazole  (PRILOSEC) 40 MG capsule [Pharmacy Med Name: OMEPRAZOLE  DR 40 MG CAPSULE] 30 capsule 0    Sig: TAKE 1 CAPSULE BY MOUTH DAILY AS NEEDED.     Gastroenterology: Proton Pump Inhibitors Failed - 12/07/2023 12:45 PM      Failed - Valid encounter within last 12 months    Recent Outpatient Visits   None

## 2023-12-15 ENCOUNTER — Ambulatory Visit (INDEPENDENT_AMBULATORY_CARE_PROVIDER_SITE_OTHER): Admitting: Family Medicine

## 2023-12-15 VITALS — BP 140/70 | HR 85 | Ht 64.0 in | Wt 249.0 lb

## 2023-12-15 DIAGNOSIS — D500 Iron deficiency anemia secondary to blood loss (chronic): Secondary | ICD-10-CM | POA: Diagnosis not present

## 2023-12-15 DIAGNOSIS — M5417 Radiculopathy, lumbosacral region: Secondary | ICD-10-CM | POA: Diagnosis not present

## 2023-12-15 DIAGNOSIS — N924 Excessive bleeding in the premenopausal period: Secondary | ICD-10-CM

## 2023-12-15 DIAGNOSIS — Z23 Encounter for immunization: Secondary | ICD-10-CM

## 2023-12-15 DIAGNOSIS — K219 Gastro-esophageal reflux disease without esophagitis: Secondary | ICD-10-CM

## 2023-12-15 DIAGNOSIS — F32A Depression, unspecified: Secondary | ICD-10-CM

## 2023-12-15 DIAGNOSIS — Z0000 Encounter for general adult medical examination without abnormal findings: Secondary | ICD-10-CM | POA: Diagnosis not present

## 2023-12-15 DIAGNOSIS — F419 Anxiety disorder, unspecified: Secondary | ICD-10-CM | POA: Diagnosis not present

## 2023-12-15 MED ORDER — GABAPENTIN 100 MG PO CAPS: 100.0000 mg | ORAL_CAPSULE | Freq: Three times a day (TID) | ORAL | 0 refills | Status: AC | PRN

## 2023-12-15 MED ORDER — CITALOPRAM HYDROBROMIDE 20 MG PO TABS: 20.0000 mg | ORAL_TABLET | Freq: Every day | ORAL | 0 refills | Status: DC

## 2023-12-15 NOTE — Progress Notes (Signed)
 Annual Physical Exam Visit  Patient Information:  Patient ID: Eileen Vargas, female DOB: 08/12/1990 Age: 33 y.o. MRN: [id number]   Subjective:   CC: Annual Physical Exam  HPI:  Eileen Vargas is here for their annual physical.  I reviewed the past medical history, family history, social history, surgical history, and allergies today and changes were made as necessary.  Please see the problem list section below for additional details.  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   Past Surgical History: Past Surgical History:  Procedure Laterality Date   CHOLECYSTECTOMY  2013   EMBOLIZATION (CATH LAB) N/A 06/13/2023   Procedure: EMBOLIZATION;  Surgeon: Jama Cordella MATSU, MD;  Location: [facility] INVASIVE CV LAB;  Service: Cardiovascular;  Laterality: N/A;   EXTRACORPOREAL SHOCK WAVE LITHOTRIPSY Left 12/01/2022   Procedure: EXTRACORPOREAL SHOCK WAVE LITHOTRIPSY (ESWL);  Surgeon: Penne Knee, MD;  Location: [facility] ORS;  Service: Urology;  Laterality: Left;   Family History: Family History  Problem Relation Age of Onset   Diabetes Maternal Grandmother    Lung cancer Maternal Grandfather    Kidney disease Mother    Clotting disorder Father    Sickle cell trait Father    Early death Father    Allergies: No Known Allergies Health Maintenance: Health Maintenance  Topic Date Due   Hepatitis B Vaccines 19-59 Average Risk (1 of 3 - 19+ 3-dose series) Never done   HPV VACCINES (2 - 3-dose SCDM series) 01/12/2024   COVID-19 Vaccine (4 - 2024-25 season) 12/31/2023 (Originally 12/25/2022)   INFLUENZA VACCINE  07/23/2024 (Originally 11/24/2023)   Cervical Cancer Screening (HPV/Pap Cotest)  07/23/2025   DTaP/Tdap/Td (2 - Td or Tdap) 08/27/2031   Pneumococcal Vaccine  Completed   Hepatitis C Screening  Completed   HIV Screening  Completed    Meningococcal B Vaccine  Aged Out    HM Colonoscopy   This patient has no relevant Health Maintenance data.    Medications: Current Outpatient Medications on File Prior to Visit  Medication Sig Dispense Refill   Iron -Vitamin C (VITRON-C) 65-125 MG TABS Take 1 tablet by mouth daily. 90 tablet 1   omeprazole  (PRILOSEC) 40 MG capsule TAKE 1 CAPSULE BY MOUTH DAILY AS NEEDED. 30 capsule 0   ondansetron  (ZOFRAN ) 4 MG tablet Take 1 tablet (4 mg total) by mouth every 6 (six) hours as needed for nausea or vomiting. 30 tablet 1   oxyCODONE -acetaminophen  (PERCOCET) 5-325 MG tablet Take 1-2 tablets by mouth every 6 (six) hours as needed for severe pain (pain score 7-10) or moderate pain (pain score 4-6). 40 tablet 0   Vitamin D , Ergocalciferol , (DRISDOL ) 1.25 MG (50000 UNIT) CAPS capsule Take 1 capsule (50,000 Units total) by mouth every 7 (seven) days. Take for 8 total doses(weeks) 8 capsule 0   No current facility-administered medications on file prior to visit.    Objective:   Vitals:   12/15/23 1034  BP: (!) 140/70  Pulse: 85  SpO2: 99%   Vitals:   12/15/23 1034  Weight: 249 lb (112.9 kg)  Height: 5' 4 (1.626 m)   Body mass index is 42.74 kg/m.  General: Well Developed, well nourished, and in no acute distress.  Neuro: Alert and oriented x3, extra-ocular muscles intact, sensation grossly intact. Cranial nerves II through XII are grossly intact, motor, sensory, and  coordinative functions are intact. HEENT: Normocephalic, atraumatic, neck supple, no masses, no lymphadenopathy, thyroid  nonenlarged. Oropharynx, nasopharynx, external ear canals are unremarkable. Skin: Warm and dry, no rashes noted.  Cardiac: Regular rate and rhythm, no murmurs rubs or gallops. No peripheral edema. Pulses symmetric. Respiratory: Clear to auscultation bilaterally. Speaking in full sentences.  Abdominal: Soft, nontender, nondistended, positive bowel sounds, no masses, no organomegaly. Musculoskeletal:  Stable, and with full range of motion.  Impression and Recommendations:   The patient was counselled, risk factors were discussed, and anticipatory guidance given.  Problem List Items Addressed This Visit     Anxiety and depression   Anxiety and depression - Takes Celexa  (citalopram ) for depression, which is effective for depressive symptoms but not for anxiety - Has been off Celexa  for two weeks, resulting in increased anxiety - No prior trials of other SSRIs such as Prozac or Zoloft  Major depressive disorder and generalized anxiety disorder Depression well-managed with citalopram . Anxiety not adequately controlled. Discussed switching to sertraline post-[place] trip. - Continue citalopram  20 mg daily, starting with 10 mg (half tablet) for a few days, then increase to 20 mg. - Plan to switch to sertraline after [place] trip in September. - Schedule video visit in two months to reassess symptoms and medication efficacy.      Relevant Medications   citalopram  (CELEXA ) 20 MG tablet   Gastroesophageal reflux disease without esophagitis   Gastroesophageal reflux symptoms - Reflux symptoms have not worsened - Able to go multiple days without experiencing heartburn  Gastroesophageal reflux disease Symptoms well-managed, no worsening. - Continue medication as needed for heartburn.      Iron  deficiency anemia   Follows with Hematology      Menorrhagia   Menstrual bleeding and post-embolization symptoms - Underwent embolization procedure for cycle-related excessive bleeding - Pain has resolved post-procedure - Continues to experience some vaginal bleeding, with improvement but not significant resolution - Has not yet followed up with gynecology as scheduled      Right lumbosacral radiculopathy   Lower back and right leg pain - Experiences lower back pain and cramping in the right leg approximately once every month and a half - Cramping is now limited to the lower part of the  right leg - Engages in regular stretching due to job requirements, which involve walking approximately five miles daily - Previously used gabapentin  for nerve pain with good effect  Chronic low back and right lower leg pain with intermittent cramping Symptoms improved but persistent. Gabapentin  effective for nerve pain. - Prescribe gabapentin  as needed for nighttime use due to drowsiness. - Discussed potential referral to interventional spine for injections if symptoms worsen.      Relevant Medications   citalopram  (CELEXA ) 20 MG tablet   gabapentin  (NEURONTIN ) 100 MG capsule   Other Visit Diagnoses       Immunization due    -  Primary   Relevant Orders   Pneumococcal conjugate vaccine 20-valent (Completed)     Healthcare maintenance       Relevant Orders   CBC with Differential/Platelet   Comprehensive metabolic panel with GFR   Lipid panel   Hemoglobin A1c        Orders & Medications Medications:  Meds ordered this encounter  Medications   citalopram  (CELEXA ) 20 MG tablet    Sig: Take 1 tablet (20 mg total) by mouth daily.    Dispense:  60 tablet    Refill:  0   gabapentin  (NEURONTIN ) 100 MG capsule  Sig: Take 1 capsule (100 mg total) by mouth 3 (three) times daily as needed.    Dispense:  90 capsule    Refill:  0   Orders Placed This Encounter  Procedures   HPV 9-valent vaccine,Recombinat   Pneumococcal conjugate vaccine 20-valent   CBC with Differential/Platelet   Comprehensive metabolic panel with GFR   Lipid panel   Hemoglobin A1c     No follow-ups on file.    Selinda JINNY Ku, MD, [facility]   Primary Care Sports Medicine Primary Care and Sports Medicine at [facility]

## 2023-12-15 NOTE — Assessment & Plan Note (Signed)
 Anxiety and depression - Takes Celexa  (citalopram ) for depression, which is effective for depressive symptoms but not for anxiety - Has been off Celexa  for two weeks, resulting in increased anxiety - No prior trials of other SSRIs such as Prozac or Zoloft  Major depressive disorder and generalized anxiety disorder Depression well-managed with citalopram . Anxiety not adequately controlled. Discussed switching to sertraline post-[place] trip. - Continue citalopram  20 mg daily, starting with 10 mg (half tablet) for a few days, then increase to 20 mg. - Plan to switch to sertraline after [place] trip in September. - Schedule video visit in two months to reassess symptoms and medication efficacy.

## 2023-12-15 NOTE — Assessment & Plan Note (Signed)
 Menstrual bleeding and post-embolization symptoms - Underwent embolization procedure for cycle-related excessive bleeding - Pain has resolved post-procedure - Continues to experience some vaginal bleeding, with improvement but not significant resolution - Has not yet followed up with gynecology as scheduled

## 2023-12-15 NOTE — Patient Instructions (Addendum)
-   Obtain fasting labs with orders provided (can have water or black coffee but otherwise no food or drink x 8 hours before labs) - Review information provided - Attend eye doctor annually, dentist every 6 months, work towards or maintain 30 minutes of moderate intensity physical activity at least 5 days per week, and consume a balanced diet - Return in 1 year for physical - Contact us  for any questions between now and then   Patient Plan for Post-Visit Guidance  Anxiety and Depression - Restart citalopram : take 10 mg (half tablet) daily for a few days, then increase to 20 mg daily. - Plan to switch to sertraline after your [place] trip in September. - Schedule a video visit in two months to review symptoms and medication response.  Gastroesophageal Reflux Disease - Continue taking medication as needed for heartburn.  Iron  Deficiency Anemia - Continue follow-up with Hematology as scheduled.  Menorrhagia - Follow up with gynecology as previously scheduled for ongoing vaginal bleeding.  Right Lumbosacral Radiculopathy - Take gabapentin  100 mg at night as needed for nerve pain. - Continue regular stretching and walking as tolerated. - If symptoms worsen, discuss possible referral to interventional spine for injections.  Red Flags - If you experience severe mood changes, thoughts of self-harm, chest pain, severe or sudden back or leg pain, loss of bladder or bowel control, heavy or persistent vaginal bleeding, or new or worsening symptoms, seek medical attention promptly.

## 2023-12-15 NOTE — Assessment & Plan Note (Signed)
 Lower back and right leg pain - Experiences lower back pain and cramping in the right leg approximately once every month and a half - Cramping is now limited to the lower part of the right leg - Engages in regular stretching due to job requirements, which involve walking approximately five miles daily - Previously used gabapentin  for nerve pain with good effect  Chronic low back and right lower leg pain with intermittent cramping Symptoms improved but persistent. Gabapentin  effective for nerve pain. - Prescribe gabapentin  as needed for nighttime use due to drowsiness. - Discussed potential referral to interventional spine for injections if symptoms worsen.

## 2023-12-15 NOTE — Assessment & Plan Note (Signed)
 Follows with Hematology.

## 2023-12-15 NOTE — Assessment & Plan Note (Signed)
 Gastroesophageal reflux symptoms - Reflux symptoms have not worsened - Able to go multiple days without experiencing heartburn  Gastroesophageal reflux disease Symptoms well-managed, no worsening. - Continue medication as needed for heartburn.

## 2023-12-16 LAB — CBC WITH DIFFERENTIAL/PLATELET
Basophils Absolute: 0 x10E3/uL (ref 0.0–0.2)
Basos: 1 %
EOS (ABSOLUTE): 0.1 x10E3/uL (ref 0.0–0.4)
Eos: 1 %
Hematocrit: 29.1 % — ABNORMAL LOW (ref 34.0–46.6)
Hemoglobin: 8.3 g/dL — ABNORMAL LOW (ref 11.1–15.9)
Immature Grans (Abs): 0 x10E3/uL (ref 0.0–0.1)
Immature Granulocytes: 0 %
Lymphocytes Absolute: 2.3 x10E3/uL (ref 0.7–3.1)
Lymphs: 37 %
MCH: 20.8 pg — ABNORMAL LOW (ref 26.6–33.0)
MCHC: 28.5 g/dL — ABNORMAL LOW (ref 31.5–35.7)
MCV: 73 fL — ABNORMAL LOW (ref 79–97)
Monocytes Absolute: 0.5 x10E3/uL (ref 0.1–0.9)
Monocytes: 8 %
Neutrophils Absolute: 3.3 x10E3/uL (ref 1.4–7.0)
Neutrophils: 53 %
Platelets: 320 x10E3/uL (ref 150–450)
RBC: 4 x10E6/uL (ref 3.77–5.28)
RDW: 17.2 % — ABNORMAL HIGH (ref 11.7–15.4)
WBC: 6.2 x10E3/uL (ref 3.4–10.8)

## 2023-12-16 LAB — LIPID PANEL
Chol/HDL Ratio: 4 ratio (ref 0.0–4.4)
Cholesterol, Total: 144 mg/dL (ref 100–199)
HDL: 36 mg/dL — ABNORMAL LOW (ref >39)
LDL Chol Calc (NIH): 84 mg/dL (ref 0–99)
Triglycerides: 134 mg/dL (ref 0–149)
VLDL Cholesterol Cal: 24 mg/dL (ref 5–40)

## 2023-12-16 LAB — COMPREHENSIVE METABOLIC PANEL WITH GFR
ALT: 10 IU/L (ref 0–32)
AST: 13 IU/L (ref 0–40)
Albumin: 3.9 g/dL (ref 3.9–4.9)
Alkaline Phosphatase: 70 IU/L (ref 44–121)
BUN/Creatinine Ratio: 16 (ref 9–23)
BUN: 13 mg/dL (ref 6–20)
Bilirubin Total: <0.2 mg/dL (ref 0.0–1.2)
CO2: 20 mmol/L (ref 20–29)
Calcium: 8.9 mg/dL (ref 8.7–10.2)
Chloride: 105 mmol/L (ref 96–106)
Creatinine, Ser: 0.79 mg/dL (ref 0.57–1.00)
Globulin, Total: 3.3 g/dL (ref 1.5–4.5)
Glucose: 91 mg/dL (ref 70–99)
Potassium: 4.2 mmol/L (ref 3.5–5.2)
Sodium: 138 mmol/L (ref 134–144)
Total Protein: 7.2 g/dL (ref 6.0–8.5)
eGFR: 100 mL/min/1.73 (ref >59)

## 2023-12-16 LAB — HEMOGLOBIN A1C
Est. average glucose Bld gHb Est-mCnc: 117 mg/dL
Hgb A1c MFr Bld: 5.7 % — ABNORMAL HIGH (ref 4.8–5.6)

## 2023-12-18 ENCOUNTER — Ambulatory Visit: Payer: Self-pay | Admitting: Family Medicine

## 2024-01-07 ENCOUNTER — Other Ambulatory Visit: Payer: Self-pay | Admitting: Family Medicine

## 2024-01-07 DIAGNOSIS — K219 Gastro-esophageal reflux disease without esophagitis: Secondary | ICD-10-CM

## 2024-01-09 NOTE — Telephone Encounter (Signed)
 Requested Prescriptions  Pending Prescriptions Disp Refills   omeprazole  (PRILOSEC) 40 MG capsule [Pharmacy Med Name: OMEPRAZOLE  DR 40 MG CAPSULE] 90 capsule 0    Sig: TAKE 1 CAPSULE BY MOUTH EVERY DAY AS NEEDED     Gastroenterology: Proton Pump Inhibitors Passed - 01/09/2024 10:41 AM      Passed - Valid encounter within last 12 months    Recent Outpatient Visits           3 weeks ago Immunization due   [facility] Medicine at [facility], Selinda PARAS, MD       Future Appointments             In 1 month Alvia, Selinda PARAS, MD [facility] Primary [facility] Medicine at [facility], [place]   In 11 months Alvia, Selinda PARAS, MD [facility] Primary Care & Sports Medicine at [facility], [phone] [place]

## 2024-02-14 ENCOUNTER — Telehealth: Payer: Self-pay

## 2024-02-14 ENCOUNTER — Telehealth: Admitting: Family Medicine

## 2024-02-14 ENCOUNTER — Encounter: Payer: Self-pay | Admitting: Family Medicine

## 2024-02-14 NOTE — Telephone Encounter (Signed)
 Called patient and left VM for her to call back in regards to her VV with Dr. Alvia today at 10 am.  ONA

## 2024-03-13 ENCOUNTER — Telehealth: Payer: Self-pay | Admitting: Oncology

## 2024-03-13 NOTE — Telephone Encounter (Signed)
 Patient called to schedule appointment with her provider. Looks like she was last seen 12/24 by Tinnie and follow up was not scheduled. Please advise how we can get her rescheduled.  Thank you

## 2024-03-14 ENCOUNTER — Encounter: Payer: Self-pay | Admitting: Family Medicine

## 2024-03-14 ENCOUNTER — Ambulatory Visit: Admitting: Family Medicine

## 2024-03-14 ENCOUNTER — Telehealth: Payer: Self-pay | Admitting: Nurse Practitioner

## 2024-03-14 VITALS — BP 112/70 | HR 94 | Ht 64.0 in | Wt 243.0 lb

## 2024-03-14 DIAGNOSIS — D500 Iron deficiency anemia secondary to blood loss (chronic): Secondary | ICD-10-CM

## 2024-03-14 DIAGNOSIS — R7303 Prediabetes: Secondary | ICD-10-CM

## 2024-03-14 DIAGNOSIS — F32A Depression, unspecified: Secondary | ICD-10-CM | POA: Diagnosis not present

## 2024-03-14 DIAGNOSIS — F419 Anxiety disorder, unspecified: Secondary | ICD-10-CM | POA: Diagnosis not present

## 2024-03-14 MED ORDER — TRAZODONE HCL 50 MG PO TABS: 25.0000 mg | ORAL_TABLET | Freq: Every evening | ORAL | 0 refills | Status: AC | PRN

## 2024-03-14 MED ORDER — CITALOPRAM HYDROBROMIDE 20 MG PO TABS: 20.0000 mg | ORAL_TABLET | Freq: Every day | ORAL | 1 refills | Status: DC

## 2024-03-14 NOTE — Telephone Encounter (Signed)
 Thanks. I called the pt no answer so I left a vm for her to return my call so I can get her scheduled with Tinnie Dawn. I will make a phone note as well.

## 2024-03-23 NOTE — Progress Notes (Signed)
 Primary Care / Sports Medicine Office Visit  Patient Information:  Patient ID: Eileen Vargas, female DOB: 12/13/1990 Age: 33 y.o. MRN: [id number]   Eileen Vargas is a pleasant 33 y.o. female presenting with the following:  Chief Complaint  Patient presents with   Med Change Request    Patient presents today to discuss dosage change or medication change for her Celexa . She feels the medication is controlling her depression but not her anxiety.    Vitals:   03/14/24 1523  BP: 112/70  Pulse: 94  SpO2: 98%   Vitals:   03/14/24 1523  Weight: 243 lb (110.2 kg)  Height: 5' 4 (1.626 m)   Body mass index is 41.71 kg/m.  No results found.   Discussed the use of AI scribe software for clinical note transcription with the patient, who gave verbal consent to proceed.   Independent interpretation of notes and tests performed by another provider:   None  Procedures performed:   None  Pertinent History, Exam, Impression, and Recommendations:   History of Present Illness Eileen Vargas is a 33 year old female who presents with increased irritability and sleep disturbances after discontinuing Celexa . She is accompanied by her partner, who provides observations about her sleep patterns.  Sleep disturbance and irritability - Increased irritability and sleep disturbances following discontinuation of Celexa  two weeks ago - Celexa  had previously improved sleep quality; without it, she is not achieving restful sleep - Partner observes disrupted sleep patterns - No worsening of depression, possibly due to engagement with work and activities - Elevated anxiety levels since stopping Celexa   Celexa  use and adverse effects - Celexa  discontinued two weeks ago - Experienced mild headaches when initiating Celexa , which resolved after a few hours  Anemia - History of anemia - Recent laboratory tests show low hemoglobin and hematocrit - No follow-up with hematology or gynecology to  date  Prediabetes management - Managing prediabetes by reducing intake of sweets and switching to zero sugar sodas - Partner provides support in dietary modifications  Assessment and Plan Major depressive disorder with comorbid anxiety and insomnia Increased irritability and anxiety post-citalopram  discontinuation. Sleep disturbances present. Depression symptoms minimal. PHQ score slightly elevated. Anxiety score normal. Discussed restarting citalopram  at higher dose. Considered sertraline if citalopram  ineffective. Discussed dopamine-targeting medication addition if needed. Trazodone  offered for sleep aid. - Restart citalopram  at 20 mg daily, increase to 40 mg as tolerated. - Scheduled two-month follow-up video visit to assess medication response. - Provided trazodone  50 mg for sleep as needed. - Sent non-medication resources for sleep hygiene and mindfulness.  Anemia, unspecified etiology Chronic low hemoglobin since last December. Possible causes: gastrointestinal bleeding, menstrual blood loss. Symptoms: fatigue, low energy. Emphasized anemia's impact on health. Discussed hematology and gynecology roles in management. - Ordered repeat hemoglobin and hematocrit labs. - Instructed to contact hematology and gynecology for further evaluation and management. - Encouraged dietary intake of iron -rich foods such as proteins and leafy greens.  Prediabetes Recent lifestyle changes include reduced sugar intake and zero-sugar soda. Emphasized reducing carbohydrate intake for blood sugar management. - Ordered A1c test to monitor blood sugar levels. - Encouraged continued dietary modifications focusing on reducing carbohydrate intake.  Problem List Items Addressed This Visit     Anxiety and depression   Relevant Medications   citalopram  (CELEXA ) 20 MG tablet   traZODone  (DESYREL ) 50 MG tablet     Orders & Medications Medications:  Meds ordered this encounter  Medications   citalopram   (CELEXA ) 20  MG tablet    Sig: Take 1 tablet (20 mg total) by mouth daily. Can increase to 40 mg after at least 1 week    Dispense:  60 tablet    Refill:  1   traZODone  (DESYREL ) 50 MG tablet    Sig: Take 0.5-1 tablets (25-50 mg total) by mouth at bedtime as needed for sleep.    Dispense:  30 tablet    Refill:  0   No orders of the defined types were placed in this encounter.    Return in about 2 months (around 05/14/2024) for MyChart Video Visit - mood, labs.     Selinda JINNY Ku, MD, [facility]   Primary Care Sports Medicine Primary Care and Sports Medicine at [facility]

## 2024-03-23 NOTE — Patient Instructions (Signed)
 VISIT SUMMARY:  During your visit, we discussed your increased irritability and sleep disturbances after stopping Celexa , your history of anemia, and your management of prediabetes. We have made some adjustments to your medications and provided additional resources to help manage your symptoms.  YOUR PLAN:  INCREASED IRRITABILITY AND SLEEP DISTURBANCES: You have been experiencing increased irritability and sleep disturbances since discontinuing Celexa  two weeks ago. -Restart citalopram  at 20 mg daily, and increase to 40 mg as tolerated. -We scheduled a follow-up video visit in two months to assess your response to the medication. -You can take trazodone  50 mg as needed to help with sleep. -We provided non-medication resources for sleep hygiene and mindfulness.  ANEMIA: You have a history of anemia with recent lab tests showing low hemoglobin and hematocrit levels. -We ordered repeat hemoglobin and hematocrit labs. -Please contact hematology and gynecology for further evaluation and management. -Increase your dietary intake of iron -rich foods such as proteins and leafy greens.  PREDIABETES: You are managing prediabetes by reducing your intake of sweets and switching to zero-sugar sodas. -We ordered an A1c test to monitor your blood sugar levels. -Continue with dietary modifications focusing on reducing carbohydrate intake.

## 2024-03-28 ENCOUNTER — Inpatient Hospital Stay: Attending: Nurse Practitioner | Admitting: Nurse Practitioner

## 2024-03-28 ENCOUNTER — Encounter: Payer: Self-pay | Admitting: Nurse Practitioner

## 2024-03-28 ENCOUNTER — Inpatient Hospital Stay

## 2024-03-28 VITALS — BP 136/84 | HR 68 | Temp 98.2°F | Ht 64.0 in | Wt 236.0 lb

## 2024-03-28 DIAGNOSIS — E785 Hyperlipidemia, unspecified: Secondary | ICD-10-CM | POA: Diagnosis not present

## 2024-03-28 DIAGNOSIS — Z801 Family history of malignant neoplasm of trachea, bronchus and lung: Secondary | ICD-10-CM | POA: Insufficient documentation

## 2024-03-28 DIAGNOSIS — Z79899 Other long term (current) drug therapy: Secondary | ICD-10-CM | POA: Insufficient documentation

## 2024-03-28 DIAGNOSIS — D500 Iron deficiency anemia secondary to blood loss (chronic): Secondary | ICD-10-CM

## 2024-03-28 DIAGNOSIS — D571 Sickle-cell disease without crisis: Secondary | ICD-10-CM | POA: Insufficient documentation

## 2024-03-28 DIAGNOSIS — K219 Gastro-esophageal reflux disease without esophagitis: Secondary | ICD-10-CM | POA: Insufficient documentation

## 2024-03-28 DIAGNOSIS — R5383 Other fatigue: Secondary | ICD-10-CM | POA: Insufficient documentation

## 2024-03-28 DIAGNOSIS — N920 Excessive and frequent menstruation with regular cycle: Secondary | ICD-10-CM | POA: Insufficient documentation

## 2024-03-28 DIAGNOSIS — D509 Iron deficiency anemia, unspecified: Secondary | ICD-10-CM | POA: Insufficient documentation

## 2024-03-28 LAB — CBC WITH DIFFERENTIAL/PLATELET
Abs Immature Granulocytes: 0.01 K/uL (ref 0.00–0.07)
Basophils Absolute: 0 K/uL (ref 0.0–0.1)
Basophils Relative: 1 %
Eosinophils Absolute: 0.1 K/uL (ref 0.0–0.5)
Eosinophils Relative: 1 %
HCT: 30.5 % — ABNORMAL LOW (ref 36.0–46.0)
Hemoglobin: 9.8 g/dL — ABNORMAL LOW (ref 12.0–15.0)
Immature Granulocytes: 0 %
Lymphocytes Relative: 40 %
Lymphs Abs: 2.3 K/uL (ref 0.7–4.0)
MCH: 24.1 pg — ABNORMAL LOW (ref 26.0–34.0)
MCHC: 32.1 g/dL (ref 30.0–36.0)
MCV: 75.1 fL — ABNORMAL LOW (ref 80.0–100.0)
Monocytes Absolute: 0.4 K/uL (ref 0.1–1.0)
Monocytes Relative: 6 %
Neutro Abs: 2.9 K/uL (ref 1.7–7.7)
Neutrophils Relative %: 52 %
Platelets: 277 K/uL (ref 150–400)
RBC: 4.06 MIL/uL (ref 3.87–5.11)
RDW: 21.2 % — ABNORMAL HIGH (ref 11.5–15.5)
WBC: 5.7 K/uL (ref 4.0–10.5)
nRBC: 0 % (ref 0.0–0.2)

## 2024-03-28 LAB — IRON AND TIBC
Iron: 23 ug/dL — ABNORMAL LOW (ref 28–170)
Saturation Ratios: 6 % — ABNORMAL LOW (ref 10.4–31.8)
TIBC: 388 ug/dL (ref 250–450)
UIBC: 365 ug/dL

## 2024-03-28 LAB — BASIC METABOLIC PANEL WITH GFR
Anion gap: 9 (ref 5–15)
BUN: 13 mg/dL (ref 6–20)
CO2: 25 mmol/L (ref 22–32)
Calcium: 9.3 mg/dL (ref 8.9–10.3)
Chloride: 106 mmol/L (ref 98–111)
Creatinine, Ser: 0.88 mg/dL (ref 0.44–1.00)
GFR, Estimated: >60 mL/min (ref >60)
Glucose, Bld: 106 mg/dL — ABNORMAL HIGH (ref 70–99)
Potassium: 3.7 mmol/L (ref 3.5–5.1)
Sodium: 141 mmol/L (ref 135–145)

## 2024-03-28 LAB — SAMPLE TO BLOOD BANK

## 2024-03-28 LAB — FERRITIN: Ferritin: 43 ng/mL (ref 11–307)

## 2024-03-28 NOTE — Progress Notes (Signed)
 [facility]  Telephone:(336) [phone] Fax:(336) [phone]  ID: Dodson Job OB: 1991/01/07  MR#: [id number]  RDW#:[id number]  Patient Care Team: Alvia Selinda PARAS, MD as PCP - General (Family Medicine) Jacobo Evalene PARAS, MD as Consulting Physician (Oncology)  CHIEF COMPLAINT: Iron  deficiency anemia  INTERVAL HISTORY: Patient is a 33 year old female with history of iron  deficiency anemia secondary to heavy menses who returns to clinic for follow-up.  She was last seen approximately a year ago.  In the interim, she underwent embolization with Dr. Jama for uterine fibroids resulting in heavy bleeding and subsequently iron  deficiency anemia.  She has been following up with her PCP who noted that her hemoglobin had dropped to 8.3 in August 2025.  Continues to have vaginal bleeding postprocedure that. She has been taking oral iron  but feels fatigued and weak. Similar to when she has needed iv iron  in the past. Denies rectal bleeding or blood in her urine.   REVIEW OF SYSTEMS:   Review of Systems  Constitutional:  Negative for chills, fever, malaise/fatigue and weight loss.  Respiratory:  Negative for cough and shortness of breath.   Cardiovascular:  Negative for chest pain, palpitations, orthopnea and claudication.  Gastrointestinal:  Negative for abdominal pain, blood in stool, constipation, diarrhea, melena, nausea and vomiting.  Genitourinary:  Negative for frequency and hematuria.       Fibroids  Musculoskeletal:  Negative for back pain, joint pain, myalgias and neck pain.  Skin: Negative.   Neurological:  Negative for dizziness, tingling, weakness and headaches.  Endo/Heme/Allergies:  Does not bruise/bleed easily.  Psychiatric/Behavioral:  Negative for depression. The patient is not nervous/anxious.   As per HPI. Otherwise, a complete review of systems is negative.  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    Patient Active Problem List   Diagnosis Date Noted   Menorrhagia 12/28/2022   Symptomatic anemia 08/23/2022   Need for immunization against influenza 03/29/2022   Right lumbosacral radiculopathy 08/12/2021   Hyperlipidemia 02/08/2021   Anxiety and depression 07/13/2020   Gastroesophageal reflux disease without esophagitis 07/13/2020   BMI 40.0-44.9, adult (HCC) 07/13/2020   Fibroid, uterine 07/13/2020   Sickle cell trait 07/01/2020   Iron  deficiency anemia 06/06/2020   PAST SURGICAL HISTORY: Past Surgical History:  Procedure Laterality Date   CHOLECYSTECTOMY  2013   EMBOLIZATION (CATH LAB) N/A 06/13/2023   Procedure: EMBOLIZATION;  Surgeon: Jama Cordella MATSU, MD;  Location: [facility] INVASIVE CV LAB;  Service: Cardiovascular;  Laterality: N/A;   EXTRACORPOREAL SHOCK WAVE LITHOTRIPSY Left 12/01/2022   Procedure: EXTRACORPOREAL SHOCK WAVE LITHOTRIPSY (ESWL);  Surgeon: Penne Knee, MD;  Location: [facility] ORS;  Service: Urology;  Laterality: Left;   FAMILY HISTORY: Family History  Problem Relation Age of Onset   Diabetes Maternal Grandmother    Lung cancer Maternal Grandfather    Kidney disease Mother    Clotting disorder Father    Sickle cell trait Father    Early death Father    ADVANCED DIRECTIVES (Y/N):  N  HEALTH MAINTENANCE: Social History   Tobacco Use   Smoking status: Never    Passive exposure: Never   Smokeless tobacco: Never  Vaping Use   Vaping status: Never Used  Substance Use Topics   Alcohol use: Yes    Alcohol/week: 1.0 standard drink of alcohol    Types: 1  Standard drinks or equivalent per week    Comment: Max numbers only   Drug use: Never    Colonoscopy:  PAP:  Bone density:  Lipid panel:  No Known Allergies  Current Outpatient Medications  Medication Sig Dispense Refill   citalopram  (CELEXA ) 20 MG tablet Take 1 tablet (20 mg total) by mouth  daily. Can increase to 40 mg after at least 1 week 60 tablet 1   gabapentin  (NEURONTIN ) 100 MG capsule Take 1 capsule (100 mg total) by mouth 3 (three) times daily as needed. 90 capsule 0   Iron -Vitamin C (VITRON-C) 65-125 MG TABS Take 1 tablet by mouth daily. 90 tablet 1   omeprazole  (PRILOSEC) 40 MG capsule TAKE 1 CAPSULE BY MOUTH EVERY DAY AS NEEDED 90 capsule 0   ondansetron  (ZOFRAN ) 4 MG tablet Take 1 tablet (4 mg total) by mouth every 6 (six) hours as needed for nausea or vomiting. 30 tablet 1   traZODone  (DESYREL ) 50 MG tablet Take 0.5-1 tablets (25-50 mg total) by mouth at bedtime as needed for sleep. 30 tablet 0   No current facility-administered medications for this visit.    OBJECTIVE: Vitals:   03/28/24 1041 03/28/24 1050  BP: (!) 147/86 136/84  Pulse: 68   Temp: 98.2 F (36.8 C)   SpO2: 100%       Body mass index is 40.51 kg/m.    ECOG FS:1 - Symptomatic but completely ambulatory  General: Well-developed, well-nourished, no acute distress. Fatigued appearing.  Lungs: No audible wheezing or coughing Abdomen: Soft, nontender, nondistended.  Musculoskeletal: No edema, cyanosis, or clubbing. Neuro: Alert, answering all questions appropriately. Cranial nerves grossly intact. Skin: not pale.  Psych: Normal affect.   LAB RESULTS: Lab Results  Component Value Date   WBC 6.2 12/15/2023   NEUTROABS 3.3 12/15/2023   HGB 8.3 (L) 12/15/2023   HCT 29.1 (L) 12/15/2023   MCV 73 (L) 12/15/2023   PLT 320 12/15/2023   Lab Results  Component Value Date   IRON  68 04/21/2023   TIBC 392 04/21/2023   IRONPCTSAT 17 04/21/2023   Lab Results  Component Value Date   FERRITIN 38 04/21/2023    STUDIES: No results found.  ASSESSMENT: Iron  deficiency anemia.  PLAN:    1.  Iron  deficiency anemia: etiology secondary to heavy menses. Last IV iron  09/26/22. Previously discussed switching from venofer  to monoferric but does not appear it was approved by insurance. She will continue  venofer . Hemoglobin dropped to 5.2 in April 2024. She's had close monitoring and venofer  outpatient. Last received venofer  02/10/23. Lost to follow up. MOst recent hemoglobin 8.3 with pcp. Plan to check labs today and plan for restarting venofer . Recommend venofer  x 5. She can continue Vitron-C but due to depleted iron  stores she will need IV iron  for now. She voices agreement.   2.  Sickle cell trait: No intervention needed.  She does not have any children at this time.  Likely the etiology of her mild baseline anemia.  3. Heavy menses- hx of fibroids. Previously saw Dr. Lake and discussed options for management in March 2022. S/p evaluation with Dr. Janit at [facility]. Now s/p emobolization with Dr Jama. Ongoing bleeding but improved.    Disposition: Venofer  x 5 (start asap) Add on labs today (cbc, bmp, ferritin, iron  studies, hold tube) 2 mo- lab (cbc, bmp, ferritin, iron  studies) Day to week later see me +/- venofer - la  I spent a total of 10 minutes reviewing chart data, face-to-face evaluation with  the patient, counseling and coordination of care as detailed above.  Patient expressed understanding and was in agreement with this plan. She also understands that She can call clinic at any time with any questions, concerns, or complaints.   Thank you for allowing me to participate in the care of this very pleasant patient.   Tinnie KANDICE Dawn, NP  03/28/2024

## 2024-04-01 ENCOUNTER — Ambulatory Visit: Payer: Self-pay | Admitting: Nurse Practitioner

## 2024-04-03 ENCOUNTER — Inpatient Hospital Stay

## 2024-04-03 VITALS — BP 147/84 | HR 62 | Temp 97.8°F

## 2024-04-03 DIAGNOSIS — D508 Other iron deficiency anemias: Secondary | ICD-10-CM

## 2024-04-03 DIAGNOSIS — D509 Iron deficiency anemia, unspecified: Secondary | ICD-10-CM | POA: Diagnosis not present

## 2024-04-03 MED ORDER — IRON SUCROSE 20 MG/ML IV SOLN
200.0000 mg | Freq: Once | INTRAVENOUS | Status: AC
  Administered 2024-04-03: 200 mg via INTRAVENOUS

## 2024-04-03 NOTE — Patient Instructions (Signed)

## 2024-04-05 ENCOUNTER — Inpatient Hospital Stay

## 2024-04-05 VITALS — BP 140/89 | HR 59 | Temp 98.5°F

## 2024-04-05 DIAGNOSIS — D509 Iron deficiency anemia, unspecified: Secondary | ICD-10-CM | POA: Diagnosis not present

## 2024-04-05 DIAGNOSIS — D508 Other iron deficiency anemias: Secondary | ICD-10-CM

## 2024-04-05 MED ORDER — IRON SUCROSE 20 MG/ML IV SOLN
200.0000 mg | Freq: Once | INTRAVENOUS | Status: AC
  Administered 2024-04-05: 200 mg via INTRAVENOUS
  Filled 2024-04-05: qty 10

## 2024-04-05 MED ORDER — SODIUM CHLORIDE 0.9% FLUSH
10.0000 mL | Freq: Once | INTRAVENOUS | Status: AC | PRN
  Administered 2024-04-05: 10 mL
  Filled 2024-04-05: qty 10

## 2024-04-09 ENCOUNTER — Inpatient Hospital Stay

## 2024-04-09 VITALS — BP 152/90 | HR 65 | Temp 99.1°F | Resp 16

## 2024-04-09 DIAGNOSIS — D509 Iron deficiency anemia, unspecified: Secondary | ICD-10-CM | POA: Diagnosis not present

## 2024-04-09 DIAGNOSIS — D508 Other iron deficiency anemias: Secondary | ICD-10-CM

## 2024-04-09 MED ORDER — IRON SUCROSE 20 MG/ML IV SOLN
200.0000 mg | Freq: Once | INTRAVENOUS | Status: AC
  Administered 2024-04-09: 16:00:00 200 mg via INTRAVENOUS
  Filled 2024-04-09: qty 10

## 2024-04-09 NOTE — Patient Instructions (Signed)

## 2024-04-11 ENCOUNTER — Inpatient Hospital Stay

## 2024-04-14 ENCOUNTER — Other Ambulatory Visit: Payer: Self-pay | Admitting: Family Medicine

## 2024-04-14 DIAGNOSIS — K219 Gastro-esophageal reflux disease without esophagitis: Secondary | ICD-10-CM

## 2024-04-16 ENCOUNTER — Inpatient Hospital Stay

## 2024-04-16 VITALS — BP 139/89 | HR 73 | Temp 97.0°F | Resp 18

## 2024-04-16 DIAGNOSIS — D508 Other iron deficiency anemias: Secondary | ICD-10-CM

## 2024-04-16 DIAGNOSIS — D509 Iron deficiency anemia, unspecified: Secondary | ICD-10-CM | POA: Diagnosis not present

## 2024-04-16 MED ORDER — SODIUM CHLORIDE 0.9% FLUSH
10.0000 mL | Freq: Once | INTRAVENOUS | Status: AC | PRN
  Administered 2024-04-16: 10 mL
  Filled 2024-04-16: qty 10

## 2024-04-16 MED ORDER — IRON SUCROSE 20 MG/ML IV SOLN
200.0000 mg | Freq: Once | INTRAVENOUS | Status: AC
  Administered 2024-04-16: 200 mg via INTRAVENOUS
  Filled 2024-04-16: qty 10

## 2024-04-17 NOTE — Telephone Encounter (Signed)
 Requested Prescriptions  Pending Prescriptions Disp Refills   omeprazole  (PRILOSEC) 40 MG capsule [Pharmacy Med Name: OMEPRAZOLE  DR 40 MG CAPSULE] 90 capsule 0    Sig: TAKE 1 CAPSULE BY MOUTH EVERY DAY AS NEEDED     Gastroenterology: Proton Pump Inhibitors Passed - 04/17/2024 10:26 AM      Passed - Valid encounter within last 12 months    Recent Outpatient Visits           1 month ago Prediabetes   [place] [facility], Selinda PARAS, MD   4 months ago Immunization due   [facility] Medicine at [facility], Selinda PARAS, MD       Future Appointments             In 8 months Ku, Selinda PARAS, MD [facility] Primary Care & [facility] Medicine at [facility], [phone] [place]

## 2024-04-29 ENCOUNTER — Inpatient Hospital Stay: Attending: Nurse Practitioner

## 2024-04-29 VITALS — BP 147/88 | HR 71 | Temp 97.5°F | Resp 18

## 2024-04-29 DIAGNOSIS — D508 Other iron deficiency anemias: Secondary | ICD-10-CM

## 2024-04-29 MED ORDER — IRON SUCROSE 20 MG/ML IV SOLN
200.0000 mg | Freq: Once | INTRAVENOUS | Status: AC
  Administered 2024-04-29: 200 mg via INTRAVENOUS
  Filled 2024-04-29: qty 10

## 2024-04-29 NOTE — Patient Instructions (Signed)

## 2024-05-14 ENCOUNTER — Telehealth (INDEPENDENT_AMBULATORY_CARE_PROVIDER_SITE_OTHER): Admitting: Family Medicine

## 2024-05-14 ENCOUNTER — Encounter: Payer: Self-pay | Admitting: Family Medicine

## 2024-05-14 DIAGNOSIS — F419 Anxiety disorder, unspecified: Secondary | ICD-10-CM | POA: Diagnosis not present

## 2024-05-14 DIAGNOSIS — F32A Depression, unspecified: Secondary | ICD-10-CM

## 2024-05-14 DIAGNOSIS — G4710 Hypersomnia, unspecified: Secondary | ICD-10-CM | POA: Insufficient documentation

## 2024-05-14 MED ORDER — CITALOPRAM HYDROBROMIDE 40 MG PO TABS: 40.0000 mg | ORAL_TABLET | Freq: Every day | ORAL | 0 refills | Status: AC

## 2024-05-14 NOTE — Progress Notes (Signed)
 "    Primary Care / Sports Medicine Virtual Visit  Patient Information:  Patient ID: Eileen Vargas, female DOB: 1991-01-28 Age: 34 y.o. MRN: [id number]   Novia Lansberry is a pleasant 34 y.o. female presenting with the following:  No chief complaint on file.   Review of Systems: No fevers, chills, night sweats, weight loss, chest pain, or shortness of breath.   Patient Active Problem List   Diagnosis Date Noted   Sleeping excessive 05/14/2024   Menorrhagia 12/28/2022   Symptomatic anemia 08/23/2022   Need for immunization against influenza 03/29/2022   Right lumbosacral radiculopathy 08/12/2021   Hyperlipidemia 02/08/2021   Anxiety and depression 07/13/2020   Gastroesophageal reflux disease without esophagitis 07/13/2020   BMI 40.0-44.9, adult (HCC) 07/13/2020   Fibroid, uterine 07/13/2020   Sickle cell trait 07/01/2020   Iron  deficiency anemia 06/06/2020   Past Medical History:  Diagnosis Date   Anemia    Clotting disorder 05/29/2020   Fiberoid in uterus   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   gabapentin  (NEURONTIN ) 100 MG capsule Take 1 capsule (100 mg total) by mouth 3 (three) times daily as needed.   Iron -Vitamin C (VITRON-C) 65-125 MG TABS Take 1 tablet by mouth daily.   omeprazole  (PRILOSEC) 40 MG capsule TAKE 1 CAPSULE BY MOUTH EVERY DAY AS NEEDED   ondansetron  (ZOFRAN ) 4 MG tablet Take 1 tablet (4 mg total) by mouth every 6 (six) hours as needed for nausea or vomiting.   traZODone  (DESYREL ) 50 MG tablet Take 0.5-1 tablets (25-50 mg total) by mouth at bedtime as needed for sleep.   [DISCONTINUED] citalopram  (CELEXA ) 20 MG tablet Take 1 tablet (20 mg total) by mouth daily. Can increase to 40 mg after at least 1 week   No facility-administered encounter medications on file as of 05/14/2024.   Past  Surgical History:  Procedure Laterality Date   CHOLECYSTECTOMY  2013   EMBOLIZATION (CATH LAB) N/A 06/13/2023   Procedure: EMBOLIZATION;  Surgeon: Jama Cordella MATSU, MD;  Location: [facility] INVASIVE CV LAB;  Service: Cardiovascular;  Laterality: N/A;   EXTRACORPOREAL SHOCK WAVE LITHOTRIPSY Left 12/01/2022   Procedure: EXTRACORPOREAL SHOCK WAVE LITHOTRIPSY (ESWL);  Surgeon: Penne Knee, MD;  Location: [facility] ORS;  Service: Urology;  Laterality: Left;    Discussed the use of AI scribe software for clinical note transcription with the patient, who gave verbal consent to proceed.   Virtual Visit via MyChart Video:   I connected with Dodson Job on 05/14/24 via MyChart Video and verified that I am speaking with the correct person using appropriate identifiers.   The limitations, risks, security and privacy concerns of performing an evaluation and management service by MyChart Video, including the higher likelihood of inaccurate diagnoses and treatments, and the availability of in person appointments were reviewed. The possible need of an additional face-to-face encounter for complete and high quality delivery of care was discussed. The patient was also made aware that there may be a patient responsible charge related to this service. The patient expressed understanding and wishes to proceed.  Provider location is in medical facility. Patient location is at their home, different from provider location. People involved in care of the patient during this telehealth encounter were myself, my nurse/medical assistant, and my front office/scheduling team member.  Objective findings:   General: Speaking  full sentences, no audible heavy breathing. Sounds alert and appropriately interactive. Well-appearing. Face symmetric. Extraocular movements intact. Pupils equal and round. No nasal flaring or accessory muscle use visualized.  Independent interpretation of notes and tests performed by another provider:    None  Pertinent History, Exam, Impression, and Recommendations:   Discussed the use of AI scribe software for clinical note transcription with the patient, who gave verbal consent to proceed.  History of Present Illness   Eileen Vargas is a 34 year old female with major depressive disorder, iron  deficiency anemia, and hypersomnia who presents for follow-up of mood and sleep symptoms.  Mood Disturbance: - Mood symptoms stable over the past two months - Citalopram  dose increased from 20 mg to 40 mg daily approximately six weeks ago after titration - No anhedonia, depressed mood, fatigue, poor appetite, feelings of worthlessness, difficulty concentrating, psychomotor changes, or suicidal ideation - No anxiety, excessive worry, restlessness, irritability, or fearfulness  Sleep Disturbance and Hypersomnia: - Trazodone  25 mg nightly has improved sleep quality - Continues to sleep 10-12 hours per night - Experiences sleepiness upon waking, though not fatigued - No difficulty initiating sleep - Hypersomnia present on several days in the past two weeks, somewhat impairing daily functioning - Maintains a consistent bedtime; oversleeping not attributed to delayed sleep onset - Occasional awakening with sensation of shallow breathing, prompting deep inspiration; episodes are infrequent but memorable  Iron  Deficiency Anemia: - Completed a course of iron  infusions, last infusion on April 29, 2024 - Subjective improvement in symptoms - Awaiting further hematology evaluation in February - Work-related fatigue improved since end of peak season at job after Christmas  Lifestyle Modification: - Recently initiated a gym exercise regimen, targeting three sessions per week - Satisfied with this lifestyle modification      Assessment and Plan    Hypersomnia Persistent hypersomnia despite improved anemia and controlled mood symptoms. Possible sleep apnea suspected due to perceived apnea  episodes. - Referred to sleep medicine for sleep apnea evaluation. - Instructed on home sleep study process. - Advised hematology follow-up in February.  Major depressive disorder Well-controlled on citalopram  and trazodone . No significant depressive or anxiety symptoms. - Continued citalopram  40 mg daily. - Continued trazodone  25 mg nightly. - Sent three-month citalopram  supply to pharmacy. - Advised to contact via MyChart for refills or earlier follow-up. - Recommended mood reassessment post-sleep study results.        Problem List Items Addressed This Visit     Sleeping excessive - Primary   Relevant Orders   Ambulatory referral to Sleep Studies     Orders & Medications Medications:  Meds ordered this encounter  Medications   citalopram  (CELEXA ) 40 MG tablet    Sig: Take 1 tablet (40 mg total) by mouth daily.    Dispense:  90 tablet    Refill:  0   Orders Placed This Encounter  Procedures   Ambulatory referral to Sleep Studies     I discussed the above assessment and treatment plan with the patient. The patient was provided an opportunity to ask questions and all were answered. The patient agreed with the plan and demonstrated an understanding of the instructions.   The patient was advised to call back or seek an in-person evaluation if the symptoms worsen or if the condition fails to improve as anticipated.   I provided a total time of 30 minutes including both face-to-face and non-face-to-face time on 05/14/2024 inclusive of time utilized for medical chart review, information gathering, care  coordination with staff, and documentation completion.    Selinda JINNY Ku, MD, [facility]   Primary Care Sports Medicine Primary Care and Sports Medicine at [facility]   "

## 2024-05-14 NOTE — Patient Instructions (Signed)
" °  VISIT SUMMARY: Today we discussed your mood and sleep symptoms. Your mood has been stable, and your sleep quality has improved with medication, but you continue to experience hypersomnia. We also reviewed your iron  deficiency anemia and recent lifestyle changes.  YOUR PLAN: HYPERSOMNIA: You continue to experience excessive sleepiness despite improved anemia and controlled mood symptoms. Possible sleep apnea is suspected. -You are referred to sleep medicine for a sleep apnea evaluation. -You will be instructed on the home sleep study process. -Follow up with hematology in February.  MAJOR DEPRESSIVE DISORDER: Your mood is well-controlled with citalopram  and trazodone . You have no significant depressive or anxiety symptoms. -Continue taking citalopram  40 mg daily. -Continue taking trazodone  25 mg nightly. -A three-month supply of citalopram  has been sent to your pharmacy. -Contact us  via MyChart for refills or if you need an earlier follow-up. -We will reassess your mood after the sleep study results are available.    Contains text generated by [employer].   "

## 2024-05-29 ENCOUNTER — Inpatient Hospital Stay

## 2024-05-29 DIAGNOSIS — D500 Iron deficiency anemia secondary to blood loss (chronic): Secondary | ICD-10-CM

## 2024-05-29 LAB — CBC WITH DIFFERENTIAL/PLATELET
Abs Immature Granulocytes: 0.01 10*3/uL (ref 0.00–0.07)
Basophils Absolute: 0 10*3/uL (ref 0.0–0.1)
Basophils Relative: 1 %
Eosinophils Absolute: 0.1 10*3/uL (ref 0.0–0.5)
Eosinophils Relative: 2 %
HCT: 37.4 % (ref 36.0–46.0)
Hemoglobin: 12.3 g/dL (ref 12.0–15.0)
Immature Granulocytes: 0 %
Lymphocytes Relative: 36 %
Lymphs Abs: 1.2 10*3/uL (ref 0.7–4.0)
MCH: 27.4 pg (ref 26.0–34.0)
MCHC: 32.9 g/dL (ref 30.0–36.0)
MCV: 83.3 fL (ref 80.0–100.0)
Monocytes Absolute: 0.4 10*3/uL (ref 0.1–1.0)
Monocytes Relative: 13 %
Neutro Abs: 1.6 10*3/uL — ABNORMAL LOW (ref 1.7–7.7)
Neutrophils Relative %: 48 %
Platelets: 227 10*3/uL (ref 150–400)
RBC: 4.49 MIL/uL (ref 3.87–5.11)
RDW: 16.1 % — ABNORMAL HIGH (ref 11.5–15.5)
WBC: 3.3 10*3/uL — ABNORMAL LOW (ref 4.0–10.5)
nRBC: 0 % (ref 0.0–0.2)

## 2024-05-29 LAB — IRON AND TIBC
Iron: 36 ug/dL (ref 28–170)
Saturation Ratios: 10 % — ABNORMAL LOW (ref 10.4–31.8)
TIBC: 354 ug/dL (ref 250–450)
UIBC: 319 ug/dL

## 2024-05-29 LAB — BASIC METABOLIC PANEL WITH GFR
Anion gap: 11 (ref 5–15)
BUN: 10 mg/dL (ref 6–20)
CO2: 25 mmol/L (ref 22–32)
Calcium: 9.2 mg/dL (ref 8.9–10.3)
Chloride: 105 mmol/L (ref 98–111)
Creatinine, Ser: 0.83 mg/dL (ref 0.44–1.00)
GFR, Estimated: >60 mL/min
Glucose, Bld: 98 mg/dL (ref 70–99)
Potassium: 4.1 mmol/L (ref 3.5–5.1)
Sodium: 140 mmol/L (ref 135–145)

## 2024-05-29 LAB — SAMPLE TO BLOOD BANK

## 2024-05-29 LAB — FERRITIN: Ferritin: 107 ng/mL (ref 11–307)

## 2024-05-30 ENCOUNTER — Ambulatory Visit: Payer: Self-pay | Admitting: Nurse Practitioner

## 2024-05-31 ENCOUNTER — Encounter: Payer: Self-pay | Admitting: Nurse Practitioner

## 2024-05-31 ENCOUNTER — Inpatient Hospital Stay

## 2024-05-31 ENCOUNTER — Inpatient Hospital Stay: Admitting: Nurse Practitioner

## 2024-05-31 VITALS — BP 111/75 | HR 72 | Temp 98.7°F | Resp 16 | Wt 237.0 lb

## 2024-05-31 VITALS — BP 143/83 | Resp 18

## 2024-05-31 DIAGNOSIS — D259 Leiomyoma of uterus, unspecified: Secondary | ICD-10-CM

## 2024-05-31 DIAGNOSIS — D508 Other iron deficiency anemias: Secondary | ICD-10-CM

## 2024-05-31 DIAGNOSIS — D500 Iron deficiency anemia secondary to blood loss (chronic): Secondary | ICD-10-CM

## 2024-05-31 MED ORDER — IRON SUCROSE 20 MG/ML IV SOLN
200.0000 mg | Freq: Once | INTRAVENOUS | Status: AC
  Administered 2024-05-31: 200 mg via INTRAVENOUS
  Filled 2024-05-31: qty 10

## 2024-05-31 NOTE — Patient Instructions (Signed)
 Iron  Sucrose Injection What is this medication? IRON  SUCROSE (EYE ern SOO krose) treats low levels of iron  (iron  deficiency anemia) in people with kidney disease. Iron  is a mineral that plays an important role in making red blood cells, which carry oxygen from your lungs to the rest of your body. This medicine may be used for other purposes; ask your health care provider or pharmacist if you have questions. COMMON BRAND NAME(S): Venofer  What should I tell my care team before I take this medication? They need to know if you have any of these conditions: Anemia not caused by low iron  levels Heart disease High levels of iron  in the blood Kidney disease Liver disease An unusual or allergic reaction to iron , other medications, foods, dyes, or preservatives Pregnant or trying to get pregnant Breastfeeding How should I use this medication? This medication is infused into a vein. It is given by your care team in a hospital or clinic setting. Talk to your care team about the use of this medication in children. While it may be prescribed for children as young as 2 years for selected conditions, precautions do apply. Overdosage: If you think you have taken too much of this medicine contact a poison control center or emergency room at once. NOTE: This medicine is only for you. Do not share this medicine with others. What if I miss a dose? Keep appointments for follow-up doses. It is important not to miss your dose. Call your care team if you are unable to keep an appointment. What may interact with this medication? Do not take this medication with any of the following: Deferoxamine Dimercaprol Other iron  products This medication may also interact with the following: Chloramphenicol Deferasirox This list may not describe all possible interactions. Give your health care provider a list of all the medicines, herbs, non-prescription drugs, or dietary supplements you use. Also tell them if you smoke,  drink alcohol, or use illegal drugs. Some items may interact with your medicine. What should I watch for while using this medication? Your condition will be monitored carefully while you are receiving this medication. Tell your care team if your symptoms do not start to get better or if they get worse. You may need blood work done while you are taking this medication. Sometimes, when medications are infused into veins, a little can leak out of the vein and into the tissue around it. If this medication leaks, it can cause a brown or dark stain on the skin. This is not common. It may be permanent. If you feel pain or swelling during your infusion, tell your care team right away. They can stop the infusion and treat the area. You may need to eat more foods that contain iron . Talk to your care team. Foods that contain iron  include whole grains or cereals, dried fruits, beans, peas, leafy green vegetables, and organ meats (liver, kidney). What side effects may I notice from receiving this medication? Side effects that you should report to your care team as soon as possible: Allergic reactions--skin rash, itching, hives, swelling of the face, lips, tongue, or throat Low blood pressure--dizziness, feeling faint or lightheaded, blurry vision Painful swelling, warmth, or redness of the skin, brown or dark skin color at the infusion site Shortness of breath Side effects that usually do not require medical attention (report these to your care team if they continue or are bothersome): Flushing Headache Joint pain Muscle pain Nausea This list may not describe all possible side effects. Call your  doctor for medical advice about side effects. You may report side effects to FDA at 1-800-FDA-1088. Where should I keep my medication? This medication is given in a hospital or clinic. It will not be stored at home. NOTE: This sheet is a summary. It may not cover all possible information. If you have questions about  this medicine, talk to your doctor, pharmacist, or health care provider.  2025 Elsevier/Gold Standard (2024-02-28 00:00:00)

## 2024-05-31 NOTE — Progress Notes (Signed)
 " [facility]  [phone] [phone]  ID: Eileen Vargas OB: [birth year]  MR#: [id number]  RDW#:[id number]  Patient Care Team: Alvia Selinda PARAS, MD as PCP - General (Family Medicine) Jacobo Evalene PARAS, MD as Consulting Physician (Oncology)  CHIEF COMPLAINT: Iron  deficiency anemia  INTERVAL HISTORY: Patient is a 34 year old female with history of iron  deficiency anemia secondary to heavy menses who returns to clinic for follow-up.  She has been receiving IV iron , tolerating well. Continues to have heavy periods. Denies rectal bleeding or blood in the urine. Energy has increased. Feeling better.   REVIEW OF SYSTEMS:   Review of Systems  Constitutional:  Negative for chills, fever, malaise/fatigue and weight loss.  Respiratory:  Negative for cough and shortness of breath.   Cardiovascular:  Negative for chest pain, palpitations, orthopnea and claudication.  Gastrointestinal:  Negative for abdominal pain, blood in stool, constipation, diarrhea, melena, nausea and vomiting.  Genitourinary:  Negative for frequency and hematuria.       Fibroids  Musculoskeletal:  Negative for back pain, joint pain, myalgias and neck pain.  Skin: Negative.   Neurological:  Negative for dizziness, tingling, weakness and headaches.  Endo/Heme/Allergies:  Does not bruise/bleed easily.  Psychiatric/Behavioral:  Negative for depression. The patient is not nervous/anxious.   As per HPI. Otherwise, a complete review of systems is negative.  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    Patient Active Problem List   Diagnosis Date Noted   Sleeping excessive 05/14/2024   Menorrhagia 12/28/2022   Symptomatic anemia 08/23/2022   Need for immunization against influenza 03/29/2022   Right lumbosacral radiculopathy 08/12/2021    Hyperlipidemia 02/08/2021   Anxiety and depression 07/13/2020   Gastroesophageal reflux disease without esophagitis 07/13/2020   BMI 40.0-44.9, adult (HCC) 07/13/2020   Fibroid, uterine 07/13/2020   Sickle cell trait 07/01/2020   Iron  deficiency anemia 06/06/2020   PAST SURGICAL HISTORY: Past Surgical History:  Procedure Laterality Date   CHOLECYSTECTOMY  2013   EMBOLIZATION (CATH LAB) N/A 06/13/2023   Procedure: EMBOLIZATION;  Surgeon: Jama Cordella MATSU, MD;  Location: [facility] INVASIVE CV LAB;  Service: Cardiovascular;  Laterality: N/A;   EXTRACORPOREAL SHOCK WAVE LITHOTRIPSY Left 12/01/2022   Procedure: EXTRACORPOREAL SHOCK WAVE LITHOTRIPSY (ESWL);  Surgeon: Penne Knee, MD;  Location: [facility] ORS;  Service: Urology;  Laterality: Left;   FAMILY HISTORY: Family History  Problem Relation Age of Onset   Diabetes Maternal Grandmother    Lung cancer Maternal Grandfather    Kidney disease Mother    Clotting disorder Father    Sickle cell trait Father    Early death Father    ADVANCED DIRECTIVES (Y/N):  N  HEALTH MAINTENANCE: Social History   Tobacco Use   Smoking status: Never    Passive exposure: Never   Smokeless tobacco: Never  Vaping Use   Vaping status: Never Used  Substance Use Topics   Alcohol use: Yes    Alcohol/week: 1.0 standard drink of alcohol    Types: 1 Standard drinks or equivalent per week    Comment: Max numbers only   Drug use: Never    Colonoscopy:  PAP:  Bone density:  Lipid panel:  No Known Allergies  Current Outpatient Medications  Medication Sig Dispense Refill   citalopram  (CELEXA ) 40 MG tablet Take 1 tablet (40 mg total) by  mouth daily. 90 tablet 0   gabapentin  (NEURONTIN ) 100 MG capsule Take 1 capsule (100 mg total) by mouth 3 (three) times daily as needed. 90 capsule 0   Iron -Vitamin C (VITRON-C) 65-125 MG TABS Take 1 tablet by mouth daily. 90 tablet 1   omeprazole  (PRILOSEC) 40 MG capsule TAKE 1 CAPSULE BY MOUTH EVERY DAY AS NEEDED 90  capsule 0   traZODone  (DESYREL ) 50 MG tablet Take 0.5-1 tablets (25-50 mg total) by mouth at bedtime as needed for sleep. 30 tablet 0   ondansetron  (ZOFRAN ) 4 MG tablet Take 1 tablet (4 mg total) by mouth every 6 (six) hours as needed for nausea or vomiting. 30 tablet 1   No current facility-administered medications for this visit.    OBJECTIVE: Vitals:   05/31/24 1012  BP: 111/75  Pulse: 72  Resp: 16  Temp: 98.7 F (37.1 C)  SpO2: 100%   Body mass index is 40.68 kg/m.    ECOG FS:1 - Symptomatic but completely ambulatory  General: Well-developed, well-nourished, no acute distress. Fatigued appearing.  Lungs: No audible wheezing or coughing Abdomen: Soft, nontender, nondistended.  Musculoskeletal: No edema, cyanosis, or clubbing. Neuro: Alert, answering all questions appropriately. Cranial nerves grossly intact. Skin: not pale.  Psych: Normal affect.   LAB RESULTS: Lab Results  Component Value Date   WBC 3.3 (L) 05/29/2024   NEUTROABS 1.6 (L) 05/29/2024   HGB 12.3 05/29/2024   HCT 37.4 05/29/2024   MCV 83.3 05/29/2024   PLT 227 05/29/2024   Lab Results  Component Value Date   IRON  36 05/29/2024   TIBC 354 05/29/2024   IRONPCTSAT 10 (L) 05/29/2024   Lab Results  Component Value Date   FERRITIN 107 05/29/2024    STUDIES: No results found.  ASSESSMENT: Iron  deficiency anemia.  PLAN:    1.  Iron  deficiency anemia: etiology secondary to heavy menses. April 2024- hmg 5.2. Monoferric was not covered by insurance. Last received IV venofer  04/29/24. Tolerates well. Hmg has improved to 12.3 (up from 8.3), ferritin 107 (up from 38), iron  sat 10% (up from 6%). Proceed with venofer  x 3. She can continue vitron-c.   2.  Sickle cell trait: No intervention needed.  She does not have any children at this time.  Likely the etiology of her mild baseline anemia.  3. Heavy menses- hx of fibroids. Previously saw Dr. Lake and discussed options for management in March 2022. S/p  evaluation with Dr. Janit at [facility]. Now s/p emobolization with Dr Jama. Ongoing bleeding but improved.  She requests referral back to [facility] for re-evaluation.   Disposition: Venofer  today 1 week- venofer  2 weeks- venofer  3 mo- lab (cbc, ferritin, iron  studies) D2- see me, +/- venofer - la  I spent a total of 10 minutes reviewing chart data, face-to-face evaluation with the patient, counseling and coordination of care as detailed above.  Patient expressed understanding and was in agreement with this plan. She also understands that She can call clinic at any time with any questions, concerns, or complaints.   Thank you for allowing me to participate in the care of this very pleasant patient.   Tinnie KANDICE Dawn, NP  05/31/2024   "

## 2024-06-07 ENCOUNTER — Inpatient Hospital Stay

## 2024-06-14 ENCOUNTER — Inpatient Hospital Stay

## 2024-08-29 ENCOUNTER — Inpatient Hospital Stay

## 2024-08-30 ENCOUNTER — Inpatient Hospital Stay

## 2024-08-30 ENCOUNTER — Inpatient Hospital Stay: Admitting: Nurse Practitioner

## 2024-12-17 ENCOUNTER — Encounter: Admitting: Family Medicine
